# Patient Record
Sex: Male | Born: 1940 | Race: White | Hispanic: No | Marital: Married | Smoking: Former smoker
Health system: Southern US, Community
[De-identification: ages and names within clinical notes are randomized; demographics above are authoritative.]

## PROBLEM LIST (undated history)

## (undated) DIAGNOSIS — C61 Malignant neoplasm of prostate: Secondary | ICD-10-CM

## (undated) DIAGNOSIS — M353 Polymyalgia rheumatica: Secondary | ICD-10-CM

## (undated) DIAGNOSIS — I4892 Unspecified atrial flutter: Secondary | ICD-10-CM

## (undated) DIAGNOSIS — E7800 Pure hypercholesterolemia, unspecified: Secondary | ICD-10-CM

## (undated) DIAGNOSIS — I420 Dilated cardiomyopathy: Secondary | ICD-10-CM

## (undated) DIAGNOSIS — G4733 Obstructive sleep apnea (adult) (pediatric): Secondary | ICD-10-CM

## (undated) DIAGNOSIS — D693 Immune thrombocytopenic purpura: Secondary | ICD-10-CM

## (undated) HISTORY — PX: TONSILLECTOMY: SUR1361

## (undated) HISTORY — DX: Obstructive sleep apnea (adult) (pediatric): G47.33

## (undated) HISTORY — DX: Dilated cardiomyopathy: I42.0

---

## 2014-12-18 DIAGNOSIS — C61 Malignant neoplasm of prostate: Secondary | ICD-10-CM | POA: Insufficient documentation

## 2014-12-22 HISTORY — PX: PROSTATECTOMY: SHX69

## 2015-04-30 ENCOUNTER — Inpatient Hospital Stay (HOSPITAL_COMMUNITY)
Admission: EM | Admit: 2015-04-30 | Discharge: 2015-05-05 | DRG: 274 | Disposition: A | Discharge status: Home / Self Care | Payer: PRIVATE HEALTH INSURANCE | Attending: Internal Medicine | Admitting: Internal Medicine

## 2015-04-30 ENCOUNTER — Encounter (HOSPITAL_COMMUNITY): Payer: Self-pay | Admitting: Family Medicine

## 2015-04-30 ENCOUNTER — Emergency Department (HOSPITAL_COMMUNITY): Payer: PRIVATE HEALTH INSURANCE

## 2015-04-30 DIAGNOSIS — T380X5A Adverse effect of glucocorticoids and synthetic analogues, initial encounter: Secondary | ICD-10-CM | POA: Diagnosis not present

## 2015-04-30 DIAGNOSIS — Z8546 Personal history of malignant neoplasm of prostate: Secondary | ICD-10-CM

## 2015-04-30 DIAGNOSIS — Z7982 Long term (current) use of aspirin: Secondary | ICD-10-CM

## 2015-04-30 DIAGNOSIS — R739 Hyperglycemia, unspecified: Secondary | ICD-10-CM | POA: Diagnosis not present

## 2015-04-30 DIAGNOSIS — Z7952 Long term (current) use of systemic steroids: Secondary | ICD-10-CM

## 2015-04-30 DIAGNOSIS — I428 Other cardiomyopathies: Secondary | ICD-10-CM | POA: Diagnosis present

## 2015-04-30 DIAGNOSIS — I4891 Unspecified atrial fibrillation: Secondary | ICD-10-CM | POA: Diagnosis present

## 2015-04-30 DIAGNOSIS — I4892 Unspecified atrial flutter: Secondary | ICD-10-CM | POA: Diagnosis not present

## 2015-04-30 DIAGNOSIS — Z87891 Personal history of nicotine dependence: Secondary | ICD-10-CM

## 2015-04-30 DIAGNOSIS — I483 Typical atrial flutter: Secondary | ICD-10-CM | POA: Diagnosis present

## 2015-04-30 DIAGNOSIS — M353 Polymyalgia rheumatica: Secondary | ICD-10-CM | POA: Diagnosis present

## 2015-04-30 DIAGNOSIS — R000 Tachycardia, unspecified: Secondary | ICD-10-CM | POA: Diagnosis present

## 2015-04-30 DIAGNOSIS — Z79899 Other long term (current) drug therapy: Secondary | ICD-10-CM | POA: Diagnosis not present

## 2015-04-30 DIAGNOSIS — E785 Hyperlipidemia, unspecified: Secondary | ICD-10-CM | POA: Diagnosis present

## 2015-04-30 DIAGNOSIS — I5021 Acute systolic (congestive) heart failure: Secondary | ICD-10-CM | POA: Insufficient documentation

## 2015-04-30 DIAGNOSIS — E7800 Pure hypercholesterolemia, unspecified: Secondary | ICD-10-CM | POA: Diagnosis present

## 2015-04-30 HISTORY — DX: Unspecified atrial flutter: I48.92

## 2015-04-30 HISTORY — DX: Polymyalgia rheumatica: M35.3

## 2015-04-30 HISTORY — DX: Malignant neoplasm of prostate: C61

## 2015-04-30 HISTORY — DX: Pure hypercholesterolemia, unspecified: E78.00

## 2015-04-30 LAB — BASIC METABOLIC PANEL
Anion gap: 9 (ref 5–15)
BUN: 12 mg/dL (ref 6–20)
CHLORIDE: 106 mmol/L (ref 101–111)
CO2: 23 mmol/L (ref 22–32)
CREATININE: 0.99 mg/dL (ref 0.61–1.24)
Calcium: 8.9 mg/dL (ref 8.9–10.3)
Glucose, Bld: 93 mg/dL (ref 65–99)
Potassium: 4 mmol/L (ref 3.5–5.1)
SODIUM: 138 mmol/L (ref 135–145)

## 2015-04-30 LAB — CBC
HCT: 45.1 % (ref 39.0–52.0)
Hemoglobin: 15 g/dL (ref 13.0–17.0)
MCH: 29.1 pg (ref 26.0–34.0)
MCHC: 33.3 g/dL (ref 30.0–36.0)
MCV: 87.6 fL (ref 78.0–100.0)
PLATELETS: 244 10*3/uL (ref 150–400)
RBC: 5.15 MIL/uL (ref 4.22–5.81)
RDW: 14.8 % (ref 11.5–15.5)
WBC: 10.9 10*3/uL — AB (ref 4.0–10.5)

## 2015-04-30 LAB — I-STAT TROPONIN, ED: TROPONIN I, POC: 0.01 ng/mL (ref 0.00–0.08)

## 2015-04-30 LAB — TSH: TSH: 2.553 u[IU]/mL (ref 0.350–4.500)

## 2015-04-30 MED ORDER — DILTIAZEM LOAD VIA INFUSION
10.0000 mg | Freq: Once | INTRAVENOUS | Status: AC
  Administered 2015-04-30: 10 mg via INTRAVENOUS
  Filled 2015-04-30: qty 10

## 2015-04-30 MED ORDER — DILTIAZEM HCL 100 MG IV SOLR
5.0000 mg/h | INTRAVENOUS | Status: DC
  Administered 2015-04-30: 5 mg/h via INTRAVENOUS
  Administered 2015-05-01: 12.5 mg/h via INTRAVENOUS
  Administered 2015-05-01: 5 mg/h via INTRAVENOUS
  Filled 2015-04-30 (×3): qty 100

## 2015-04-30 MED ORDER — DILTIAZEM HCL 25 MG/5ML IV SOLN
15.0000 mg | Freq: Once | INTRAVENOUS | Status: AC
  Administered 2015-04-30: 15 mg via INTRAVENOUS

## 2015-04-30 MED ORDER — HEPARIN BOLUS VIA INFUSION
3500.0000 [IU] | Freq: Once | INTRAVENOUS | Status: AC
  Administered 2015-04-30: 3500 [IU] via INTRAVENOUS
  Filled 2015-04-30: qty 3500

## 2015-04-30 MED ORDER — ADENOSINE 6 MG/2ML IV SOLN
6.0000 mg | Freq: Once | INTRAVENOUS | Status: AC
  Administered 2015-04-30: 6 mg via INTRAVENOUS
  Filled 2015-04-30: qty 2

## 2015-04-30 MED ORDER — HEPARIN (PORCINE) IN NACL 100-0.45 UNIT/ML-% IJ SOLN
1000.0000 [IU]/h | INTRAMUSCULAR | Status: DC
  Administered 2015-04-30: 1000 [IU]/h via INTRAVENOUS
  Filled 2015-04-30: qty 250

## 2015-04-30 NOTE — ED Notes (Signed)
Pt here for tachycardia. Sts he went for a checkup at his doctor and that's when they found it. Denies any symptoms.

## 2015-04-30 NOTE — ED Notes (Signed)
Pt receiving heparin bolus; 4-5 second pause noted to cardiac monitor, rate in 40-50s; pt c/o dizziness. Dr.Rees at bedside; cardizem stopped per EDP. A-flutter noted with a rate 60-70s.

## 2015-04-30 NOTE — ED Provider Notes (Signed)
CSN: [id number]     Arrival date & time 04/30/15  1751 History   First MD Initiated Contact with Patient 04/30/15 1809     Chief Complaint  Patient presents with  . Tachycardia    The history is provided by the patient and the spouse. No language interpreter was used.   [name] presents for evaluation of tachycardia. He was referred to the emergency department by his primary care provider for asymptomatic tachycardia. He saw his primary care provider for routine physical was noted to have a rapid heart rate at that time and sent to the emergency department. He denies any chest pain, shortness of breath, abdominal pain, leg swelling or pain, fevers. He is diagnosed prostate cancer and underwent prostatectomy back in May and has done well postoperatively. He was also diagnosed with polymyalgia rheumatica and started on steroids 2 months ago and takes 10 mg of prednisone daily. He's been exercising this week without difficulty. He recently returned from a trip to [place] 2 weeks ago.  Past Medical History  Diagnosis Date  . Cancer   . High cholesterol    Past Surgical History  Procedure Laterality Date  . Prostatectomy     History reviewed. No pertinent family history. Social History  Substance Use Topics  . Smoking status: Never Smoker   . Smokeless tobacco: None  . Alcohol Use: Yes    Review of Systems  All other systems reviewed and are negative.     Allergies  Review of patient's allergies indicates no known allergies.  Home Medications   Prior to Admission medications   Not on File   BP 121/91 mmHg  Pulse 155  Temp(Src) 97.7 F (36.5 C) (Oral)  Resp 16  SpO2 97% Physical Exam  Constitutional: He is oriented to person, place, and time. He appears well-developed and well-nourished.  HENT:  Head: Normocephalic and atraumatic.  Cardiovascular: Regular rhythm.   No murmur heard. Tachycardic  Pulmonary/Chest: Effort normal and breath sounds normal. No  respiratory distress.  Abdominal: Soft. There is no tenderness. There is no rebound and no guarding.  Musculoskeletal: He exhibits no edema or tenderness.  Neurological: He is alert and oriented to person, place, and time.  Skin: Skin is warm and dry.  Psychiatric: He has a normal mood and affect. His behavior is normal.  Nursing note and vitals reviewed.   ED Course  Procedures (including critical care time) CRITICAL CARE Performed by: Quintella Reichert   Total critical care time: 30 mintues  Critical care time was exclusive of separately billable procedures and treating other patients.  Critical care was necessary to treat or prevent imminent or life-threatening deterioration.  Critical care was time spent personally by me on the following activities: development of treatment plan with patient and/or surrogate as well as nursing, discussions with consultants, evaluation of patient's response to treatment, examination of patient, obtaining history from patient or surrogate, ordering and performing treatments and interventions, ordering and review of laboratory studies, ordering and review of radiographic studies, pulse oximetry and re-evaluation of patient's condition.  Labs Review Labs Reviewed  CBC - Abnormal; Notable for the following:    WBC 10.9 (*)    All other components within normal limits  MRSA PCR SCREENING  BASIC METABOLIC PANEL  TSH  CBC  HEPARIN LEVEL (UNFRACTIONATED)  TROPONIN I  TROPONIN I  TROPONIN I  T4, FREE  T3, FREE  COMPREHENSIVE METABOLIC PANEL  CBC WITH DIFFERENTIAL/PLATELET  Juan Shaffer, ED    Imaging Review  Dg Chest Portable 1 View  04/30/2015   CLINICAL DATA:  Tachycardia  EXAM: PORTABLE CHEST - 1 VIEW  COMPARISON:  None.  FINDINGS: The heart size and mediastinal contours are within normal limits. Both lungs are clear. The visualized skeletal structures are unremarkable. Pacing pads overlie the chest.  IMPRESSION: No active disease.    Electronically Signed   By: Conchita Paris M.D.   On: 04/30/2015 20:02   I have personally reviewed and evaluated these images and lab results as part of my medical decision-making.   EKG Interpretation   Date/Time:  Wednesday April 30 2015 17:57:22 EDT Ventricular Rate:  153 PR Interval:    QRS Duration: 120 QT Interval:  250 QTC Calculation: 399 R Axis:   -9 Text Interpretation:  Atrial flutter Right bundle branch block Abnormal  ECG Confirmed by Juan Shaffer [phone]) on 04/30/2015 8:51:23 PM      MDM   Final diagnoses:  Atrial flutter, unspecified   Patient here for evaluation of a symptomatic tachycardia from his primary care provider's office. Initial EKG with tachycardia, thought to be a flutter given regularity. Patient given a card 6 mg IV push 1 with underlying rhythm of atrial flutter identified. He was then started on Cardizem for rate control. He was initially given a 10 mg bolus and started on a drip of 5 mg an hour. He did not have improvement in his rate at that time. The drip was increased to 10 mg an hour he was given an additional bolus of 15 mg once with an improvement in his rate to around 100. He did transiently become bradycardic to the 50s with a brief period of sinus activity and the Cardizem drip was discontinued. Discussed with Cardiologist, Dr. Claiborne Billings,  who will see the patient in consult. Discussed with the hospitalist service regarding admission for further management. Patient was started on heparin drip for possible TEE with cardioversion tomorrow.  Quintella Reichert, MD 05/01/15 [phone]

## 2015-04-30 NOTE — ED Notes (Signed)
Spoke with pharmacy unable to obtain medication out of pyxis. They will verify medication and send to POD E via tube station.

## 2015-04-30 NOTE — Consult Note (Signed)
PHARMACY CONSULT NOTE  Pharmacy Consult :  Heparin Indication : Atrial Flutter   Allergies: No Known Allergies  Heparin Dosing weight : 70.3 kg  Vital Signs: BP 114/77 mmHg  Pulse 75  Temp(Src) 97.7 F (36.5 C) (Oral)  Resp 16  Ht 5\' 9"  (1.753 m)  Wt 155 lb (70.308 kg)  BMI 22.88 kg/m2  SpO2 96%  Active Problems: Active Problems:   Atrial flutter   Labs:  Recent Labs  04/30/15 1909  HGB 15.0  HCT 45.1  PLT 244  CREATININE 0.99   No results found for: INR, PROTIME Estimated Creatinine Clearance: 64.1 mL/min (by C-G formula based on Cr of 0.99).  Medical / Surgical History: Past Medical History  Diagnosis Date  . Cancer   . High cholesterol    Past Surgical History  Procedure Laterality Date  . Prostatectomy      MEDICATION: Medication PTA: pending Scheduled:  Scheduled:  . heparin  3,500 Units Intravenous Once   Infusion[s]: Infusions:  . diltiazem (CARDIZEM) infusion 12.5 mg/hr (04/30/15 2038)  . heparin  1000 units/hr   Antibiotic[s]: Anti-infectives    None      ASSESSMENT:  74 y.o. male admitted for evaluation of tachycardia noted on routine physical exam.  Patient noted to have Atrial Flutter.  Heparin per Pharmacy Consult to be started as VTE prophylaxis   GOAL:  Heparin Level  0.3 - 0.7 units/ml   PLAN: 1. Heparin bolus 3500 units IV now, then begin Heparin infusion at 1000 units/hr.  The next Heparin Level will be drawn with the AM labs.    2. Daily Heparin level, CBC while on Heparin.  Monitor for bleeding complications. Follow Platelet counts.  Marthenia Rolling,  Pharm.D   04/30/2015,  9:35 PM

## 2015-04-30 NOTE — Consult Note (Signed)
Reason for Consult: new onset atrial flutter Primary Cardiologist: new Referring Physician: Dr. Johny Shaffer is an 74 y.o. male.  HPI: Juan Shaffer is a 74 yo man with PMH of prostate cancer s/p prostatectomy, dyslipidemia and recent diagnosis of polymyalgia rheumatica on steroids who presented to the ER after being seen by his PCP for physical with findings of tachycardia. He was having a routine physical when tachycardia noted on exam. He is asymptomatic and has not noticed limitations in his exercise ability or any chest pain or dyspnea recently. He had his prostatectomy in May and has done well. He also was diagnosed with polymyalgia rheumatica approximately 2 months ago and treated with 10 mg prednisone daily. He remains active with frequent hiking and swimming and he continues to exercise. He remains asymptomatic. We spoke at length with him, his wife and daughter about atrial fibrillation and atrial flutter and the risk of stroke with the associated conditions.   He recently travelled to [place] returned ~ 2 weeks ago.    He received diltiazem gtt and bolus in the ER and heart-rate has improved. He was also started on heparin gtt for anticogulation.     Past Medical History  Diagnosis Date  . Cancer   . High cholesterol     Past Surgical History  Procedure Laterality Date  . Prostatectomy      History reviewed. No pertinent family history. No known family history of atrial flutter or fibrillation  Social History:  reports that he has never smoked. He does not have any smokeless tobacco history on file. He reports that he drinks alcohol. He reports that he does not use illicit drugs.  Allergies: No Known Allergies  Medications:  I have reviewed the patient's current medications. Prior to Admission:  (Not in a hospital admission) Scheduled: . heparin  3,500 Units Intravenous Once    Results for orders placed or performed during the hospital encounter of  04/30/15 (from the past 48 hour(s))  I-Stat Troponin, ED (not at [facility])     Status: None   Collection Time: 04/30/15  6:59 PM  Result Value Ref Range   Troponin i, poc 0.01 0.00 - 0.08 ng/mL   Comment 3            Comment: Due to the release kinetics of cTnI, a negative result within the first hours of the onset of symptoms does not rule out myocardial infarction with certainty. If myocardial infarction is still suspected, repeat the test at appropriate intervals.   Basic metabolic panel     Status: None   Collection Time: 04/30/15  7:09 PM  Result Value Ref Range   Sodium 138 135 - 145 mmol/L   Potassium 4.0 3.5 - 5.1 mmol/L   Chloride 106 101 - 111 mmol/L   CO2 23 22 - 32 mmol/L   Glucose, Bld 93 65 - 99 mg/dL   BUN 12 6 - 20 mg/dL   Creatinine, Ser 0.99 0.61 - 1.24 mg/dL   Calcium 8.9 8.9 - 10.3 mg/dL   GFR calc non Af Amer >60 >60 mL/min   GFR calc Af Amer >60 >60 mL/min    Comment: (NOTE) The eGFR has been calculated using the CKD EPI equation. This calculation has not been validated in all clinical situations. eGFR's persistently <60 mL/min signify possible Chronic Kidney Disease.    Anion gap 9 5 - 15  CBC     Status: Abnormal   Collection Time: 04/30/15  7:09 PM  Result Value Ref Range   WBC 10.9 (H) 4.0 - 10.5 K/uL   RBC 5.15 4.22 - 5.81 MIL/uL   Hemoglobin 15.0 13.0 - 17.0 g/dL   HCT 45.1 39.0 - 52.0 %   MCV 87.6 78.0 - 100.0 fL   MCH 29.1 26.0 - 34.0 pg   MCHC 33.3 30.0 - 36.0 g/dL   RDW 14.8 11.5 - 15.5 %   Platelets 244 150 - 400 K/uL  TSH     Status: None   Collection Time: 04/30/15  7:09 PM  Result Value Ref Range   TSH 2.553 0.350 - 4.500 uIU/mL    Dg Chest Portable 1 View  04/30/2015   CLINICAL DATA:  Tachycardia  EXAM: PORTABLE CHEST - 1 VIEW  COMPARISON:  None.  FINDINGS: The heart size and mediastinal contours are within normal limits. Both lungs are clear. The visualized skeletal structures are unremarkable. Pacing pads overlie the chest.   IMPRESSION: No active disease.   Electronically Signed   By: Conchita Paris M.D.   On: 04/30/2015 20:02    Review of Systems  Constitutional: Negative for fever, chills and weight loss.  HENT: Negative for ear discharge, ear pain and tinnitus.   Eyes: Negative for double vision, photophobia and pain.  Respiratory: Negative for cough, hemoptysis and sputum production.   Gastrointestinal: Negative for nausea, vomiting and abdominal pain.  Genitourinary: Negative for dysuria, urgency and frequency.  Musculoskeletal: Negative for myalgias, back pain and neck pain.  Skin: Negative for rash.  Neurological: Negative for dizziness, tingling, tremors and headaches.  Endo/Heme/Allergies: Negative for polydipsia. Does not bruise/bleed easily.  Psychiatric/Behavioral: Negative for depression, suicidal ideas and substance abuse.   Blood pressure 114/77, pulse 75, temperature 97.7 F (36.5 C), temperature source Oral, resp. rate 16, height 5' 9" (1.753 m), weight 70.308 kg (155 lb), SpO2 96 %. Physical Exam  Nursing note and vitals reviewed. Constitutional: He is oriented to person, place, and time. He appears well-developed and well-nourished. No distress.  HENT:  Head: Normocephalic and atraumatic.  Nose: Nose normal.  Mouth/Throat: Oropharynx is clear and moist. No oropharyngeal exudate.  Eyes: Conjunctivae and EOM are normal. Pupils are equal, round, and reactive to light. No scleral icterus.  Neck: Normal range of motion. Neck supple. No JVD present. No tracheal deviation present.  Cardiovascular: Normal heart sounds and intact distal pulses.  Exam reveals no gallop.   No murmur heard. Occasionally irregular, largely regular rate/rhythm with flutter waves on monitor  Respiratory: Effort normal and breath sounds normal. No respiratory distress. He has no wheezes. He has no rales.  GI: Soft. Bowel sounds are normal. He exhibits no distension. There is no tenderness. There is no rebound.    Musculoskeletal: Normal range of motion. He exhibits no edema or tenderness.  Neurological: He is alert and oriented to person, place, and time. No cranial nerve deficit.  Skin: Skin is warm and dry. No rash noted. He is not diaphoretic. No erythema.  Psychiatric: He has a normal mood and affect. His behavior is normal. Thought content normal.   Labs reviewed; Cr 1, K 4, TSH 2.55, EKG reviewed; atrial flutter ~ 153 HR, RBBB  Assessment/Plan: Mr. Lowdermilk is a 74 yo man with PMH of prostate cancer s/p prostatectomy, dyslipidemia and recent diagnosis of polymyalgia rheumatica on steroids who presented to the ER after being seen by his PCP for physical with findings of tachycardia. He has atrial flutter. Triggers and etiology for atrial flutter CTI typical flutter, post-surgical left-sided  flutter, triggers of infection, heart failure, thyroid disease, ischemia, medications/drugs/toxins among many other etiologies. He is being rate controlled with diltiazem now and anticoagulated with heparin. Will plan for TEE-DCCV in AM and have EP see him for possible ablation to prevent recurrence.  1. Atrial flutter with RVR: anticoagulation with heparin gtt, rate control with diltiazem. Likely will pursue TEE-DCCV in AM.  - Keep NPO after MN. Evaluate for triggers - TSH, BNP, echocardiogram, trend cardiac biomarkers - discuss with EP  2. Dyslipidemia: continue asa and atorvastatin 3. PMR: appreciate Hospitalist team 4. S/p Prostatectomy: appreciate hospitalist team  Jules Husbands 04/30/2015, 9:34 PM

## 2015-04-30 NOTE — ED Notes (Addendum)
Pt c/o tachycardia after being sent by PCP. Pt reports having a wellness visit schedule today, staff noticed pt's heart rate to be in the 150s and referred pt to ED. Pt reports having a prostatectomy in May and has been taking a "low dose of cialis;" denies any new medications

## 2015-04-30 NOTE — ED Notes (Signed)
Pt stood to use urinal; no change in heart rate; denies dizziness, chest pain, or shortness of breath

## 2015-04-30 NOTE — ED Notes (Signed)
Attempted report 

## 2015-04-30 NOTE — ED Notes (Signed)
Patient placed on zoll  

## 2015-04-30 NOTE — ED Notes (Signed)
Cardiologist at bedside.  

## 2015-04-30 NOTE — ED Notes (Signed)
15 mg bolus given of cardizem; heart rate noted to be irregular with a rate of 100-110

## 2015-05-01 ENCOUNTER — Encounter (HOSPITAL_COMMUNITY): Payer: Self-pay | Admitting: General Practice

## 2015-05-01 ENCOUNTER — Inpatient Hospital Stay (HOSPITAL_COMMUNITY): Payer: PRIVATE HEALTH INSURANCE

## 2015-05-01 ENCOUNTER — Other Ambulatory Visit (HOSPITAL_COMMUNITY): Payer: PRIVATE HEALTH INSURANCE

## 2015-05-01 DIAGNOSIS — M353 Polymyalgia rheumatica: Secondary | ICD-10-CM | POA: Diagnosis present

## 2015-05-01 DIAGNOSIS — Z8546 Personal history of malignant neoplasm of prostate: Secondary | ICD-10-CM

## 2015-05-01 DIAGNOSIS — I4892 Unspecified atrial flutter: Secondary | ICD-10-CM

## 2015-05-01 DIAGNOSIS — E7800 Pure hypercholesterolemia, unspecified: Secondary | ICD-10-CM | POA: Diagnosis present

## 2015-05-01 DIAGNOSIS — E785 Hyperlipidemia, unspecified: Secondary | ICD-10-CM

## 2015-05-01 LAB — CBC WITH DIFFERENTIAL/PLATELET
BASOS PCT: 0 % (ref 0–1)
Basophils Absolute: 0 10*3/uL (ref 0.0–0.1)
EOS ABS: 0.1 10*3/uL (ref 0.0–0.7)
EOS PCT: 1 % (ref 0–5)
HCT: 44.7 % (ref 39.0–52.0)
Hemoglobin: 14.8 g/dL (ref 13.0–17.0)
LYMPHS ABS: 1 10*3/uL (ref 0.7–4.0)
Lymphocytes Relative: 9 % — ABNORMAL LOW (ref 12–46)
MCH: 29.2 pg (ref 26.0–34.0)
MCHC: 33.1 g/dL (ref 30.0–36.0)
MCV: 88.2 fL (ref 78.0–100.0)
MONO ABS: 0.7 10*3/uL (ref 0.1–1.0)
MONOS PCT: 7 % (ref 3–12)
NEUTROS PCT: 83 % — AB (ref 43–77)
Neutro Abs: 9 10*3/uL — ABNORMAL HIGH (ref 1.7–7.7)
PLATELETS: 241 10*3/uL (ref 150–400)
RBC: 5.07 MIL/uL (ref 4.22–5.81)
RDW: 15 % (ref 11.5–15.5)
WBC: 10.9 10*3/uL — ABNORMAL HIGH (ref 4.0–10.5)

## 2015-05-01 LAB — HEPARIN LEVEL (UNFRACTIONATED): Heparin Unfractionated: 0.57 IU/mL (ref 0.30–0.70)

## 2015-05-01 LAB — TROPONIN I
TROPONIN I: 0.03 ng/mL (ref <0.031)
Troponin I: <0.03 ng/mL (ref <0.031)
Troponin I: <0.03 ng/mL (ref <0.031)

## 2015-05-01 LAB — COMPREHENSIVE METABOLIC PANEL
ALK PHOS: 45 U/L (ref 38–126)
ALT: 31 U/L (ref 17–63)
AST: 19 U/L (ref 15–41)
Albumin: 3.1 g/dL — ABNORMAL LOW (ref 3.5–5.0)
Anion gap: 9 (ref 5–15)
BUN: 9 mg/dL (ref 6–20)
CALCIUM: 8.2 mg/dL — AB (ref 8.9–10.3)
CHLORIDE: 106 mmol/L (ref 101–111)
CO2: 24 mmol/L (ref 22–32)
CREATININE: 0.95 mg/dL (ref 0.61–1.24)
GFR calc non Af Amer: >60 mL/min (ref >60)
GLUCOSE: 109 mg/dL — AB (ref 65–99)
Potassium: 3.7 mmol/L (ref 3.5–5.1)
SODIUM: 139 mmol/L (ref 135–145)
Total Bilirubin: 1 mg/dL (ref 0.3–1.2)
Total Protein: 5.6 g/dL — ABNORMAL LOW (ref 6.5–8.1)

## 2015-05-01 LAB — GLUCOSE, CAPILLARY
GLUCOSE-CAPILLARY: 110 mg/dL — AB (ref 65–99)
Glucose-Capillary: 187 mg/dL — ABNORMAL HIGH (ref 65–99)
Glucose-Capillary: 167 mg/dL — ABNORMAL HIGH (ref 65–99)

## 2015-05-01 LAB — MRSA PCR SCREENING: MRSA BY PCR: NEGATIVE

## 2015-05-01 LAB — T4, FREE: Free T4: 1.05 ng/dL (ref 0.61–1.12)

## 2015-05-01 MED ORDER — THIAMINE HCL 100 MG/ML IJ SOLN: 100.0000 mg | Freq: Every day | INTRAMUSCULAR | Status: DC

## 2015-05-01 MED ORDER — LORAZEPAM 2 MG/ML IJ SOLN: 1.0000 mg | Freq: Four times a day (QID) | INTRAMUSCULAR | Status: DC | PRN

## 2015-05-01 MED ORDER — SODIUM CHLORIDE 0.9 % IJ SOLN: 3.0000 mL | Freq: Two times a day (BID) | INTRAMUSCULAR | Status: DC

## 2015-05-01 MED ORDER — LORAZEPAM 1 MG PO TABS: 1.0000 mg | ORAL_TABLET | Freq: Four times a day (QID) | ORAL | Status: DC | PRN

## 2015-05-01 MED ORDER — VITAMIN B-1 100 MG PO TABS
100.0000 mg | ORAL_TABLET | Freq: Every day | ORAL | Status: DC
  Administered 2015-05-01: 100 mg via ORAL
  Filled 2015-05-01: qty 1

## 2015-05-01 MED ORDER — RIVAROXABAN 20 MG PO TABS
20.0000 mg | ORAL_TABLET | ORAL | Status: AC
  Administered 2015-05-01: 20 mg via ORAL
  Filled 2015-05-01: qty 1

## 2015-05-01 MED ORDER — DILTIAZEM HCL 60 MG PO TABS
60.0000 mg | ORAL_TABLET | Freq: Three times a day (TID) | ORAL | Status: DC
  Administered 2015-05-01 – 2015-05-02 (×2): 60 mg via ORAL
  Filled 2015-05-01 (×2): qty 1

## 2015-05-01 MED ORDER — SODIUM CHLORIDE 0.9 % IV SOLN
INTRAVENOUS | Status: DC
Start: 2015-05-01 — End: 2015-05-05

## 2015-05-01 MED ORDER — ONDANSETRON HCL 4 MG PO TABS: 4.0000 mg | ORAL_TABLET | Freq: Four times a day (QID) | ORAL | Status: DC | PRN

## 2015-05-01 MED ORDER — ADULT MULTIVITAMIN W/MINERALS CH
1.0000 | ORAL_TABLET | Freq: Every day | ORAL | Status: DC
  Administered 2015-05-01 – 2015-05-04 (×4): 1 via ORAL
  Filled 2015-05-01 (×4): qty 1

## 2015-05-01 MED ORDER — ACETAMINOPHEN 650 MG RE SUPP: 650.0000 mg | Freq: Four times a day (QID) | RECTAL | Status: DC | PRN

## 2015-05-01 MED ORDER — SODIUM CHLORIDE 0.9 % IJ SOLN
3.0000 mL | Freq: Two times a day (BID) | INTRAMUSCULAR | Status: DC
  Administered 2015-05-01 – 2015-05-04 (×7): 3 mL via INTRAVENOUS

## 2015-05-01 MED ORDER — MORPHINE SULFATE (PF) 2 MG/ML IV SOLN: 1.0000 mg | INTRAVENOUS | Status: DC | PRN

## 2015-05-01 MED ORDER — HYDROCORTISONE NA SUCCINATE PF 100 MG IJ SOLR
50.0000 mg | Freq: Three times a day (TID) | INTRAMUSCULAR | Status: DC
  Administered 2015-05-01 – 2015-05-02 (×4): 50 mg via INTRAVENOUS
  Filled 2015-05-01 (×4): qty 2

## 2015-05-01 MED ORDER — SODIUM CHLORIDE 0.9 % IV SOLN
INTRAVENOUS | Status: DC
  Administered 2015-05-01: 1000 mL via INTRAVENOUS

## 2015-05-01 MED ORDER — ONDANSETRON HCL 4 MG/2ML IJ SOLN: 4.0000 mg | Freq: Four times a day (QID) | INTRAMUSCULAR | Status: DC | PRN

## 2015-05-01 MED ORDER — SODIUM CHLORIDE 0.9 % IJ SOLN: 3.0000 mL | INTRAMUSCULAR | Status: DC | PRN

## 2015-05-01 MED ORDER — FOLIC ACID 1 MG PO TABS
1.0000 mg | ORAL_TABLET | Freq: Every day | ORAL | Status: DC
  Administered 2015-05-01: 1 mg via ORAL
  Filled 2015-05-01: qty 1

## 2015-05-01 MED ORDER — SODIUM CHLORIDE 0.9 % IV SOLN: 250.0000 mL | INTRAVENOUS | Status: DC

## 2015-05-01 MED ORDER — RIVAROXABAN 20 MG PO TABS
20.0000 mg | ORAL_TABLET | Freq: Every day | ORAL | Status: DC
  Administered 2015-05-02 – 2015-05-03 (×2): 20 mg via ORAL
  Filled 2015-05-01 (×2): qty 1

## 2015-05-01 MED ORDER — ACETAMINOPHEN 325 MG PO TABS
650.0000 mg | ORAL_TABLET | Freq: Four times a day (QID) | ORAL | Status: DC | PRN
Start: 2015-05-01 — End: 2015-05-06

## 2015-05-01 MED ORDER — RIVAROXABAN 20 MG PO TABS: 20.0000 mg | ORAL_TABLET | Freq: Every day | ORAL | Status: DC

## 2015-05-01 NOTE — H&P (Signed)
[facility] History and Physical  Juan Shaffer OFB:[id number] DOB: 1940/10/19 DOA: 04/30/2015  Referring physician: Dr. Ralene Bathe. PCP: Velna Ochs, MD  Specialists: Dr. Charlestine Night. Rheumatologist.  Chief Complaint: Referred to the ER for tachycardia.  HPI: Juan Shaffer is a 74 y.o. male with history of polymyalgia rheumatica recently diagnosed and is being placed on prednisone, prostate cancer status post surgery in May 2016, hyperlipidemia was referred to the ER patient had followed up with his PCP for routine checkup. At the clinic patient was found to be tachycardic with rate around 150 bpm. Patient was found to be in atrial flutter with RVR. Patient was started on Cardizem infusion for which patient's heart rate improved. On-call cardiologist Dr. Christa See was consulted. Patient was started on heparin infusion and admitted for further management. Patient denies any palpitations chest pain shortness of breath. Patient states 2 weeks ago when he was traveling to [place] he felt like his heart was racing. Denies any nausea vomiting abdominal pain diarrhea fever chills productive cough.   Review of Systems: As presented in the history of presenting illness, rest negative.  Past Medical History  Diagnosis Date  . High cholesterol   . Polymyalgia rheumatica   . Prostate cancer    Past Surgical History  Procedure Laterality Date  . Prostatectomy  12/2014  . Tonsillectomy  ~ 1946   Social History:  reports that he quit smoking about 47 years ago. His smoking use included Cigarettes. He has a 8 pack-year smoking history. He has never used smokeless tobacco. He reports that he drinks about 3.6 oz of alcohol per week. He reports that he does not use illicit drugs. Where does patient live at home. Can patient participate in ADLs? Yes.  No Known Allergies  Family History: History reviewed. No pertinent family history. history of hypertension.   Prior to Admission medications    Medication Sig Start Date End Date Taking? Authorizing Provider  alendronate (FOSAMAX) 70 MG tablet Take 70 mg by mouth once a week. Take with a full glass of water on an empty stomach. Sundays (Days vary per patient). He did states he taken it yesterday (on a Tuesday) per patient   Yes Historical Provider, MD  aspirin 81 MG tablet Take 81 mg by mouth daily.   Yes Historical Provider, MD  atorvastatin (LIPITOR) 20 MG tablet Take 20 mg by mouth every evening.    Yes Historical Provider, MD  calcium carbonate (TUMS - DOSED IN MG ELEMENTAL CALCIUM) 500 MG chewable tablet Chew 1 tablet by mouth 2 (two) times daily.   Yes Historical Provider, MD  Cholecalciferol (VITAMIN D-3 PO) Take 1 tablet by mouth daily.   Yes Historical Provider, MD  clobetasol cream (TEMOVATE) 7.82 % Apply 1 application topically 2 (two) times daily.   Yes Historical Provider, MD  glucosamine-chondroitin 500-400 MG tablet Take 1 tablet by mouth daily.   Yes Historical Provider, MD  ibuprofen (ADVIL,MOTRIN) 600 MG tablet Take 600 mg by mouth every 6 (six) hours as needed for moderate pain.   Yes Historical Provider, MD  omega-3 acid ethyl esters (LOVAZA) 1 G capsule Take 1 g by mouth daily.   Yes Historical Provider, MD  oxyCODONE-acetaminophen (PERCOCET/ROXICET) 5-325 MG per tablet Take 1 tablet by mouth every 4 (four) hours as needed for moderate pain or severe pain.   Yes Historical Provider, MD  predniSONE (DELTASONE) 5 MG tablet Take 5 mg by mouth daily with breakfast.   Yes Historical Provider, MD  tadalafil (CIALIS)  5 MG tablet Take 5 mg by mouth daily. Take every day per patient   Yes Historical Provider, MD    Physical Exam: Filed Vitals:   04/30/15 2242 04/30/15 2300 04/30/15 2320 04/30/15 2346  BP: 112/66 112/67 132/76 123/68  Pulse: 67 75 75 57  Temp:    97.6 F (36.4 C)  TempSrc:    Oral  Resp: 16 12 14 16   Height:    5\' 9"  (1.753 m)  Weight:    71.215 kg (157 lb)  SpO2: 98% 98% 97% 98%     General:   Moderately built and nourished.  Eyes: Anicteric no pallor.  ENT: No discharge from the ears eyes nose and mouth.  Neck: No mass felt. No JVD appreciated.  Cardiovascular: S1 and S2 heard.  Respiratory: No rhonchi or crepitations.  Abdomen: Soft nontender bowel sounds present.  Skin: No rash.  Musculoskeletal: No edema.  Psychiatric: Appears normal.  Neurologic: Alert awake oriented to time place and person. Moves all extremities.  Labs on Admission:  Basic Metabolic Panel:  Recent Labs Lab 04/30/15 1909  NA 138  K 4.0  CL 106  CO2 23  GLUCOSE 93  BUN 12  CREATININE 0.99  CALCIUM 8.9   Liver Function Tests: No results for input(s): AST, ALT, ALKPHOS, BILITOT, PROT, ALBUMIN in the last 168 hours. No results for input(s): LIPASE, AMYLASE in the last 168 hours. No results for input(s): AMMONIA in the last 168 hours. CBC:  Recent Labs Lab 04/30/15 1909  WBC 10.9*  HGB 15.0  HCT 45.1  MCV 87.6  PLT 244   Cardiac Enzymes: No results for input(s): CKTOTAL, CKMB, CKMBINDEX, TROPONINI in the last 168 hours.  BNP (last 3 results) No results for input(s): BNP in the last 8760 hours.  ProBNP (last 3 results) No results for input(s): PROBNP in the last 8760 hours.  CBG: No results for input(s): GLUCAP in the last 168 hours.  Radiological Exams on Admission: Dg Chest Portable 1 View  04/30/2015   CLINICAL DATA:  Tachycardia  EXAM: PORTABLE CHEST - 1 VIEW  COMPARISON:  None.  FINDINGS: The heart size and mediastinal contours are within normal limits. Both lungs are clear. The visualized skeletal structures are unremarkable. Pacing pads overlie the chest.  IMPRESSION: No active disease.   Electronically Signed   By: Conchita Paris M.D.   On: 04/30/2015 20:02    EKG: Independently reviewed. Atrial flutter with RVR.  Assessment/Plan Principal Problem:   Atrial flutter with rapid ventricular response Active Problems:   Atrial flutter   Polymyalgia rheumatica    History of prostate cancer   Hyperlipidemia   1. Atrial flutter with RVR - appreciate cardiology consult. Patient started at this time is being controlled on Cardizem infusion which will be continued. As per cardiology recommendation patient has been kept nothing by mouth past midnight for possible cardiac procedure. Continue heparin infusion. Cycle cardiac markers and check thyroid function tests. Check 2-D echo. Chads 2 vasc score is 2. 2. Polymyalgia rheumatica - since patient will be nothing by mouth I have placed patient on IV hydrocortisone for now. Change back to prednisone once patient can take orally. 3. History of hyperlipidemia on statins. 4. History of prostate cancer status post surgery.  Patient states he drinks wine and liquor alternate days. Will keep on when necessary Ativan.  I have personally reviewed patient's chest x-ray and EKG. I have reviewed cardiology consult and their recommendations.   DVT Prophylaxis heparin infusion.  Code  Status: Full code.  Family Communication: Discussed with patient.  Disposition Plan: Admit to inpatient.    KAKRAKANDY,ARSHAD N. [facility] Pager [phone].  If 7PM-7AM, please contact night-coverage [email or web address] Password [email or web address] 05/01/2015, 1:03 AM

## 2015-05-01 NOTE — Progress Notes (Signed)
Patient admitted after midnight, please see H&P.  Cards consult for new a fib/flutter  Eulogio Bear DO

## 2015-05-01 NOTE — Progress Notes (Signed)
  Echocardiogram 2D Echocardiogram has been performed.  Juan Shaffer 05/01/2015, 9:56 AM

## 2015-05-01 NOTE — Care Management Note (Addendum)
Case Management Note  Patient Details  Name: Juan Shaffer MRN: [id number] Date of Birth: [birth year]  Subjective/Objective: Pt admitted for A Fib RVR. Initiated on IV Cardizem gtt.                   Action/Plan: CM will continue to monitor for medication assistance.    Expected Discharge Date:                  Expected Discharge Plan:  Home/Self Care  In-House Referral:     Discharge planning Services  CM Consult, Medication Assistance  Post Acute Care Choice:    Choice offered to:     DME Arranged:    DME Agency:     HH Arranged:    HH Agency:     Status of Service:  In process, will continue to follow  Medicare Important Message Given:    Date Medicare IM Given:    Medicare IM give by:    Date Additional Medicare IM Given:    Additional Medicare Important Message give by:     If discussed at [place] of Stay Meetings, dates discussed:    Additional Comments: [place] 05-02-15  Jacqlyn Krauss, RN,BSN [phone] Prior authorization completed. CM did provide co pay to pt. He has $100.00 deductible and co pay will range from zero to $45.00. Pt is aware. No further needs from CM at this time.   1116 05-02-15 Jacqlyn Krauss, [place] [phone] Xarelto:Xarelto: Needs Prior Approval - MD please [id number] option 2 then option 1Price can then be quoted to MD. CM did provide pt with 30 day free card for Xarelto. Pt will need Rx for 30 day free no refills. CM will call [facility] on [address] to make sure medication is available-medication is available. No further needs from CM at this time.

## 2015-05-01 NOTE — Progress Notes (Signed)
ANTICOAGULATION CONSULT NOTE - Follow Up Consult  Pharmacy Consult for Heparin Indication: atrial flutter  No Known Allergies  Patient Measurements: Height: 5\' 9"  (175.3 cm) Weight: 157 lb (71.215 kg) IBW/kg (Calculated) : 70.7  Vital Signs: Temp: 97.5 F (36.4 C) (09/08 0345) Temp Source: Oral (09/08 0345) BP: 121/83 mmHg (09/08 0645) Pulse Rate: 152 (09/08 0645)  Labs:  Recent Labs  04/30/15 1909 05/01/15 0310 05/01/15 0516  HGB 15.0  --  14.8  HCT 45.1  --  44.7  PLT 244  --  241  HEPARINUNFRC  --   --  0.57  CREATININE 0.99  --   --   TROPONINI  --  <0.03  --     Estimated Creatinine Clearance: 64.5 mL/min (by C-G formula based on Cr of 0.99).   Assessment: 74 y.o. male on heparin for atrial flutter. Heparin level therapeutic (0.57) on 1000 units/hr. No bleeding noted.  Goal of Therapy:  Heparin level 0.3-0.7 units/ml Monitor platelets by anticoagulation protocol: Yes   Plan:  Continue heparin at 1000 units/hr Will f/u 6 hr level to confirm therapeutic  Sherlon Handing, PharmD, BCPS Clinical pharmacist, pager [phone] 05/01/2015,7:00 AM

## 2015-05-01 NOTE — Progress Notes (Signed)
Pt educated and has refused to have bed alarm placed on overnight. Will continue to monitor closely. 

## 2015-05-01 NOTE — Progress Notes (Signed)
Utilization review completed. Krissy Orebaugh, RN, BSN. 

## 2015-05-01 NOTE — Progress Notes (Signed)
Patient Name: Juan Shaffer Date of Encounter: 05/01/2015  Principal Problem:   Atrial flutter with rapid ventricular response Active Problems:   Atrial flutter   Polymyalgia rheumatica   History of prostate cancer   Hyperlipidemia     Primary Cardiologist: New Patient Profile: 74yo male w/ PMH of prostate cancer (s/p prostatectomy), HLD, and PMR (on daily steroids) admitted on 04/30/2015 for Atrial Flutter w/ RVR.  SUBJECTIVE: Feeling well. Denies any chest pain, palpitations, or shortness of breath.  OBJECTIVE Filed Vitals:   05/01/15 0345 05/01/15 0445 05/01/15 0545 05/01/15 0645  BP: 115/58 110/70 127/76 121/83  Pulse: 76 38 76 152  Temp: 97.5 F (36.4 C)     TempSrc: Oral     Resp: 12 18 17 14   Height:      Weight: 157 lb (71.215 kg)     SpO2: 98% 96% 96% 94%    Intake/Output Summary (Last 24 hours) at 05/01/15 0858 Last data filed at 05/01/15 0403  Gross per 24 hour  Intake    170 ml  Output   1350 ml  Net  -1180 ml   Filed Weights   04/30/15 2035 04/30/15 2346 05/01/15 0345  Weight: 155 lb (70.308 kg) 157 lb (71.215 kg) 157 lb (71.215 kg)    PHYSICAL EXAM General: Well developed, well nourished, pleasant male in no acute distress. Head: Normocephalic, atraumatic.  Neck: Supple without bruits, JVD not elevated. Lungs:  Resp regular and unlabored, CTA without wheezing or rales. Heart: Irregularly irregular, no S3, S4, or murmur; no rub. Abdomen: Soft, non-tender, non-distended with normoactive bowel sounds. No hepatomegaly. No rebound/guarding. No obvious abdominal masses. Extremities: No clubbing, cyanosis, or edema. Distal pedal pulses are 2+ bilaterally. Neuro: Alert and oriented X 3. Moves all extremities spontaneously. Psych: Normal affect.   LABS: CBC: Recent Labs  04/30/15 1909 05/01/15 0516  WBC 10.9* 10.9*  NEUTROABS  --  9.0*  HGB 15.0 14.8  HCT 45.1 44.7  MCV 87.6 88.2  PLT 244 875   Basic Metabolic Panel: Recent Labs  04/30/15 1909 05/01/15 0516  NA 138 139  K 4.0 3.7  CL 106 106  CO2 23 24  GLUCOSE 93 109*  BUN 12 9  CREATININE 0.99 0.95  CALCIUM 8.9 8.2*   Liver Function Tests: Recent Labs  05/01/15 0516  AST 19  ALT 31  ALKPHOS 45  BILITOT 1.0  PROT 5.6*  ALBUMIN 3.1*   Cardiac Enzymes: Recent Labs  05/01/15 0310 05/01/15 0516  TROPONINI <0.03 0.03    Recent Labs  04/30/15 1859  TROPIPOC 0.01   Thyroid Function Tests: Recent Labs  04/30/15 1909  TSH 2.553    TELE:  Atrial Flutter with rates between 80 - 150bpm.      ECG: Atrial flutter with rate in 110's.  ECHO: Pending  Radiology/Studies: Dg Chest Portable 1 View: 04/30/2015   CLINICAL DATA:  Tachycardia  EXAM: PORTABLE CHEST - 1 VIEW  COMPARISON:  None.  FINDINGS: The heart size and mediastinal contours are within normal limits. Both lungs are clear. The visualized skeletal structures are unremarkable. Pacing pads overlie the chest.  IMPRESSION: No active disease.   Electronically Signed   By: Conchita Paris M.D.   On: 04/30/2015 20:02     Current Medications:  . folic acid  1 mg Oral Daily  . hydrocortisone sod succinate (SOLU-CORTEF) inj  50 mg Intravenous Q8H  . multivitamin with minerals  1 tablet Oral Daily  . thiamine  100 mg Oral Daily   Or  . thiamine  100 mg Intravenous Daily   . sodium chloride 10 mL/hr at 05/01/15 0102  . diltiazem (CARDIZEM) infusion 10 mg/hr (05/01/15 0810)  . heparin 1,000 Units/hr (04/30/15 2230)    ASSESSMENT AND PLAN:  1. Atrial flutter with rapid ventricular response - history of tachycardia diagnosed by PCP but has remained asymptomatic. TSH and Troponin values have been normal. - echocardiogram is pending. - currently on Diltiazem for rate control and Heparin drip for anticoagulation. - Scheduled for TEE/DCCV with Dr. Harrington Challenger on 05/02/2015 at 8:00AM. The risks and benefits of transesophageal echocardiogram have been explained including risks of esophageal damage,  perforation (1:10,000 risk), bleeding, pharyngeal hematoma as well as other potential complications associated with conscious sedation including aspiration, arrhythmia, respiratory failure and death. Alternatives to treatment were discussed, questions were answered. Patient is willing to proceed.   2. Polymyalgia rheumatica - per IM    3. History of prostate cancer - s/p prostatectomy 12/2014.  4. Hyperlipidemia - continue statin  Franchot Heidelberg , PA-C 8:58 AM 05/01/2015 Pager: [phone]

## 2015-05-01 NOTE — Progress Notes (Signed)
Pt cardizem restarted at 5mg /hr upon arrival to floor. Pt in A.flutter with rates in 70's while lying in bed. Heartrate rises to 150s a.flutter when pt stands at bedside to use urinal. Pt does not feel difference in heartrates. Will continue to monitor closely.

## 2015-05-02 ENCOUNTER — Encounter (HOSPITAL_COMMUNITY): Payer: Self-pay | Admitting: Student

## 2015-05-02 ENCOUNTER — Encounter (HOSPITAL_COMMUNITY)
Admission: EM | Disposition: A | Discharge status: Home / Self Care | Payer: Self-pay | Source: Home / Self Care | Attending: Internal Medicine

## 2015-05-02 DIAGNOSIS — M353 Polymyalgia rheumatica: Secondary | ICD-10-CM

## 2015-05-02 LAB — T3, FREE: T3, Free: 3.3 pg/mL (ref 2.0–4.4)

## 2015-05-02 LAB — CBC
HEMATOCRIT: 41.2 % (ref 39.0–52.0)
HEMOGLOBIN: 13.7 g/dL (ref 13.0–17.0)
MCH: 29.3 pg (ref 26.0–34.0)
MCHC: 33.3 g/dL (ref 30.0–36.0)
MCV: 88 fL (ref 78.0–100.0)
PLATELETS: 207 10*3/uL (ref 150–400)
RBC: 4.68 MIL/uL (ref 4.22–5.81)
RDW: 15.2 % (ref 11.5–15.5)
WBC: 13.8 10*3/uL — AB (ref 4.0–10.5)

## 2015-05-02 LAB — GLUCOSE, CAPILLARY
GLUCOSE-CAPILLARY: 114 mg/dL — AB (ref 65–99)
GLUCOSE-CAPILLARY: 162 mg/dL — AB (ref 65–99)
Glucose-Capillary: 136 mg/dL — ABNORMAL HIGH (ref 65–99)
Glucose-Capillary: 160 mg/dL — ABNORMAL HIGH (ref 65–99)

## 2015-05-02 SURGERY — ECHOCARDIOGRAM, TRANSESOPHAGEAL
Anesthesia: Monitor Anesthesia Care

## 2015-05-02 MED ORDER — METOPROLOL TARTRATE 12.5 MG HALF TABLET: 12.5000 mg | ORAL_TABLET | Freq: Two times a day (BID) | ORAL | Status: DC

## 2015-05-02 MED ORDER — HYDROCORTISONE NA SUCCINATE PF 100 MG IJ SOLR: 50.0000 mg | Freq: Two times a day (BID) | INTRAMUSCULAR | Status: DC

## 2015-05-02 MED ORDER — METOPROLOL TARTRATE 1 MG/ML IV SOLN
5.0000 mg | Freq: Once | INTRAVENOUS | Status: AC
  Administered 2015-05-02: 5 mg via INTRAVENOUS
  Filled 2015-05-02: qty 5

## 2015-05-02 MED ORDER — PREDNISONE 20 MG PO TABS
40.0000 mg | ORAL_TABLET | Freq: Every day | ORAL | Status: DC
  Administered 2015-05-02 – 2015-05-05 (×4): 40 mg via ORAL
  Filled 2015-05-02 (×4): qty 2

## 2015-05-02 MED ORDER — METOPROLOL TARTRATE 12.5 MG HALF TABLET
12.5000 mg | ORAL_TABLET | Freq: Two times a day (BID) | ORAL | Status: DC
  Administered 2015-05-02 – 2015-05-03 (×3): 12.5 mg via ORAL
  Filled 2015-05-02 (×3): qty 1

## 2015-05-02 MED ORDER — ATORVASTATIN CALCIUM 20 MG PO TABS
20.0000 mg | ORAL_TABLET | Freq: Every day | ORAL | Status: DC
  Administered 2015-05-03 – 2015-05-05 (×4): 20 mg via ORAL
  Filled 2015-05-02 (×4): qty 1

## 2015-05-02 NOTE — Progress Notes (Signed)
Notified Dr. Eliseo Squires pt back in [place] 130'-140's. Await cardiology input. Carroll Kinds RN

## 2015-05-02 NOTE — Progress Notes (Signed)
Patient Name: Juan Shaffer Date of Encounter: 05/02/2015  Principal Problem:   Atrial flutter with rapid ventricular response Active Problems:   Atrial flutter   Polymyalgia rheumatica   History of prostate cancer   Hyperlipidemia     Primary Cardiologist: New - Dr. Debara Pickett Patient Profile: 74yo male w/ PMH of prostate cancer (s/p prostatectomy), HLD, and PMR (on daily steroids) admitted on 04/30/2015 for Atrial Flutter w/ RVR. Converted back to sinus rhythm around 1600 on 05/01/2015.  SUBJECTIVE: Feeling well. Denies any chest pain, shortness of breath, or palpitations.  OBJECTIVE Filed Vitals:   05/02/15 0310 05/02/15 0505 05/02/15 0605 05/02/15 0607  BP:  110/50    Pulse: 71 60    Temp:   97.3 F (36.3 C)   TempSrc:   Oral   Resp:  15    Height:      Weight:    156 lb 11.2 oz (71.079 kg)  SpO2:  95%      Intake/Output Summary (Last 24 hours) at 05/02/15 0822 Last data filed at 05/02/15 7510  Gross per 24 hour  Intake   1080 ml  Output   1575 ml  Net   -495 ml   Filed Weights   04/30/15 2346 05/01/15 0345 05/02/15 0607  Weight: 157 lb (71.215 kg) 157 lb (71.215 kg) 156 lb 11.2 oz (71.079 kg)    PHYSICAL EXAM General: Well developed, well nourished, male in no acute distress. Head: Normocephalic, atraumatic.  Neck: Supple without bruits, JVD not elevated. Lungs:  Resp regular and unlabored, CTA without wheezing or rales. Heart: RRR, S1, S2, no S3, S4, or murmur; no rub. Abdomen: Soft, non-tender, non-distended with normoactive bowel sounds. No hepatomegaly. No rebound/guarding. No obvious abdominal masses. Extremities: No clubbing, cyanosis, or edema. Distal pedal pulses are 2+ bilaterally. Neuro: Alert and oriented X 3. Moves all extremities spontaneously. Psych: Normal affect.  LABS: CBC:  Recent Labs  05/01/15 0516 05/02/15 0526  WBC 10.9* 13.8*  NEUTROABS 9.0*  --   HGB 14.8 13.7  HCT 44.7 41.2  MCV 88.2 88.0  PLT 241 258   Basic Metabolic  Panel:  Recent Labs  04/30/15 1909 05/01/15 0516  NA 138 139  K 4.0 3.7  CL 106 106  CO2 23 24  GLUCOSE 93 109*  BUN 12 9  CREATININE 0.99 0.95  CALCIUM 8.9 8.2*   Liver Function Tests:  Recent Labs  05/01/15 0516  AST 19  ALT 31  ALKPHOS 45  BILITOT 1.0  PROT 5.6*  ALBUMIN 3.1*   Cardiac Enzymes:  Recent Labs  05/01/15 0310 05/01/15 0516 05/01/15 1240  TROPONINI <0.03 0.03 <0.03    Recent Labs  04/30/15 1859  TROPIPOC 0.01   Thyroid Function Tests:  Recent Labs  04/30/15 1909 05/01/15 0310  TSH 2.553  --   T3FREE  --  3.3    TELE: Converted from Atrial Flutter to NSR around 1600 on 05/01/2015. Has been in NSR since with rates now in 60's -70's.      ECG: NSR with rate in 60's.   ECHO: Study Conclusions - Left ventricle: The cavity size was normal. Wall thickness was increased in a pattern of moderate LVH. Systolic function was mildly reduced. The estimated ejection fraction was in the range of 45% to 50%. Wall motion was normal; there were no regional wall motion abnormalities. - Pericardium, extracardiac: A trivial pericardial effusion was identified.   Radiology/Studies: Dg Chest Portable 1 View: 04/30/2015  CLINICAL DATA:  Tachycardia  EXAM: PORTABLE CHEST - 1 VIEW  COMPARISON:  None.  FINDINGS: The heart size and mediastinal contours are within normal limits. Both lungs are clear. The visualized skeletal structures are unremarkable. Pacing pads overlie the chest.  IMPRESSION: No active disease.   Electronically Signed   By: Conchita Paris M.D.   On: 04/30/2015 20:02     Current Medications:  . diltiazem  60 mg Oral 3 times per day  . folic acid  1 mg Oral Daily  . hydrocortisone sod succinate (SOLU-CORTEF) inj  50 mg Intravenous Q8H  . multivitamin with minerals  1 tablet Oral Daily  . rivaroxaban  20 mg Oral Daily  . sodium chloride  3 mL Intravenous Q12H  . thiamine  100 mg Oral Daily   Or  . thiamine  100 mg Intravenous  Daily   . sodium chloride 1,000 mL (05/01/15 1412)  . sodium chloride    . sodium chloride    . diltiazem (CARDIZEM) infusion Stopped (05/01/15 2145)    ASSESSMENT AND PLAN: 1. Atrial flutter with rapid ventricular response - history of tachycardia diagnosed by PCP but has remained asymptomatic. TSH and Troponin values have been normal. - Echo showed EF of 45-50% with no wall motion abnormalities. - Switched from IV Cardizem to oral Cardizem 60mg  TID during the night of 05/01/2015. Consider switching Cardizem to oral BB due to low EF on recent Echocardiogram. - Consider outpatient ischemic evaluation to rule out underlying causes of Atrial Flutter and low EF. - This patients CHA2DS2-VASc Score and unadjusted Ischemic Stroke Rate (% per year) is equal to 3.2 % stroke rate/year from a score of 3 (CHF with reduced EF of 45-50%, Age > 75). Unsure of DM (Elevated glucose while in hospital, no A1c on file). Was switched to Xarelto yesterday and Case Management consult has been placed.  2. Polymyalgia rheumatica - per IM - has been on Prednisone 10mg  daily as an outpatient. Could account for elevated glucose noted during this admission.   3. History of prostate cancer - s/p prostatectomy 12/2014.  4. Hyperlipidemia - continue statin    Franchot Heidelberg , PA-C 8:22 AM 05/02/2015 Pager: [phone]

## 2015-05-02 NOTE — Progress Notes (Signed)
PROGRESS NOTE  Juan Shaffer JEH:[id number] DOB: [birth year] DOA: 04/30/2015 PCP: Velna Ochs, MD  Assessment/Plan:  Atrial flutter with rapid ventricular response- converted to sinus on 9/8 TSH and Troponin values have been normal. - Echo showed EF of 45-50% with no wall motion abnormalities. - metoprolol 12,5 mG BID- first dose this evening - xarelto has been prior authed for 1 year by me  Polymyalgia rheumatica - change from stress dose to PO prednisone and taper back to 10 mg -  on Prednisone 10mg  daily as an outpatient.   Hyperglycemia -due to steroids   History of prostate cancer - s/p prostatectomy 12/2014.  Hyperlipidemia - continue statin  Code Status: full Family Communication: patient Disposition Plan:    Consultants:  cards  Procedures:     HPI/Subjective: Converted on own back to sinus last PM  Objective: Filed Vitals:   05/02/15 0605  BP:   Pulse:   Temp: 97.3 F (36.3 C)  Resp:     Intake/Output Summary (Last 24 hours) at 05/02/15 1307 Last data filed at 05/02/15 1009  Gross per 24 hour  Intake    723 ml  Output   1075 ml  Net   -352 ml   Filed Weights   04/30/15 2346 05/01/15 0345 05/02/15 0607  Weight: 71.215 kg (157 lb) 71.215 kg (157 lb) 71.079 kg (156 lb 11.2 oz)    Exam:   General:  Awake, NAD  Cardiovascular: rrr  Respiratory: clear  Abdomen: +BS, soft  Musculoskeletal: no edema   Data Reviewed: Basic Metabolic Panel:  Recent Labs Lab 04/30/15 1909 05/01/15 0516  NA 138 139  K 4.0 3.7  CL 106 106  CO2 23 24  GLUCOSE 93 109*  BUN 12 9  CREATININE 0.99 0.95  CALCIUM 8.9 8.2*   Liver Function Tests:  Recent Labs Lab 05/01/15 0516  AST 19  ALT 31  ALKPHOS 45  BILITOT 1.0  PROT 5.6*  ALBUMIN 3.1*   No results for input(s): LIPASE, AMYLASE in the last 168 hours. No results for input(s): AMMONIA in the last 168 hours. CBC:  Recent Labs Lab 04/30/15 1909 05/01/15 0516 05/02/15 0526    WBC 10.9* 10.9* 13.8*  NEUTROABS  --  9.0*  --   HGB 15.0 14.8 13.7  HCT 45.1 44.7 41.2  MCV 87.6 88.2 88.0  PLT 244 241 207   Cardiac Enzymes:  Recent Labs Lab 05/01/15 0310 05/01/15 0516 05/01/15 1240  TROPONINI <0.03 0.03 <0.03   BNP (last 3 results) No results for input(s): BNP in the last 8760 hours.  ProBNP (last 3 results) No results for input(s): PROBNP in the last 8760 hours.  CBG:  Recent Labs Lab 05/01/15 1118 05/01/15 1623 05/02/15 0038 05/02/15 0623 05/02/15 1156  GLUCAP 187* 167* 160* 136* 114*    Recent Results (from the past 240 hour(s))  MRSA PCR Screening     Status: None   Collection Time: 05/01/15 12:09 AM  Result Value Ref Range Status   MRSA by PCR NEGATIVE NEGATIVE Final    Comment:        The GeneXpert MRSA Assay (FDA approved for NASAL specimens only), is one component of a comprehensive MRSA colonization surveillance program. It is not intended to diagnose MRSA infection nor to guide or monitor treatment for MRSA infections.      Studies: Dg Chest Portable 1 View  04/30/2015   CLINICAL DATA:  Tachycardia  EXAM: PORTABLE CHEST - 1 VIEW  COMPARISON:  None.  FINDINGS:  The heart size and mediastinal contours are within normal limits. Both lungs are clear. The visualized skeletal structures are unremarkable. Pacing pads overlie the chest.  IMPRESSION: No active disease.   Electronically Signed   By: Conchita Paris M.D.   On: 04/30/2015 20:02    Scheduled Meds: . metoprolol tartrate  12.5 mg Oral BID  . multivitamin with minerals  1 tablet Oral Daily  . predniSONE  40 mg Oral Q breakfast  . rivaroxaban  20 mg Oral Daily  . sodium chloride  3 mL Intravenous Q12H   Continuous Infusions: . sodium chloride 1,000 mL (05/01/15 1412)  . sodium chloride    . sodium chloride     Antibiotics Given (last 72 hours)    None      Principal Problem:   Atrial flutter with rapid ventricular response Active Problems:   Atrial flutter    Polymyalgia rheumatica   History of prostate cancer   Hyperlipidemia    Time spent: 25 min    VANN, JESSICA  [facility] Pager [phone]. If 7PM-7AM, please contact night-coverage at [email or web address], password [facility] 05/02/2015, 1:07 PM  LOS: 2 days

## 2015-05-02 NOTE — Progress Notes (Signed)
Patient continues to be in atrial flutter with heart rate primarily ranging from 120-140's.  Lopressor 5mg  given IV push with little effect at about 1953.  Text paged cardiology with all information in this note.

## 2015-05-02 NOTE — Progress Notes (Signed)
Paged by nurse that HR still 130s-140s. Asymptomatic. Got metoprolol 12.5mg  about 5pm. He occasionally comes down to 3:1 conduction once in a while but HR mostly 2:1 conduction. BP now 120s/70s. Will try IV metoprolol. This may be difficult to control until he gets back in NSR. There is nursing report that he went too slow with IV cardizem previously so will take cautious approach since he is asymptomatic. He was transitioned to BB today because of LV dysfunction. Jasen Hartstein PA-C

## 2015-05-02 NOTE — Progress Notes (Signed)
RN noted patient in normal sinus rhythm.  Cardiology paged, Wandra Mannan returned page and was updated.  New orders received.

## 2015-05-02 NOTE — Progress Notes (Signed)
Notified patient is back in atrial flutter.  Advised to give scheduled b-blocker dose now. Will ask EP to consult - he may be a good flutter ablation candidate.  Hold on discharge today.  Pixie Casino, MD, [facility] Attending Cardiologist [place]

## 2015-05-02 NOTE — Progress Notes (Signed)
RN spoke with Rosaria Ferries, on call for cardiology this morning pertaining to patient current rhythm Normal Sinus.  EKG completed last night to confirm rhythm.  Suanne Marker Barrett stated ok to cancel patient's TEE with cardioversion today and associated orders.

## 2015-05-02 NOTE — Progress Notes (Signed)
When RN spoke with Juan Shaffer earlier on the phone he stated to stop Cardizem infusion one hour after giving first oral dose of Cardizem.

## 2015-05-02 NOTE — Progress Notes (Signed)
Patient at 1635, flipped into flutter on the monitor, EKG done, and in chart, Called and spoke to [place], [place] with cardiology, ordered 12.5 mg lopressor now and pput in for a EP consult in the Am. Dr. Eliseo Squires was also notified and updated. Will continue to monitor patient. Current VS, BP 162/117, HR 129.Patient asymptamatic.

## 2015-05-02 NOTE — Discharge Instructions (Addendum)
Information on my medicine - Pradaxa (dabigatran)  This medication education was reviewed with me or my healthcare representative as part of my discharge preparation.  The pharmacist that spoke with me during my hospital stay was:  Linda Hedges, [facility]  Why was Pradaxa prescribed for you? Pradaxa was prescribed for you to reduce the risk of forming blood clots that cause a stroke if you have a medical condition called atrial fibrillation (a type of irregular heartbeat).    What do you Need to know about PradAXa? Take your Pradaxa TWICE DAILY - one capsule in the morning and one tablet in the evening with or without food.  It would be best to take the doses about the same time each day.  The capsules should not be broken, chewed or opened - they must be swallowed whole.  Do not store Pradaxa in other medication containers - once the bottle is opened the Pradaxa should be used within FOUR months; throw away any capsules that havent been by that time.  Take Pradaxa exactly as prescribed by your doctor.  DO NOT stop taking Pradaxa without talking to the doctor who prescribed the medication.  Stopping without other stroke prevention medication to take the place of Pradaxa may increase your risk of developing a clot that causes a stroke.  Refill your prescription before you run out.  After discharge, you should have regular check-up appointments with your healthcare provider that is prescribing your Pradaxa.  In the future your dose may need to be changed if your kidney function or weight changes by a significant amount.  What do you do if you miss a dose? If you miss a dose, take it as soon as you remember on the same day.  If your next dose is less than 6 hours away, skip the missed dose.  Do not take two doses of PRADAXA at the same time.  Important Safety Information A possible side effect of Pradaxa is bleeding. You should call your healthcare provider right away if you experience any  of the following: ? Bleeding from an injury or your nose that does not stop. ? Unusual colored urine (red or dark brown) or unusual colored stools (red or black). ? Unusual bruising for unknown reasons. ? A serious fall or if you hit your head (even if there is no bleeding).  Some medicines may interact with Pradaxa and might increase your risk of bleeding or clotting while on Pradaxa. To help avoid this, consult your healthcare provider or pharmacist prior to using any new prescription or non-prescription medications, including herbals, vitamins, non-steroidal anti-inflammatory drugs (NSAIDs) and supplements.  This website has more information on Pradaxa (dabigatran): [email or web address]://www.pradaxa.com

## 2015-05-02 NOTE — Progress Notes (Signed)
Ambrosy on call with cardiology returned page and was updated pertaining to patient's course of stay thus far this admission.  No new orders received at this time.

## 2015-05-03 ENCOUNTER — Encounter (HOSPITAL_COMMUNITY): Payer: Self-pay | Admitting: Internal Medicine

## 2015-05-03 DIAGNOSIS — I5021 Acute systolic (congestive) heart failure: Secondary | ICD-10-CM | POA: Insufficient documentation

## 2015-05-03 LAB — GLUCOSE, CAPILLARY
Glucose-Capillary: 105 mg/dL — ABNORMAL HIGH (ref 65–99)
Glucose-Capillary: 119 mg/dL — ABNORMAL HIGH (ref 65–99)
Glucose-Capillary: 100 mg/dL — ABNORMAL HIGH (ref 65–99)

## 2015-05-03 LAB — CBC
HCT: 43.3 % (ref 39.0–52.0)
Hemoglobin: 14.4 g/dL (ref 13.0–17.0)
MCH: 29.6 pg (ref 26.0–34.0)
MCHC: 33.3 g/dL (ref 30.0–36.0)
MCV: 89.1 fL (ref 78.0–100.0)
PLATELETS: 225 10*3/uL (ref 150–400)
RBC: 4.86 MIL/uL (ref 4.22–5.81)
RDW: 15.1 % (ref 11.5–15.5)
WBC: 15.9 10*3/uL — ABNORMAL HIGH (ref 4.0–10.5)

## 2015-05-03 LAB — HEMOGLOBIN A1C
HEMOGLOBIN A1C: 6.2 % — AB (ref 4.8–5.6)
MEAN PLASMA GLUCOSE: 131 mg/dL

## 2015-05-03 MED ORDER — FLECAINIDE ACETATE 100 MG PO TABS
200.0000 mg | ORAL_TABLET | Freq: Once | ORAL | Status: AC
  Administered 2015-05-03: 200 mg via ORAL
  Filled 2015-05-03: qty 2

## 2015-05-03 MED ORDER — DABIGATRAN ETEXILATE MESYLATE 150 MG PO CAPS
150.0000 mg | ORAL_CAPSULE | Freq: Two times a day (BID) | ORAL | Status: DC
  Administered 2015-05-03 – 2015-05-05 (×5): 150 mg via ORAL
  Filled 2015-05-03 (×6): qty 1

## 2015-05-03 NOTE — Consult Note (Addendum)
ELECTROPHYSIOLOGY CONSULT NOTE    Primary Care Physician: Velna Ochs, MD Referring Physician:  Dr Debara Pickett  Admit Date: 04/30/2015  Reason for consultation:  Atrial flutter  Juan Shaffer is a 74 y.o. male with a h/o recently diagnosed atrial flutter.  He reports going to Dr Kristine Royal office for routine physical and being found to have atrial flutter with RVR.  He is very active and has been without symptoms.  The etiology of his arrhythmia is unknown.  He was referred to [facility] and initiated on diltiazem IV.  He converted to sinus rhythm.  He was placed on xarelto with plans for discharge but has since had recurrence of his atrial flutter with RVR.  He has a mildly depressed EF 45%, likely tachycardia mediated.    Today, he denies symptoms of palpitations, chest pain, shortness of breath, orthopnea, PND, lower extremity edema, dizziness, presyncope, syncope, or neurologic sequela. The patient is tolerating medications without difficulties and is otherwise without complaint today.   Past Medical History  Diagnosis Date  . High cholesterol   . Polymyalgia rheumatica     followed by Dr Charlestine Night  . Prostate cancer   . Atrial flutter with rapid ventricular response 04/30/2015    chads2vasc score is 2  . 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 Prostatectomy  12/2014  . Tonsillectomy  ~ 1946    . atorvastatin  20 mg Oral q1800  . dabigatran  150 mg Oral Q12H  . flecainide  200 mg Oral Once  . metoprolol tartrate  12.5 mg Oral BID  . multivitamin with minerals  1 tablet Oral Daily  . predniSONE  40 mg Oral Q breakfast  . sodium chloride  3 mL Intravenous Q12H   . sodium chloride 1,000 mL (05/01/15 1412)  . sodium chloride    . sodium chloride      No Known Allergies  Social History   Social History  . Marital Status: Married    Spouse Name: N/A  . Number of Children: N/A  . Years of Education:  N/A   Occupational History  . Not on file.   Social History Main Topics  . Smoking status: Former Smoker -- 1.00 packs/day for 8 years    Types: Cigarettes    Quit date: 10/22/1967  . Smokeless tobacco: Never Used  . Alcohol Use: 3.6 oz/week    3 Glasses of wine, 3 Shots of liquor per week  . Drug Use: No  . Sexual Activity: Not Currently   Other Topics Concern  . Not on file   Social History Narrative   Lives in [place]   Owns a photographer business    Family History  Problem Relation Age of Onset  . Stroke      ROS- All systems are reviewed and negative except as per the HPI above  Physical Exam: Telemetry: Filed Vitals:   05/02/15 1659 05/02/15 1905 05/03/15 0435 05/03/15 0500  BP: 162/117 127/90 97/65   Pulse: 129 143 148   Temp:  97.6 F (36.4 C) 98 F (36.7 C)   TempSrc:  Oral Axillary   Resp:  15 13   Height:      Weight:    70.444 kg (155 lb 4.8 oz)  SpO2:  95% 95%     GEN- The patient is well appearing, alert and oriented x 3 today.   Head- normocephalic, atraumatic Eyes-  Sclera  clear, conjunctiva pink Ears- hearing intact Oropharynx- clear Neck- supple, no JVP Lymph- no cervical lymphadenopathy Lungs- Clear to ausculation bilaterally, normal work of breathing Heart- tachycardic irregular rhythm, no murmurs, rubs or gallops, PMI not laterally displaced GI- soft, NT, ND, + BS Extremities- no clubbing, cyanosis, or edema MS- no significant deformity or atrophy Skin- no rash or lesion Psych- euthymic mood, full affect Neuro- strength and sensation are intact  EKG reveals typical appearing atrial flutter with 2:1 AV conduction  Labs:   Lab Results  Component Value Date   WBC 15.9* 05/03/2015   HGB 14.4 05/03/2015   HCT 43.3 05/03/2015   MCV 89.1 05/03/2015   PLT 225 05/03/2015    Recent Labs Lab 05/01/15 0516  NA 139  K 3.7  CL 106  CO2 24  BUN 9  CREATININE 0.95  CALCIUM 8.2*  PROT 5.6*  BILITOT 1.0  ALKPHOS 45  ALT 31    AST 19  GLUCOSE 109*    Echo:  Reviewed,  EF 45%  ASSESSMENT AND PLAN:   1. Typical atrial flutter The patient has recurrent atrial flutter with difficult to control ventricular rates.  His chads2vasc score is 2.  Therapeutic strategies for atrial flutter including medicine and ablation were discussed in detail with the patient today. Risk, benefits, and alternatives to EP study and radiofrequency ablation were also discussed in detail today. These risks include but are not limited to stroke, bleeding, vascular damage, tamponade, perforation, damage to the heart and other structures, AV block requiring pacemaker, worsening renal function, and death. The patient understands these risk and wishes to proceed.  We will therefore proceed with catheter ablation at the next available time.  I have tentatively placed on the schedule for Monday with Dr Lovena Le.    This patients CHA2DS2-VASc Score and unadjusted Ischemic Stroke Rate (% per year) is equal to 2.2 % stroke rate/year from a score of 2  Today, I discussed novel anticoagulants as indicated for risk reduction in stroke and systemic emboli with nonvalvular atrial fibrillation.  Risks, benefits, and alternatives to each of these drugs were discussed at length today.  The patients brother is a rep for [employer].  He is clear that he would like to be on pradaxa.  Given CrCl >30, will stop xarelto and start pradaxa 150mg  BID this evening when next dose of xarelto is due.  Will also give flecainide 200mg  po x 1 now for cardioversion.  Could consider restarting diltiazem gtt if he remains in atrial flutter after this.  2. Reduced EF EF by echo is 45%.  This is likely tachycardia mediated.  If his EF does not recover with sinus rhythm, elective outpatient ischemic evaluation should be considered.  I do not see that he has very many noncardiac issues which are active.  I would be happy to take him onto my service if primary team prefers.  I  will follow over the weekend regardless. Please call with questions  Thompson Grayer, MD 05/03/2015  8:27 AM

## 2015-05-03 NOTE — Care Management Note (Signed)
Case Management Note  Patient Details  Name: Juan Shaffer MRN: [id number] Date of Birth: 1940-09-12  Subjective/Objective:                  Tachycardia  Action/Plan:   Expected Discharge Date:                  Expected Discharge Plan:  Home/Self Care  In-House Referral:     Discharge planning Services  CM Consult, Medication Assistance  Post Acute Care Choice:    Choice offered to:  NA  DME Arranged:  N/A DME Agency:     HH Arranged:  NA HH Agency:  NA  Status of Service:  Completed, signed off  Medicare Important Message Given:    Date Medicare IM Given:    Medicare IM give by:    Date Additional Medicare IM Given:    Additional Medicare Important Message give by:     If discussed at [place] of Stay Meetings, dates discussed:    Additional Comments: CM spoke with patient at the bedside. Patient given a Pradaxa $5 co-pay/30-free trial card.  Apolonio Schneiders, RN 05/03/2015, 10:25 AM

## 2015-05-04 LAB — BASIC METABOLIC PANEL
ANION GAP: 6 (ref 5–15)
BUN: 19 mg/dL (ref 6–20)
CHLORIDE: 105 mmol/L (ref 101–111)
CO2: 27 mmol/L (ref 22–32)
CREATININE: 0.98 mg/dL (ref 0.61–1.24)
Calcium: 8.8 mg/dL — ABNORMAL LOW (ref 8.9–10.3)
GFR calc non Af Amer: >60 mL/min (ref >60)
Glucose, Bld: 101 mg/dL — ABNORMAL HIGH (ref 65–99)
POTASSIUM: 4 mmol/L (ref 3.5–5.1)
Sodium: 138 mmol/L (ref 135–145)

## 2015-05-04 LAB — GLUCOSE, CAPILLARY
GLUCOSE-CAPILLARY: 87 mg/dL (ref 65–99)
GLUCOSE-CAPILLARY: 97 mg/dL (ref 65–99)
GLUCOSE-CAPILLARY: 129 mg/dL — AB (ref 65–99)
GLUCOSE-CAPILLARY: 163 mg/dL — AB (ref 65–99)

## 2015-05-04 LAB — CBC
HEMATOCRIT: 46.4 % (ref 39.0–52.0)
HEMOGLOBIN: 15.4 g/dL (ref 13.0–17.0)
MCH: 29.5 pg (ref 26.0–34.0)
MCHC: 33.2 g/dL (ref 30.0–36.0)
MCV: 88.9 fL (ref 78.0–100.0)
Platelets: 238 10*3/uL (ref 150–400)
RBC: 5.22 MIL/uL (ref 4.22–5.81)
RDW: 15 % (ref 11.5–15.5)
WBC: 15.8 10*3/uL — ABNORMAL HIGH (ref 4.0–10.5)

## 2015-05-04 MED ORDER — METOPROLOL TARTRATE 25 MG PO TABS
25.0000 mg | ORAL_TABLET | Freq: Two times a day (BID) | ORAL | Status: DC
  Administered 2015-05-04 – 2015-05-05 (×3): 25 mg via ORAL
  Filled 2015-05-04 (×3): qty 1

## 2015-05-04 MED ORDER — SODIUM CHLORIDE 0.9 % IV SOLN
INTRAVENOUS | Status: DC
  Administered 2015-05-04: 21:00:00 via INTRAVENOUS

## 2015-05-04 NOTE — Progress Notes (Signed)
SUBJECTIVE: The patient is doing well today.  At this time, he denies chest pain, shortness of breath, or any new concerns.  DId not convert with flecainide yesterday.  Tolerating pradaxa without difficulty.  Marland Kitchen atorvastatin  20 mg Oral q1800  . dabigatran  150 mg Oral Q12H  . metoprolol tartrate  25 mg Oral BID  . multivitamin with minerals  1 tablet Oral Daily  . predniSONE  40 mg Oral Q breakfast  . sodium chloride  3 mL Intravenous Q12H   . sodium chloride 1,000 mL (05/01/15 1412)  . sodium chloride    . sodium chloride      OBJECTIVE: Physical Exam: Filed Vitals:   05/03/15 0950 05/03/15 1156 05/03/15 2100 05/04/15 0500  BP: 117/75 115/80 131/97 122/84  Pulse: 163 126 128 136  Temp:  98.3 F (36.8 C) 97.6 F (36.4 C) 97.5 F (36.4 C)  TempSrc:  Oral    Resp:  16 18 22   Height:      Weight:    69.763 kg (153 lb 12.8 oz)  SpO2:  97% 96% 96%    Intake/Output Summary (Last 24 hours) at 05/04/15 0753 Last data filed at 05/04/15 0500  Gross per 24 hour  Intake    963 ml  Output   1400 ml  Net   -437 ml    Telemetry reveals atrial flutter with 2:1 conduction  GEN- The patient is well appearing, alert and oriented x 3 today.   Head- normocephalic, atraumatic Eyes-  Sclera clear, conjunctiva pink Ears- hearing intact Oropharynx- clear Neck- supple,  Lungs- Clear to ausculation bilaterally, normal work of breathing Heart-tachycardic irregular rhythm, no murmurs, rubs or gallops, PMI not laterally displaced GI- soft, NT, ND, + BS Extremities- no clubbing, cyanosis, or edema Skin- no rash or lesion Psych- euthymic mood, full affect Neuro- strength and sensation are intact  LABS: Basic Metabolic Panel:  Recent Labs  05/04/15 0314  NA 138  K 4.0  CL 105  CO2 27  GLUCOSE 101*  BUN 19  CREATININE 0.98  CALCIUM 8.8*   Liver Function Tests: No results for input(s): AST, ALT, ALKPHOS, BILITOT, PROT, ALBUMIN in the last 72 hours. No results for input(s):  LIPASE, AMYLASE in the last 72 hours. CBC:  Recent Labs  05/03/15 0349 05/04/15 0314  WBC 15.9* 15.8*  HGB 14.4 15.4  HCT 43.3 46.4  MCV 89.1 88.9  PLT 225 238   Cardiac Enzymes:  Recent Labs  05/01/15 1240  TROPONINI <0.03   BNP: Invalid input(s): POCBNP D-Dimer: No results for input(s): DDIMER in the last 72 hours. Hemoglobin A1C:  Recent Labs  05/02/15 0526  HGBA1C 6.2*    ASSESSMENT AND PLAN:  Principal Problem:   Atrial flutter with rapid ventricular response Active Problems:   Atrial flutter   Polymyalgia rheumatica   History of prostate cancer   Hyperlipidemia   Acute systolic congestive heart failure   1. Typical atrial flutter The patient has recurrent atrial flutter with difficult to control ventricular rates. His chads2vasc score is 2.  He presented with atrial flutter but converted to sinus with cardizem.  He was placed on anticoagulation prior to this.  He has returned to atrial flutter but has been fully anticoagulated since its onset.  I therefore do not feel that he would require TEE prior to ablation.  Therapeutic strategies for atrial flutter including medicine and ablation were again discussed in detail with the patient today. Risk, benefits, and alternatives to EP study  and radiofrequency ablation were also discussed in detail today. These risks include but are not limited to stroke, bleeding, vascular damage, tamponade, perforation, damage to the heart and other structures, AV block requiring pacemaker, worsening renal function, and death. The patient understands these risk and wishes to proceed. We will therefore proceed with catheter ablation for Monday with Dr Lovena Le.  Procedure orders have been placed  This patients CHA2DS2-VASc Score and unadjusted Ischemic Stroke Rate (% per year) is equal to 2.2 % stroke rate/year from a score of 2 He is on full dose pradaxa (his brother is a rep for [employer] and he prefers this  anticoagulant.)  Increase metoprolol to 25mg  BId for better rate control  2. Reduced EF EF by echo is 45%. This is likely tachycardia mediated. If his EF does not recover with sinus rhythm, elective outpatient ischemic evaluation should be considered.  3. PMR Continue on prednisone  Thompson Grayer, MD 05/04/2015 7:53 AM

## 2015-05-05 ENCOUNTER — Encounter (HOSPITAL_COMMUNITY)
Admission: EM | Disposition: A | Discharge status: Home / Self Care | Payer: Medicare Other | Source: Home / Self Care | Attending: Internal Medicine

## 2015-05-05 ENCOUNTER — Encounter (HOSPITAL_COMMUNITY): Payer: Self-pay | Admitting: Internal Medicine

## 2015-05-05 DIAGNOSIS — I483 Typical atrial flutter: Principal | ICD-10-CM

## 2015-05-05 HISTORY — PX: ELECTROPHYSIOLOGIC STUDY: SHX172A

## 2015-05-05 LAB — GLUCOSE, CAPILLARY
Glucose-Capillary: 107 mg/dL — ABNORMAL HIGH (ref 65–99)
Glucose-Capillary: 188 mg/dL — ABNORMAL HIGH (ref 65–99)

## 2015-05-05 LAB — CBC
HCT: 46.2 % (ref 39.0–52.0)
Hemoglobin: 15.3 g/dL (ref 13.0–17.0)
MCH: 29.4 pg (ref 26.0–34.0)
MCHC: 33.1 g/dL (ref 30.0–36.0)
MCV: 88.7 fL (ref 78.0–100.0)
PLATELETS: 233 10*3/uL (ref 150–400)
RBC: 5.21 MIL/uL (ref 4.22–5.81)
RDW: 15 % (ref 11.5–15.5)
WBC: 12.3 10*3/uL — ABNORMAL HIGH (ref 4.0–10.5)

## 2015-05-05 SURGERY — A-FLUTTER/A-TACH/SVT ABLATION
Anesthesia: LOCAL

## 2015-05-05 MED ORDER — FENTANYL CITRATE (PF) 100 MCG/2ML IJ SOLN
INTRAMUSCULAR | Status: AC
  Filled 2015-05-05: qty 4

## 2015-05-05 MED ORDER — SODIUM CHLORIDE 0.9 % IJ SOLN: 3.0000 mL | INTRAMUSCULAR | Status: DC | PRN

## 2015-05-05 MED ORDER — ACETAMINOPHEN 325 MG PO TABS: 650.0000 mg | ORAL_TABLET | ORAL | Status: DC | PRN

## 2015-05-05 MED ORDER — HEPARIN (PORCINE) IN NACL 2-0.9 UNIT/ML-% IJ SOLN
INTRAMUSCULAR | Status: AC
  Filled 2015-05-05: qty 500

## 2015-05-05 MED ORDER — DABIGATRAN ETEXILATE MESYLATE 150 MG PO CAPS: 150.0000 mg | ORAL_CAPSULE | Freq: Two times a day (BID) | ORAL | Status: DC

## 2015-05-05 MED ORDER — MIDAZOLAM HCL 5 MG/5ML IJ SOLN
INTRAMUSCULAR | Status: DC | PRN
  Administered 2015-05-05 (×8): 1 mg via INTRAVENOUS

## 2015-05-05 MED ORDER — FENTANYL CITRATE (PF) 100 MCG/2ML IJ SOLN
INTRAMUSCULAR | Status: DC | PRN
  Administered 2015-05-05 (×6): 12.5 ug via INTRAVENOUS
  Administered 2015-05-05: 25 ug via INTRAVENOUS

## 2015-05-05 MED ORDER — BUPIVACAINE HCL (PF) 0.25 % IJ SOLN
INTRAMUSCULAR | Status: AC
  Filled 2015-05-05: qty 60

## 2015-05-05 MED ORDER — ONDANSETRON HCL 4 MG/2ML IJ SOLN: 4.0000 mg | Freq: Four times a day (QID) | INTRAMUSCULAR | Status: DC | PRN

## 2015-05-05 MED ORDER — PREDNISONE 10 MG PO TABS: 30.0000 mg | ORAL_TABLET | Freq: Every day | ORAL | Status: DC

## 2015-05-05 MED ORDER — MIDAZOLAM HCL 5 MG/5ML IJ SOLN
INTRAMUSCULAR | Status: AC
  Filled 2015-05-05: qty 25

## 2015-05-05 MED ORDER — SODIUM CHLORIDE 0.9 % IJ SOLN
3.0000 mL | Freq: Two times a day (BID) | INTRAMUSCULAR | Status: DC
  Administered 2015-05-05: 3 mL via INTRAVENOUS

## 2015-05-05 MED ORDER — BUPIVACAINE HCL (PF) 0.25 % IJ SOLN
INTRAMUSCULAR | Status: DC | PRN
  Administered 2015-05-05: 20 mL

## 2015-05-05 MED ORDER — METOPROLOL TARTRATE 25 MG PO TABS: 25.0000 mg | ORAL_TABLET | Freq: Two times a day (BID) | ORAL | Status: DC

## 2015-05-05 MED ORDER — SODIUM CHLORIDE 0.9 % IV SOLN: 250.0000 mL | INTRAVENOUS | Status: DC | PRN

## 2015-05-05 SURGICAL SUPPLY — 12 items
BAG SNAP BAND KOVER 36X36 (MISCELLANEOUS) ×1 IMPLANT
CATH BLAZERPRIME XP (ABLATOR) ×1 IMPLANT
CATH DUODECA/ISMUS 7FR REPROC (CATHETERS) ×1 IMPLANT
CATH HEX JOS 2-5-2 65CM 6F REP (CATHETERS) ×1 IMPLANT
CATH QUAD JOSEPHSON 5FR REPROC (CATHETERS) ×1 IMPLANT
PACK EP LATEX FREE (CUSTOM PROCEDURE TRAY) ×2
PACK EP LF (CUSTOM PROCEDURE TRAY) IMPLANT
PAD DEFIB LIFELINK (PAD) ×1 IMPLANT
SHEATH PINNACLE 6F 10CM (SHEATH) ×2 IMPLANT
SHEATH PINNACLE 7F 10CM (SHEATH) ×1 IMPLANT
SHEATH PINNACLE 8F 10CM (SHEATH) ×2 IMPLANT
SHIELD RADPAD SCOOP 12X17 (MISCELLANEOUS) ×1 IMPLANT

## 2015-05-05 NOTE — Progress Notes (Signed)
Patient ID: PRATEEK KNIPPLE, male   DOB: [birth year], 74 y.o.   MRN: [id number] Reason for consultation: Atrial flutter  KEATON BEICHNER is a 74 y.o. male with a h/o recently diagnosed atrial flutter. He reports going to Dr Kristine Royal office for routine physical and being found to have atrial flutter with RVR. He is very active and has been without symptoms. The etiology of his arrhythmia is unknown. He was referred to [facility] and initiated on diltiazem IV. He converted to sinus rhythm. He was placed on xarelto with plans for discharge but has since had recurrence of his atrial flutter with RVR. He has a mildly depressed EF 45%, likely tachycardia mediated.   Today, he denies symptoms of palpitations, chest pain, shortness of breath, orthopnea, PND, lower extremity edema, dizziness, presyncope, syncope, or neurologic sequela. The patient is tolerating medications without difficulties and is otherwise without complaint today.   Past Medical History  Diagnosis Date  . High cholesterol   . Polymyalgia rheumatica     followed by Dr Charlestine Night  . Prostate cancer   . Atrial flutter with rapid ventricular response 04/30/2015    chads2vasc score is 2  . 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 Prostatectomy  12/2014  . Tonsillectomy  ~ 1946    . atorvastatin 20 mg Oral q1800  . dabigatran 150 mg Oral Q12H  . flecainide 200 mg Oral Once  . metoprolol tartrate 12.5 mg Oral BID  . multivitamin with minerals 1 tablet Oral Daily  . predniSONE 40 mg Oral Q breakfast  . sodium chloride 3 mL Intravenous Q12H   . sodium chloride 1,000 mL (05/01/15 1412)  . sodium chloride   . sodium chloride     No Known Allergies  Social History   Social History  . Marital Status: Married    Spouse Name: N/A  . Number  of Children: N/A  . Years of Education: N/A   Occupational History  . Not on file.   Social History Main Topics  . Smoking status: Former Smoker -- 1.00 packs/day for 8 years    Types: Cigarettes    Quit date: 10/22/1967  . Smokeless tobacco: Never Used  . Alcohol Use: 3.6 oz/week    3 Glasses of wine, 3 Shots of liquor per week  . Drug Use: No  . Sexual Activity: Not Currently   Other Topics Concern  . Not on file   Social History Narrative   Lives in [place]   Owns a photographer business    Family History  Problem Relation Age of Onset  . Stroke      ROS- All systems are reviewed and negative except as per the HPI above  Physical Exam: Telemetry: Filed Vitals:   05/02/15 1659 05/02/15 1905 05/03/15 0435 05/03/15 0500  BP: 162/117 127/90 97/65   Pulse: 129 143 148   Temp:  97.6 F (36.4 C) 98 F (36.7 C)   TempSrc:  Oral Axillary   Resp:  15 13   Height:      Weight:    70.444 kg (155 lb 4.8 oz)  SpO2:  95% 95%     GEN- The patient is well appearing, alert and oriented x 3 today.  Head- normocephalic, atraumatic Eyes- Sclera clear, conjunctiva pink Ears- hearing intact Oropharynx- clear Neck- supple, no JVP Lymph- no cervical lymphadenopathy Lungs- Clear to ausculation bilaterally, normal work of breathing Heart-  tachycardic irregular rhythm, no murmurs, rubs or gallops, PMI not laterally displaced GI- soft, NT, ND, + BS Extremities- no clubbing, cyanosis, or edema MS- no significant deformity or atrophy Skin- no rash or lesion Psych- euthymic mood, full affect Neuro- strength and sensation are intact  EKG reveals typical appearing atrial flutter with 2:1 AV conduction  Labs:  Lab Results  Component Value Date   WBC 15.9* 05/03/2015   HGB 14.4 05/03/2015   HCT 43.3 05/03/2015   MCV 89.1 05/03/2015   PLT  225 05/03/2015    Recent Labs Lab 05/01/15 0516  NA 139  K 3.7  CL 106  CO2 24  BUN 9  CREATININE 0.95  CALCIUM 8.2*  PROT 5.6*  BILITOT 1.0  ALKPHOS 45  ALT 31  AST 19  GLUCOSE 109*    Echo: Reviewed, EF 45%  ASSESSMENT AND PLAN:   1. Typical atrial flutter The patient has recurrent atrial flutter with difficult to control ventricular rates. His chads2vasc score is 2.  Therapeutic strategies for atrial flutter including medicine and ablation were discussed in detail with the patient today. Risk, benefits, and alternatives to EP study and radiofrequency ablation were also discussed in detail today. These risks include but are not limited to stroke, bleeding, vascular damage, tamponade, perforation, damage to the heart and other structures, AV block requiring pacemaker, worsening renal function, and death. The patient understands these risk and wishes to proceed. We will therefore proceed with catheter ablation at the next available time. I have tentatively placed on the schedule for Monday with Dr Lovena Le.   This patients CHA2DS2-VASc Score and unadjusted Ischemic Stroke Rate (% per year) is equal to 2.2 % stroke rate/year from a score of 2  Today, I discussed novel anticoagulants as indicated for risk reduction in stroke and systemic emboli with nonvalvular atrial fibrillation. Risks, benefits, and alternatives to each of these drugs were discussed at length today. The patients brother is a rep for [employer]. He is clear that he would like to be on pradaxa. Given CrCl >30, will stop xarelto and start pradaxa 150mg  BID this evening when next dose of xarelto is due.  Will also give flecainide 200mg  po x 1 now for cardioversion. Could consider restarting diltiazem gtt if he remains in atrial flutter after this.  2. Reduced EF EF by echo is 45%. This is likely tachycardia mediated. If his EF does not recover with sinus rhythm,  elective outpatient ischemic evaluation should be considered.  I do not see that he has very many noncardiac issues which are active. I would be happy to take him onto my service if primary team prefers.  I will follow over the weekend regardless     EP Attending  Patient seen and examined. Persistent atrial flutter with an RVR continues. I have discussed the indications, risks/benefits/goals/expectations of catheter ablation of atrial flutter and he wishes to proceed.   Mikle Bosworth.D.

## 2015-05-05 NOTE — Interval H&P Note (Signed)
History and Physical Interval Note:  05/05/2015 10:12 AM  Juan Shaffer  has presented today for surgery, with the diagnosis of a flutter  The various methods of treatment have been discussed with the patient and family. After consideration of risks, benefits and other options for treatment, the patient has consented to  Procedure(s): A-Flutter Ablation (N/A) as a surgical intervention .  The patient's history has been reviewed, patient examined, no change in status, stable for surgery.  I have reviewed the patient's chart and labs.  Questions were answered to the patient's satisfaction.     Cristopher Peru

## 2015-05-05 NOTE — Progress Notes (Signed)
Interval coverage note:  Per hand off instruction, patient seen at [place], at which time he denies any CP or SOB.   Telemetry shows NSR with HR 80s since ablation without recurrent aflutter. Cath site in R groin and R neck stable without active bleeding. SBP stable 110-120s. He is stable for discharge.  Advised no strenuous activity for 1 week. Keep cath site clean and dry.  Hilbert Corrigan PA Pager: [phone]

## 2015-05-05 NOTE — Progress Notes (Signed)
Site area: rt groin 3 fv sheaths Site Prior to Removal:  Level 1 hematoma above sheaths Pressure Applied For:  20 minutes Manual:   yes Patient Status During Pull:  stable Post Pull Site:  Level  0, hematoma resolved Post Pull Instructions Given:  yes Post Pull Pulses Present: yes Dressing Applied:  Small tegaderm Bedrest begins @  1200 Comments:  IV saline locked

## 2015-05-05 NOTE — H&P (View-Only) (Signed)
Patient ID: Juan Shaffer, male   DOB: 1941/02/13, 74 y.o.   MRN: [id number] Reason for consultation: Atrial flutter  Juan Shaffer is a 74 y.o. male with a h/o recently diagnosed atrial flutter. He reports going to Dr Kristine Royal office for routine physical and being found to have atrial flutter with RVR. He is very active and has been without symptoms. The etiology of his arrhythmia is unknown. He was referred to [facility] and initiated on diltiazem IV. He converted to sinus rhythm. He was placed on xarelto with plans for discharge but has since had recurrence of his atrial flutter with RVR. He has a mildly depressed EF 45%, likely tachycardia mediated.   Today, he denies symptoms of palpitations, chest pain, shortness of breath, orthopnea, PND, lower extremity edema, dizziness, presyncope, syncope, or neurologic sequela. The patient is tolerating medications without difficulties and is otherwise without complaint today.   Past Medical History  Diagnosis Date  . High cholesterol   . Polymyalgia rheumatica     followed by Dr Charlestine Night  . Prostate cancer   . Atrial flutter with rapid ventricular response 04/30/2015    chads2vasc score is 2  . 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 Prostatectomy  12/2014  . Tonsillectomy  ~ 1946    . atorvastatin 20 mg Oral q1800  . dabigatran 150 mg Oral Q12H  . flecainide 200 mg Oral Once  . metoprolol tartrate 12.5 mg Oral BID  . multivitamin with minerals 1 tablet Oral Daily  . predniSONE 40 mg Oral Q breakfast  . sodium chloride 3 mL Intravenous Q12H   . sodium chloride 1,000 mL (05/01/15 1412)  . sodium chloride   . sodium chloride     No Known Allergies  Social History   Social History  . Marital Status: Married    Spouse Name: N/A  . Number  of Children: N/A  . Years of Education: N/A   Occupational History  . Not on file.   Social History Main Topics  . Smoking status: Former Smoker -- 1.00 packs/day for 8 years    Types: Cigarettes    Quit date: 10/22/1967  . Smokeless tobacco: Never Used  . Alcohol Use: 3.6 oz/week    3 Glasses of wine, 3 Shots of liquor per week  . Drug Use: No  . Sexual Activity: Not Currently   Other Topics Concern  . Not on file   Social History Narrative   Lives in [place]   Owns a photographer business    Family History  Problem Relation Age of Onset  . Stroke      ROS- All systems are reviewed and negative except as per the HPI above  Physical Exam: Telemetry: Filed Vitals:   05/02/15 1659 05/02/15 1905 05/03/15 0435 05/03/15 0500  BP: 162/117 127/90 97/65   Pulse: 129 143 148   Temp:  97.6 F (36.4 C) 98 F (36.7 C)   TempSrc:  Oral Axillary   Resp:  15 13   Height:      Weight:    70.444 kg (155 lb 4.8 oz)  SpO2:  95% 95%     GEN- The patient is well appearing, alert and oriented x 3 today.  Head- normocephalic, atraumatic Eyes- Sclera clear, conjunctiva pink Ears- hearing intact Oropharynx- clear Neck- supple, no JVP Lymph- no cervical lymphadenopathy Lungs- Clear to ausculation bilaterally, normal work of breathing Heart-  tachycardic irregular rhythm, no murmurs, rubs or gallops, PMI not laterally displaced GI- soft, NT, ND, + BS Extremities- no clubbing, cyanosis, or edema MS- no significant deformity or atrophy Skin- no rash or lesion Psych- euthymic mood, full affect Neuro- strength and sensation are intact  EKG reveals typical appearing atrial flutter with 2:1 AV conduction  Labs:  Lab Results  Component Value Date   WBC 15.9* 05/03/2015   HGB 14.4 05/03/2015   HCT 43.3 05/03/2015   MCV 89.1 05/03/2015   PLT  225 05/03/2015    Recent Labs Lab 05/01/15 0516  NA 139  K 3.7  CL 106  CO2 24  BUN 9  CREATININE 0.95  CALCIUM 8.2*  PROT 5.6*  BILITOT 1.0  ALKPHOS 45  ALT 31  AST 19  GLUCOSE 109*    Echo: Reviewed, EF 45%  ASSESSMENT AND PLAN:   1. Typical atrial flutter The patient has recurrent atrial flutter with difficult to control ventricular rates. His chads2vasc score is 2.  Therapeutic strategies for atrial flutter including medicine and ablation were discussed in detail with the patient today. Risk, benefits, and alternatives to EP study and radiofrequency ablation were also discussed in detail today. These risks include but are not limited to stroke, bleeding, vascular damage, tamponade, perforation, damage to the heart and other structures, AV block requiring pacemaker, worsening renal function, and death. The patient understands these risk and wishes to proceed. We will therefore proceed with catheter ablation at the next available time. I have tentatively placed on the schedule for Monday with Dr Lovena Le.   This patients CHA2DS2-VASc Score and unadjusted Ischemic Stroke Rate (% per year) is equal to 2.2 % stroke rate/year from a score of 2  Today, I discussed novel anticoagulants as indicated for risk reduction in stroke and systemic emboli with nonvalvular atrial fibrillation. Risks, benefits, and alternatives to each of these drugs were discussed at length today. The patients brother is a rep for [employer]. He is clear that he would like to be on pradaxa. Given CrCl >30, will stop xarelto and start pradaxa 150mg  BID this evening when next dose of xarelto is due.  Will also give flecainide 200mg  po x 1 now for cardioversion. Could consider restarting diltiazem gtt if he remains in atrial flutter after this.  2. Reduced EF EF by echo is 45%. This is likely tachycardia mediated. If his EF does not recover with sinus rhythm,  elective outpatient ischemic evaluation should be considered.  I do not see that he has very many noncardiac issues which are active. I would be happy to take him onto my service if primary team prefers.  I will follow over the weekend regardless     EP Attending  Patient seen and examined. Persistent atrial flutter with an RVR continues. I have discussed the indications, risks/benefits/goals/expectations of catheter ablation of atrial flutter and he wishes to proceed.   Mikle Bosworth.D.

## 2015-05-05 NOTE — Discharge Summary (Signed)
CARDIOLOGY DISCHARGE SUMMARY   Patient ID: Juan Shaffer MRN: [id number] DOB/AGE: August 23, 1941 74 y.o.  Admit date: 04/30/2015 Discharge date: 05/06/2015  PCP: Velna Ochs, MD Primary Cardiologist: Dr Rayann Heman  Primary Discharge Diagnosis:    Atrial flutter with rapid ventricular response Secondary Discharge Diagnosis:    Atrial flutter   Polymyalgia rheumatica   History of prostate cancer   Hyperlipidemia   Acute systolic congestive heart failure   Chronic anticoagulation-Pradaxa  Consults: EP  Procedures: Successful radiofrequency ablation of atrial flutter along the cavotricuspid isthmus with complete bidirectional isthmus block achieved, 2-D echocardiogram   Hospital Course: Juan Shaffer is a 74 y.o. male with a history of hyperlipidemia, prostate cancer and polymyalgia rheumatica on chronic sterile therapy. He went to Dr. Harlene Salts for a routine physical and was found to be in atrial flutter with rapid ventricular response. He was sent to [facility] and admitted for further evaluation and treatment.  He was initially admitted by internal medicine, but was seen by cardiology and by electrophysiology consult. As he had no issues other than the atrial flutter, he was transferred to the cardiology service.  He was put on IV Cardizem for rate control. He spontaneously converted to sinus rhythm overnight. He was bradycardic on the Cardizem once he was back in sinus rhythm, so the IV Cardizem was discontinued. He had been placed on heparin but was considered a good candidate for NOAC. He was started on Pradaxa. A case management consult was called to help determine the cost of the drug to him. He will have a $5 co-pay and was also given a card for a 30-days free trial.  An echocardiogram was ordered and showed a slightly decreased EF at 45-50 percent. There were no wall motion abnormalities. 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 If his EF does  not recover once he is maintaining sinus rhythm, outpatient ischemic evaluation will be considered.  He went back into atrial flutter with rapid ventricular response and continued to go in and out during his hospital stay. Flecainide challenge was tried with 200 mg but it was unsuccessful. He was started on low dose of metoprolol, and this was increased as his blood pressure and heart rate would allow.  He was seen by Dr. Rayann Heman with Electrophysiology. Because he had been fully anticoagulated since admission and had no problems when he was in sinus rhythm, Dr. Rayann Heman did not feel a TEE was needed prior to ablation.  On 05/05/2015, [name] did an EP study with ablation that was successful, returning the patient to sinus rhythm.  Post--procedure, he is ambulating without chest pain or shortness of breath. He is having no problems with the ablation sites. No further inpatient workup is indicated and he is considered stable for discharge, to follow up as an outpatient.  Labs:   Lab Results  Component Value Date   WBC 12.3* 05/05/2015   HGB 15.3 05/05/2015   HCT 46.2 05/05/2015   MCV 88.7 05/05/2015   PLT 233 05/05/2015     Recent Labs Lab 05/01/15 0516 05/04/15 0314  NA 139 138  K 3.7 4.0  CL 106 105  CO2 24 27  BUN 9 19  CREATININE 0.95 0.98  CALCIUM 8.2* 8.8*  PROT 5.6*  --   BILITOT 1.0  --   ALKPHOS 45  --   ALT 31  --   AST 19  --   GLUCOSE 109* 101*     Radiology:  Dg Chest Portable 1 View 04/30/2015   CLINICAL DATA:  Tachycardia  EXAM: PORTABLE CHEST - 1 VIEW  COMPARISON:  None.  FINDINGS: The heart size and mediastinal contours are within normal limits. Both lungs are clear. The visualized skeletal structures are unremarkable. Pacing pads overlie the chest.  IMPRESSION: No active disease.   Electronically Signed   By: Conchita Paris M.D.   On: 04/30/2015 20:02   EP study and RFCA: 05/05/2015 Measurements Post ablation: Following ablation, the stimulus to earliest  atrial activation recorded across the isthmus line measured 150 milliseconds. The AH interval measured 101 milliseconds with an HV interval of 39 milliseconds. Atrial pacing was performed, which revealed decremental AV conduction with no evidence of PR greater than RR. The AV Wenckebach cycle length was 320 milliseconds. Atrial pacing was continued down to a cycle length of 200 milliseconds with no arrhythmias induced. Ventricular pacing was performed, which revealed midline decremental VA conduction with a retrograde VA block of 600 milliseconds. No arrhythmias were induced. The procedure was therefore considered completed. All catheters were removed and the sheaths were aspirated and flushed. The sheaths were removed and hemostasis was assured. There were no early apparent complications.  CONCLUSIONS:  1. Isthmus-dependent right atrial flutter upon presentation.  2. Successful radiofrequency ablation of atrial flutter along the cavotricuspid isthmus with complete bidirectional isthmus block achieved.  3. No inducible arrhythmias following ablation.  4. No early apparent complications.   EKG: 05/02/2015 Atrial flutter with 21 conduction Vent. rate 142 BPM PR interval * ms QRS duration 76 ms QT/QTc 262/403 ms P-R-T axes 242 -18 81  Echo: 92,016 Conclusions - Left ventricle: The cavity size was normal. Wall thickness was increased in a pattern of moderate LVH. Systolic function was mildly reduced. The estimated ejection fraction was in the range of 45% to 50%. Wall motion was normal; there were no regional wall motion abnormalities. - Pericardium, extracardiac: A trivial pericardial effusion was identified.  FOLLOW UP PLANS AND APPOINTMENTS No Known Allergies   Medication List    STOP taking these medications        ibuprofen 600 MG tablet  Commonly known as:  ADVIL,MOTRIN      TAKE these medications        alendronate 70 MG tablet  Commonly known as:   FOSAMAX  Take 70 mg by mouth once a week. Take with a full glass of water on an empty stomach. Sundays (Days vary per patient). He did states he taken it yesterday (on a Tuesday) per patient     aspirin 81 MG tablet  Take 81 mg by mouth daily.     atorvastatin 20 MG tablet  Commonly known as:  LIPITOR  Take 20 mg by mouth every evening.     calcium carbonate 500 MG chewable tablet  Commonly known as:  TUMS - dosed in mg elemental calcium  Chew 1 tablet by mouth 2 (two) times daily.     clobetasol cream 0.05 %  Commonly known as:  TEMOVATE  Apply 1 application topically 2 (two) times daily.     dabigatran 150 MG Caps capsule  Commonly known as:  PRADAXA  Take 1 capsule (150 mg total) by mouth every 12 (twelve) hours.     glucosamine-chondroitin 500-400 MG tablet  Take 1 tablet by mouth daily.     metoprolol tartrate 25 MG tablet  Commonly known as:  LOPRESSOR  Take 1 tablet (25 mg total) by mouth 2 (two) times daily.  omega-3 acid ethyl esters 1 G capsule  Commonly known as:  LOVAZA  Take 1 g by mouth daily.     oxyCODONE-acetaminophen 5-325 MG per tablet  Commonly known as:  PERCOCET/ROXICET  Take 1 tablet by mouth every 4 (four) hours as needed for moderate pain or severe pain.     predniSONE 5 MG tablet  Commonly known as:  DELTASONE  Take 5 mg by mouth daily with breakfast.     predniSONE 10 MG tablet  Commonly known as:  DELTASONE  Take 3 tablets (30 mg total) by mouth daily with breakfast. 3 tabs (30 mg) 09/13, taper by 1 tab (10 mg) daily. Then resume 5 mg daily.     tadalafil 5 MG tablet  Commonly known as:  CIALIS  Take 5 mg by mouth daily. Take every day per patient     VITAMIN D-3 PO  Take 1 tablet by mouth daily.         Follow-up Information    Follow up with Erlene Quan, PA-C On 05/13/2015.   Specialties:  Cardiology, Radiology   Why:  [facility] Follow-Up Appointment on 05/13/2015 at 8:00AM.   Contact information:   [address] [place] [address] [phone]       Follow up with Patsey Berthold, NP On 06/16/2015.   Specialty:  Nurse Practitioner   Why:  @8 :Judeen Hammans information:   [place] [address] [phone]       BRING ALL MEDICATIONS WITH YOU TO FOLLOW UP APPOINTMENTS  Time spent with patient to include physician time: 34 min Signed: Rosaria Ferries, PA-C 05/06/2015, 4:37 PM Co-Sign MD  EP Attending  Patient seen and examined. Agree with above assessment and plan. He is stable for discharge.  Mikle Bosworth.D.

## 2015-05-05 NOTE — Progress Notes (Signed)
Site area: right Internal Jugular a 7 french venous sheath was removed.  Site Prior to Removal:  Level 0  Pressure Applied For 10 MINUTES    Minutes Beginning at 1135am  Manual:   Yes.    Patient Status During Pull:  stable  Post Pull Groin Site:  Level 0  Post Pull Instructions Given:  Yes.    Post Pull Pulses Present:  Yes.    Dressing Applied:  Yes.    Comments:  VS remain stable during sheath pull.

## 2015-05-06 ENCOUNTER — Telehealth: Payer: Self-pay | Admitting: *Deleted

## 2015-05-06 ENCOUNTER — Telehealth: Payer: Self-pay

## 2015-05-06 MED FILL — Heparin Sodium (Porcine) 2 Unit/ML in Sodium Chloride 0.9%: INTRAMUSCULAR | Qty: 500 | Status: AC

## 2015-05-06 NOTE — Telephone Encounter (Signed)
Prior auth for Pradaxa 150mg  sent to [facility] via [place] [facility]

## 2015-05-06 NOTE — Telephone Encounter (Signed)
-----   Message from Patsey Berthold, NP sent at 05/05/2015  7:55 PM EDT ----- He will be a transition of care visit with Lurena Joiner (appt already scheduled).  Please do TOC phone call. Anticipate DC 9/13  Thank you!

## 2015-05-07 NOTE — Telephone Encounter (Signed)
TCM - Attempted to contact patient regarding discharge from hospital on 05/06/2015.  LMTCB.

## 2015-05-09 ENCOUNTER — Telehealth: Payer: Self-pay

## 2015-05-09 NOTE — Telephone Encounter (Signed)
Pradaxa 150mg  approved for one year. Review # [id number]. Pharmacy notified.

## 2015-05-09 NOTE — Telephone Encounter (Signed)
Patient contacted regarding discharge from [facility] on 05/09/2015.  Patient understands to follow up with provider Rosaria Ferries PA on 05/20/2015 at 3:00 pm at [facility]. Patient understands discharge instructions? Yes Patient understands medications and regiment? Yes Patient understands to bring all medications to this visit? Yes

## 2015-05-13 ENCOUNTER — Ambulatory Visit: Payer: Medicare Other | Admitting: Cardiology

## 2015-05-20 ENCOUNTER — Ambulatory Visit (INDEPENDENT_AMBULATORY_CARE_PROVIDER_SITE_OTHER): Payer: PRIVATE HEALTH INSURANCE | Admitting: Physician Assistant

## 2015-05-20 ENCOUNTER — Encounter: Payer: Self-pay | Admitting: Physician Assistant

## 2015-05-20 VITALS — BP 124/78 | HR 63 | Ht 69.0 in | Wt 155.7 lb

## 2015-05-20 DIAGNOSIS — Z9889 Other specified postprocedural states: Secondary | ICD-10-CM

## 2015-05-20 DIAGNOSIS — Z8679 Personal history of other diseases of the circulatory system: Secondary | ICD-10-CM

## 2015-05-20 NOTE — Patient Instructions (Signed)
Your physician wants you to follow-up in: 3 Month with Dr Rayann Heman. You will receive a reminder letter in the mail two months in advance. If you don't receive a letter, please call our office to schedule the follow-up appointment.

## 2015-05-20 NOTE — Progress Notes (Signed)
Cardiology Office Note   Date:  05/20/2015   ID:  Juan Shaffer, DOB Apr 04, 1941, MRN [id number]  PCP:  Velna Ochs, MD  Cardiologist:  Dr Madolyn Frieze, PA-C   Chief Complaint  Patient presents with  . Hospitalization Follow-up    follow-up ablation. patient reports no complaints    History of Present Illness: Juan Shaffer is a 74 y.o. male with a history of prostate CA, PMR, HL, atrial flutter, RVR on Eliquis.  Juan Shaffer presents for follow-up after his atrial flutter ablation.  Juan Shaffer did not have palpitations when he had the atrial flutter so he does not actually feel any different. His catheter sites have healed in well. He is compliant with his medications and has not missed any doses of the Eliquis. He generally feels well.  He was to know when he can start exercising again. He wants to know if he will get this again. He did have an episode for a few minutes where his arms felt tired but did not check his heart or blood pressure and did not have any other symptoms. This episode resolved without intervention and only lasted a few minutes.   Past Medical History  Diagnosis Date  . High cholesterol   . Polymyalgia rheumatica     followed by Dr Charlestine Night  . Prostate cancer   . Atrial flutter with rapid ventricular response 04/30/2015    chads2vasc score is 2  . 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 Prostatectomy  12/2014  . Tonsillectomy  ~ 1946  . Electrophysiologic study N/A 05/05/2015    Procedure: A-Flutter Ablation;  Surgeon: Evans Lance, MD;  Location: [facility];  Service: Cardiovascular;  Laterality: N/A;    Current Outpatient Prescriptions  Medication Sig Dispense Refill  . alendronate (FOSAMAX) 70 MG tablet Take 70 mg by mouth once a week. Take with a full glass of water on an empty stomach. Sundays (Days vary per patient). He did states  he taken it yesterday (on a Tuesday) per patient    . atorvastatin (LIPITOR) 20 MG tablet Take 20 mg by mouth every evening.     . calcium carbonate (TUMS - DOSED IN MG ELEMENTAL CALCIUM) 500 MG chewable tablet Chew 1 tablet by mouth 2 (two) times daily.    . Cholecalciferol (VITAMIN D-3 PO) Take 1 tablet by mouth daily.    . dabigatran (PRADAXA) 150 MG CAPS capsule Take 1 capsule (150 mg total) by mouth every 12 (twelve) hours. 60 capsule 11  . glucosamine-chondroitin 500-400 MG tablet Take 1 tablet by mouth daily.    . metoprolol tartrate (LOPRESSOR) 25 MG tablet Take 1 tablet (25 mg total) by mouth 2 (two) times daily. 60 tablet 11  . omega-3 acid ethyl esters (LOVAZA) 1 G capsule Take 1 g by mouth daily.    . predniSONE (DELTASONE) 5 MG tablet Take 5 mg by mouth daily with breakfast.    . tadalafil (CIALIS) 5 MG tablet Take 5 mg by mouth daily. Take every day per patient     No current facility-administered medications for this visit.    Allergies:   Review of patient's allergies indicates no known allergies.    Social History:  The patient  reports that he quit smoking about 47 years ago. His smoking use included Cigarettes. He has a 8 pack-year smoking history. He has  never used smokeless tobacco. He reports that he drinks about 3.6 oz of alcohol per week. He reports that he does not use illicit drugs.   Family History:  The patient's family history includes Stroke in an other family member.    ROS:  Please see the history of present illness. All other systems are reviewed and negative.    PHYSICAL EXAM: VS:  BP 124/78 mmHg  Pulse 63  Ht 5\' 9"  (1.753 m)  Wt 155 lb 11.2 oz (70.625 kg)  BMI 22.98 kg/m2 , BMI Body mass index is 22.98 kg/(m^2). GEN: Well nourished, well developed, male in no acute distress HEENT: normal for age Neck: no JVD, carotid bruits, or masses Cardiac: RRR; no murmurs, rubs, or gallops,no edema  Respiratory:  clear to auscultation bilaterally, normal work  of breathing GI: soft, nontender, nondistended, + BS MS: no deformity or atrophy; distal pulses 2+ in all 4 extremities Skin: warm and dry, no rash Neuro:  Strength and sensation are intact Psych: euthymic mood, full affect   EKG:  EKG is ordered today. The ekg ordered today demonstrates sinus rhythm, no acute ischemic changes   Recent Labs: 04/30/2015: TSH 2.553 05/01/2015: ALT 31 05/04/2015: BUN 19; Creatinine, Ser 0.98; Potassium 4.0; Sodium 138 05/05/2015: Hemoglobin 15.3; Platelets 233    Lipid Panel No results found for: CHOL, TRIG, HDL, CHOLHDL, VLDL, LDLCALC, LDLDIRECT   Wt Readings from Last 3 Encounters:  05/20/15 155 lb 11.2 oz (70.625 kg)  05/05/15 152 lb 8 oz (69.174 kg)     Other studies Reviewed: Additional studies/ records that were reviewed today include: Hospital records, previous ECGs and office notes.  ASSESSMENT AND PLAN:  1.  Atrial flutter, status post recent ablation: He is asymptomatic. He has a blood pressure cuff at home and he is encouraged to check his heart rate and blood pressure occasionally and any time he has symptoms. If his heart rate is elevated or if he has any presyncope or any other unusual symptoms, he is to make sure he gets an ECG.    Current medicines are reviewed at length with the patient today.  The patient does not have concerns regarding medicines.  The following changes have been made:  no change  Labs/ tests ordered today include:   ECG   Disposition:   FU with Dr. Rayann Heman in 3 months   Signed, Lenoard Aden  05/20/2015 6:10 PM    [facility] [place], [place], [place]  [address] Phone: [phone]; Fax: [phone]

## 2015-06-16 ENCOUNTER — Encounter: Payer: Medicare Other | Admitting: Nurse Practitioner

## 2015-06-24 ENCOUNTER — Encounter: Payer: Medicare Other | Admitting: Physician Assistant

## 2015-06-24 NOTE — Progress Notes (Signed)
Cardiology Office Note Date:  06/24/2015  Patient ID:  Juan Shaffer, Juan Shaffer Feb 13, 1941, MRN [id number] PCP:  Velna Ochs, MD  Cardiologist:    Chief Complaint:   History of Present Illness: Juan Shaffer is a 74 y.o. 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 High cholesterol   . Polymyalgia rheumatica     followed by Dr Charlestine Night  . Prostate cancer   . Atrial flutter with rapid ventricular response 04/30/2015    chads2vasc score is 2  . 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 Prostatectomy  12/2014  . Tonsillectomy  ~ 1946  . Electrophysiologic study N/A 05/05/2015    Procedure: A-Flutter Ablation;  Surgeon: Evans Lance, MD;  Location: [facility];  Service: Cardiovascular;  Laterality: N/A;    Current Outpatient Prescriptions  Medication Sig Dispense Refill  . alendronate (FOSAMAX) 70 MG tablet Take 70 mg by mouth once a week. Take with a full glass of water on an empty stomach. Sundays (Days vary per patient). He did states he taken it yesterday (on a Tuesday) per patient    . atorvastatin (LIPITOR) 20 MG tablet Take 20 mg by mouth every evening.     . calcium carbonate (TUMS - DOSED IN MG ELEMENTAL CALCIUM) 500 MG chewable tablet Chew 1 tablet by mouth 2 (two) times daily.    . Cholecalciferol (VITAMIN D-3 PO) Take 1 tablet by mouth daily.    . dabigatran (PRADAXA) 150 MG CAPS capsule Take 1 capsule (150 mg total) by mouth every 12 (twelve) hours. 60 capsule 11  . glucosamine-chondroitin 500-400 MG tablet Take 1 tablet by mouth daily.    . metoprolol tartrate (LOPRESSOR) 25 MG tablet Take 1 tablet (25 mg total) by mouth 2 (two) times daily. 60 tablet 11  . omega-3 acid ethyl esters (LOVAZA) 1 G capsule Take 1 g by mouth daily.    . predniSONE (DELTASONE) 5 MG tablet Take 5 mg by mouth daily with  breakfast.    . tadalafil (CIALIS) 5 MG tablet Take 5 mg by mouth daily. Take every day per patient     No current facility-administered medications for this visit.    Allergies:   Review of patient's allergies indicates no known allergies.   Social History:  The patient  reports that he quit smoking about 47 years ago. His smoking use included Cigarettes. He has a 8 pack-year smoking history. He has never used smokeless tobacco. He reports that he drinks about 3.6 oz of alcohol per week. He reports that he does not use illicit drugs.   Family History:  The patient's family history includes Stroke in an other family member. ROS:  Please see the history of present illness. Otherwise, review of systems is positive for    All other systems are reviewed and otherwise negative.   PHYSICAL EXAM:  VS:  There were no vitals taken for this visit. BMI: There is no weight on file to calculate BMI. Well nourished, well developed, in no acute distress HEENT: normocephalic, atraumatic Neck: no JVD, carotid bruits or masses Cardiac:  normal S1, S2; RRR; no significant murmurs, no rubs, or gallops Lungs:  clear to auscultation bilaterally, no wheezing, rhonchi or rales Abd: soft, nontender,  + BS MS: no deformity or atrophy Ext: no edema Skin: warm and dry,  no rash Neuro:  No gross deficits appreciated Psych: euthymic mood, full affect  EP sites right groin, right IJ without complication   EKG:  Done today shows   9/8/216 Echocardiogram Study Conclusions - Left ventricle: The cavity size was normal. Wall thickness was increased in a pattern of moderate LVH. Systolic function was mildly reduced. The estimated ejection fraction was in the range of 45% to 50%. Wall motion was normal; there were no regional wall motion abnormalities. - Pericardium, extracardiac: A trivial pericardial effusion was identified.  05/05/15 EPS, ablation of typical atrial flutter CONCLUSIONS:  1.  Isthmus-dependent right atrial flutter upon presentation.  2. Successful radiofrequency ablation of atrial flutter along the cavotricuspid isthmus with complete bidirectional isthmus block achieved.  3. No inducible arrhythmias following ablation.  4. No early apparent complications.  Recent Labs: 04/30/2015: TSH 2.553 05/01/2015: ALT 31 05/04/2015: BUN 19; Creatinine, Ser 0.98; Potassium 4.0; Sodium 138 05/05/2015: Hemoglobin 15.3; Platelets 233   Wt Readings from Last 3 Encounters:  05/20/15 155 lb 11.2 oz (70.625 kg)  05/05/15 152 lb 8 oz (69.174 kg)     Other studies reviewed: Additional studies/records reviewed today include: summarized above  ASSESSMENT AND PLAN:    Disposition: F/u with   Current medicines are reviewed at length with the patient today.  The patient did not have any concerns regarding medicines.  Haywood Lasso, PA-C 06/24/2015 12:08 PM     [place] [address] [phone] (office)  [phone] (fax)    This encounter was created in error - please disregard.

## 2015-07-07 NOTE — Progress Notes (Addendum)
Cardiology Office Note Date: 07/08/15 Patient ID: Juan Shaffer, Juan Shaffer 08-23-1941, MRN [id number] PCP: Velna Ochs, MD Cardiologist: (new) Dr. Debara Pickett Electrophysiologist: [name]  Chief Complaint:  Planned post ablation follow up  History of Present Illness: Juan Shaffer is a 74 y.o. male with history of AFlutter, s/p ablation 05/05/15, hyperlipidemia, as well as prostate cancer and PMR.   He feels very well, denies any kind of CP, palpitations or SOB, no dizziness, near syncope or syncope.      Past Medical History  Diagnosis Date  . High cholesterol   . Polymyalgia rheumatica     followed by Dr Charlestine Night  . Prostate cancer   . Atrial flutter with rapid ventricular response 04/30/2015    chads2vasc score is 2  . 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 Prostatectomy  12/2014  . Tonsillectomy  ~ 1946  . Electrophysiologic study N/A 05/05/2015    Procedure: A-Flutter Ablation; Surgeon: Evans Lance, MD; Location: [facility]; Service: Cardiovascular; Laterality: N/A;    Current Outpatient Prescriptions  Medication Sig Dispense Refill  . alendronate (FOSAMAX) 70 MG tablet Take 70 mg by mouth once a week. Take with a full glass of water on an empty stomach. Sundays (Days vary per patient). He did states he taken it yesterday (on a Tuesday) per patient    . atorvastatin (LIPITOR) 20 MG tablet Take 20 mg by mouth every evening.     . calcium carbonate (TUMS - DOSED IN MG ELEMENTAL CALCIUM) 500 MG chewable tablet Chew 1 tablet by mouth 2 (two) times daily.    . Cholecalciferol (VITAMIN D-3 PO) Take 1 tablet by mouth daily.    . dabigatran (PRADAXA) 150 MG CAPS capsule Take 1 capsule (150 mg total) by mouth every 12 (twelve) hours. 60 capsule 11  . glucosamine-chondroitin 500-400 MG tablet Take 1 tablet by mouth daily.    . metoprolol tartrate  (LOPRESSOR) 25 MG tablet Take 1 tablet (25 mg total) by mouth 2 (two) times daily. 60 tablet 11  . omega-3 acid ethyl esters (LOVAZA) 1 G capsule Take 1 g by mouth daily.    . predniSONE (DELTASONE) 5 MG tablet Take 5 mg by mouth daily with breakfast.    . tadalafil (CIALIS) 5 MG tablet Take 5 mg by mouth daily. Take every day per patient         Allergies: Review of patient's allergies indicates no known allergies.   Social History: The patient  reports that he quit smoking about 47 years ago. His smoking use included Cigarettes. He has a 8 pack-year smoking history. He has never used smokeless tobacco. He reports that he drinks about 3.6 oz of alcohol per week. He reports that he does not use illicit drugs.   Family History: The patient's family history includes Stroke in an other family member. ROS: Please see the history of present illness. Otherwise, review of systems is positive for  All other systems are reviewed and otherwise negative.   PHYSICAL EXAM:  128/70,  60bpm, 163lbs. Well nourished, well developed, in no acute distress  HEENT: normocephalic, atraumatic  Neck: no JVD, carotid bruits or masses Cardiac: normal S1, S2; RRR; no significant murmurs, no rubs, or gallops Lungs: clear to auscultation bilaterally, no wheezing, rhonchi or rales  Abd: soft, nontender, + BS MS: no deformity or atrophy Ext: no edema  Skin: warm  and dry, no rash Neuro: No gross deficits appreciated Psych: euthymic mood, full affect   EKG: Done today shows SR 60bpm  9/8/216 Echocardiogram Study Conclusions - Left ventricle: The cavity size was normal. Wall thickness was increased in a pattern of moderate LVH. Systolic function was mildly reduced. The estimated ejection fraction was in the range of 45% to 50%. Wall motion was normal; there were no regional wall motion abnormalities. - Pericardium, extracardiac: A trivial pericardial effusion  was identified.  05/05/15 EPS, ablation of typical atrial flutter CONCLUSIONS:  1. Isthmus-dependent right atrial flutter upon presentation.  2. Successful radiofrequency ablation of atrial flutter along the cavotricuspid isthmus with complete bidirectional isthmus block achieved.  3. No inducible arrhythmias following ablation.  4. No early apparent complications.  Recent Labs: 04/30/2015: TSH 2.553 05/01/2015: ALT 31 05/04/2015: BUN 19; Creatinine, Ser 0.98; Potassium 4.0; Sodium 138 05/05/2015: Hemoglobin 15.3; Platelets 233   Wt Readings from Last 3 Encounters:  05/20/15 155 lb 11.2 oz (70.625 kg)  05/05/15 152 lb 8 oz (69.174 kg)     Other studies reviewed: Additional studies/records reviewed today include hospital records and prior office visit: summarized above  ASSESSMENT AND PLAN: 1. Typical  A flutter s/p ablation 05/05/15      The patient feels well, no palpitation, though was not particularly symptomatic with his Aflutter      We will stop his Pradaxa .  He does not carry HTN by history, appears started on the Lopressor at the time of the Aflutter hospitalization.  He will take the lopressor daily for 3 days, then QOD for 2 doses then stop it.       He is asked to monitor his HR and BP daily, and notify us of BP >140/90 or elevated HR as well.  ADDENDUM:  Re-discussed patient with [name], given his EF, will continue the lopressor until his f/u echo is completed and seen in follow up at his next visit.  The patient was called and notified of this.  2. Hyperlipidemia     He follows this with his PMD  3. He had systolic dysfunction felt likely tachycardia mediated by records, we will plan to get an echo doppler prior to his f/u next month.     He appears euvolemic here today   Disposition: F/u with [name] 62month  Current medicines are reviewed at length with the patient today. The patient did not have any concerns regarding medicines.  Signed, [name],  PA-C  07/08/15  [place] [address] [phone] (office)  [phone] (fax)

## 2015-07-08 ENCOUNTER — Encounter: Payer: Self-pay | Admitting: Physician Assistant

## 2015-07-08 ENCOUNTER — Ambulatory Visit (INDEPENDENT_AMBULATORY_CARE_PROVIDER_SITE_OTHER): Payer: PRIVATE HEALTH INSURANCE | Admitting: Physician Assistant

## 2015-07-08 ENCOUNTER — Telehealth: Payer: Self-pay | Admitting: Physician Assistant

## 2015-07-08 VITALS — BP 128/70 | HR 60 | Ht 69.0 in | Wt 163.0 lb

## 2015-07-08 DIAGNOSIS — I519 Heart disease, unspecified: Secondary | ICD-10-CM | POA: Diagnosis not present

## 2015-07-08 DIAGNOSIS — I4892 Unspecified atrial flutter: Secondary | ICD-10-CM

## 2015-07-08 DIAGNOSIS — I429 Cardiomyopathy, unspecified: Secondary | ICD-10-CM | POA: Diagnosis not present

## 2015-07-08 DIAGNOSIS — E785 Hyperlipidemia, unspecified: Secondary | ICD-10-CM | POA: Diagnosis not present

## 2015-07-08 NOTE — Addendum Note (Signed)
Addended by: Claude Manges on: 07/08/2015 09:09 AM   Modules accepted: Orders

## 2015-07-08 NOTE — Patient Instructions (Addendum)
Medication Instructions:   Your physician recommends that you continue on your current medications as directed. Please refer to the Current Medication list given to you today.     If you need a refill on your cardiac medications before your next appointment, please call your pharmacy.  Labwork:  NONE ORDER TODAY    Testing/Procedures:  NONE ORDER TODAY    Follow-Up:  WITH DR Lovena Le IN ONE MONTH  AS SCHEDULED   Any Other Special Instructions Will Be Listed Below (If Applicable).

## 2015-07-11 NOTE — Telephone Encounter (Signed)
error 

## 2015-07-15 ENCOUNTER — Ambulatory Visit (HOSPITAL_COMMUNITY): Payer: PRIVATE HEALTH INSURANCE | Attending: Internal Medicine

## 2015-07-15 ENCOUNTER — Other Ambulatory Visit: Payer: Self-pay

## 2015-07-15 DIAGNOSIS — I519 Heart disease, unspecified: Secondary | ICD-10-CM

## 2015-07-15 DIAGNOSIS — I517 Cardiomegaly: Secondary | ICD-10-CM | POA: Insufficient documentation

## 2015-07-15 DIAGNOSIS — I429 Cardiomyopathy, unspecified: Secondary | ICD-10-CM | POA: Insufficient documentation

## 2015-07-15 DIAGNOSIS — I358 Other nonrheumatic aortic valve disorders: Secondary | ICD-10-CM | POA: Insufficient documentation

## 2015-07-15 DIAGNOSIS — I340 Nonrheumatic mitral (valve) insufficiency: Secondary | ICD-10-CM | POA: Diagnosis not present

## 2015-07-15 DIAGNOSIS — E785 Hyperlipidemia, unspecified: Secondary | ICD-10-CM | POA: Insufficient documentation

## 2015-07-15 DIAGNOSIS — I351 Nonrheumatic aortic (valve) insufficiency: Secondary | ICD-10-CM | POA: Diagnosis not present

## 2015-07-16 ENCOUNTER — Telehealth: Payer: Self-pay | Admitting: *Deleted

## 2015-08-08 ENCOUNTER — Ambulatory Visit (INDEPENDENT_AMBULATORY_CARE_PROVIDER_SITE_OTHER): Payer: PRIVATE HEALTH INSURANCE | Admitting: Internal Medicine

## 2015-08-08 ENCOUNTER — Encounter: Payer: Self-pay | Admitting: Internal Medicine

## 2015-08-08 VITALS — BP 120/82 | HR 59 | Ht 69.0 in | Wt 169.6 lb

## 2015-08-08 DIAGNOSIS — G4730 Sleep apnea, unspecified: Secondary | ICD-10-CM

## 2015-08-08 DIAGNOSIS — I5021 Acute systolic (congestive) heart failure: Secondary | ICD-10-CM | POA: Diagnosis not present

## 2015-08-08 DIAGNOSIS — R0681 Apnea, not elsewhere classified: Secondary | ICD-10-CM

## 2015-08-08 DIAGNOSIS — I4892 Unspecified atrial flutter: Secondary | ICD-10-CM | POA: Diagnosis not present

## 2015-08-08 NOTE — Progress Notes (Signed)
HPI Mr. Conboy returns today for followup. He is a pleasant 74 yo man with atrial flutter, who underwent catheter ablation several months ago. He had mild LV dysfunction and since his ablation and return to NSR, he feels well. His repeat echo demonstrated normalization of his LV function. He c/o snoring and his wife stated that he stops breathing when he sleeps at night. No other complaints.  No Known Allergies   Current Outpatient Prescriptions  Medication Sig Dispense Refill  . alendronate (FOSAMAX) 70 MG tablet Take 70 mg by mouth once a week. Take with a full glass of water on an empty stomach. Sundays (Days vary per patient). He did states he taken it yesterday (on a Tuesday) per patient    . atorvastatin (LIPITOR) 20 MG tablet Take 20 mg by mouth every evening.     . calcium carbonate (TUMS - DOSED IN MG ELEMENTAL CALCIUM) 500 MG chewable tablet Chew 1 tablet by mouth 2 (two) times daily.    . Cholecalciferol (VITAMIN D-3 PO) Take 1 tablet by mouth daily.    . dabigatran (PRADAXA) 150 MG CAPS capsule Take 1 capsule (150 mg total) by mouth every 12 (twelve) hours. 60 capsule 11  . glucosamine-chondroitin 500-400 MG tablet Take 1 tablet by mouth daily.    . metoprolol tartrate (LOPRESSOR) 25 MG tablet Take 25 mg by mouth daily.    Marland Kitchen omega-3 acid ethyl esters (LOVAZA) 1 G capsule Take 1 g by mouth daily.    . predniSONE (DELTASONE) 5 MG tablet Take 5 mg by mouth daily with breakfast.     No current facility-administered medications for this visit.     Past Medical History  Diagnosis Date  . High cholesterol   . Polymyalgia rheumatica (HCC)     followed by Dr Charlestine Night  . Prostate cancer ([facility])   . Atrial flutter with rapid ventricular response ([facility]) 04/30/2015    chads2vasc score is 2  . 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   Past Surgical History  Procedure Laterality Date   . Prostatectomy  12/2014  . Tonsillectomy  ~ 1946  . Electrophysiologic study N/A 05/05/2015    Procedure: A-Flutter Ablation;  Surgeon: Evans Lance, MD;  Location: [facility];  Service: Cardiovascular;  Laterality: N/A;     Family History  Problem Relation Age of Onset  . Stroke       Social History   Social History  . Marital Status: Married    Spouse Name: N/A  . Number of Children: N/A  . Years of Education: N/A   Occupational History  . Not on file.   Social History Main Topics  . Smoking status: Former Smoker -- 1.00 packs/day for 8 years    Types: Cigarettes    Quit date: 10/22/1967  . Smokeless tobacco: Never Used  . Alcohol Use: 3.6 oz/week    3 Glasses of wine, 3 Shots of liquor per week  . Drug Use: No  . Sexual Activity: Not Currently   Other Topics Concern  . Not on file   Social History Narrative   Lives in [place]   Owns a photographer business     BP 120/82 mmHg  Pulse 59  Ht 5\' 9"  (1.753 m)  Wt 169 lb 9.6 oz (76.93 kg)  BMI 25.03 kg/m2  Physical Exam:  Well  appearing 74 yo man, NAD HEENT: Unremarkable Neck:  6 cm JVD, no thyromegally Lymphatics:  No adenopathy Back:  No CVA tenderness Lungs:  Clear with no wheezes HEART:  Regular rate rhythm, no murmurs, no rubs, no clicks Abd:  soft, positive bowel sounds, no organomegally, no rebound, no guarding Ext:  2 plus pulses, no edema, no cyanosis, no clubbing Skin:  No rashes no nodules Neuro:  CN II through XII intact, motor grossly intact  EKG - NSR with LAE  DEVICE  Normal device function.  See PaceArt for details.   Assess/Plan:

## 2015-08-08 NOTE — Assessment & Plan Note (Signed)
He is maintaining NSR. He will continue his current meds. He has stopped anticoagulation and will stop his beta blocker.

## 2015-08-08 NOTE — Assessment & Plan Note (Signed)
This has resolved and repeat echo demonstrated normalization of his LV function. We will stop metoprolol.

## 2015-08-08 NOTE — Patient Instructions (Signed)
Medication Instructions:  Your physician recommends that you continue on your current medications as directed. Please refer to the Current Medication list given to you today.     Labwork: None ordered   Testing/Procedures:   Your physician has recommended that you have a sleep study. This test records several body functions during sleep, including: brain activity, eye movement, oxygen and carbon dioxide blood levels, heart rate and rhythm, breathing rate and rhythm, the flow of air through your mouth and nose, snoring, body muscle movements, and chest and belly movement.   Follow-Up: Your physician recommends that you schedule a follow-up appointment as needed  If sleep study abnormal will need to be referred to Dr Radford Pax   Any Other Special Instructions Will Be Listed Below (If Applicable).     If you need a refill on your cardiac medications before your next appointment, please call your pharmacy.

## 2015-08-08 NOTE — Assessment & Plan Note (Signed)
His symptoms are consistent with this diagnosis but he has not undergone testing. Will arrange a sleep study.

## 2015-08-21 ENCOUNTER — Ambulatory Visit: Payer: Medicare Other | Admitting: Internal Medicine

## 2015-08-27 NOTE — Telephone Encounter (Signed)
SPOKE TO PATIENT ABOUT RESULTS

## 2015-10-20 ENCOUNTER — Encounter (HOSPITAL_BASED_OUTPATIENT_CLINIC_OR_DEPARTMENT_OTHER): Payer: 59

## 2015-11-04 DIAGNOSIS — M2550 Pain in unspecified joint: Secondary | ICD-10-CM | POA: Insufficient documentation

## 2015-11-04 DIAGNOSIS — I471 Supraventricular tachycardia: Secondary | ICD-10-CM | POA: Insufficient documentation

## 2015-11-04 DIAGNOSIS — I4719 Other supraventricular tachycardia: Secondary | ICD-10-CM | POA: Insufficient documentation

## 2015-11-04 DIAGNOSIS — N529 Male erectile dysfunction, unspecified: Secondary | ICD-10-CM | POA: Insufficient documentation

## 2015-11-04 DIAGNOSIS — D126 Benign neoplasm of colon, unspecified: Secondary | ICD-10-CM | POA: Insufficient documentation

## 2015-11-04 DIAGNOSIS — L729 Follicular cyst of the skin and subcutaneous tissue, unspecified: Secondary | ICD-10-CM | POA: Insufficient documentation

## 2015-11-04 DIAGNOSIS — R430 Anosmia: Secondary | ICD-10-CM | POA: Insufficient documentation

## 2015-11-05 DIAGNOSIS — Z0000 Encounter for general adult medical examination without abnormal findings: Secondary | ICD-10-CM | POA: Insufficient documentation

## 2015-11-09 ENCOUNTER — Ambulatory Visit (HOSPITAL_BASED_OUTPATIENT_CLINIC_OR_DEPARTMENT_OTHER): Payer: 59 | Attending: Internal Medicine

## 2015-11-09 VITALS — Ht 69.0 in | Wt 157.0 lb

## 2015-11-09 DIAGNOSIS — G4733 Obstructive sleep apnea (adult) (pediatric): Secondary | ICD-10-CM | POA: Insufficient documentation

## 2015-11-09 DIAGNOSIS — R0681 Apnea, not elsewhere classified: Secondary | ICD-10-CM

## 2015-11-09 DIAGNOSIS — R0683 Snoring: Secondary | ICD-10-CM | POA: Insufficient documentation

## 2015-11-09 DIAGNOSIS — R5383 Other fatigue: Secondary | ICD-10-CM | POA: Insufficient documentation

## 2015-11-09 DIAGNOSIS — I483 Typical atrial flutter: Secondary | ICD-10-CM | POA: Insufficient documentation

## 2015-11-09 DIAGNOSIS — Z79899 Other long term (current) drug therapy: Secondary | ICD-10-CM | POA: Insufficient documentation

## 2015-11-09 DIAGNOSIS — Z7901 Long term (current) use of anticoagulants: Secondary | ICD-10-CM | POA: Insufficient documentation

## 2015-11-12 ENCOUNTER — Ambulatory Visit (INDEPENDENT_AMBULATORY_CARE_PROVIDER_SITE_OTHER): Payer: 59 | Admitting: Internal Medicine

## 2015-11-12 ENCOUNTER — Encounter: Payer: Self-pay | Admitting: Internal Medicine

## 2015-11-12 VITALS — BP 128/72 | HR 56 | Ht 69.0 in | Wt 164.0 lb

## 2015-11-12 DIAGNOSIS — I481 Persistent atrial fibrillation: Secondary | ICD-10-CM | POA: Diagnosis not present

## 2015-11-12 DIAGNOSIS — I4819 Other persistent atrial fibrillation: Secondary | ICD-10-CM

## 2015-11-12 NOTE — Patient Instructions (Signed)
Medication Instructions:  Your physician recommends that you continue on your current medications as directed. Please refer to the Current Medication list given to you today.   Labwork: None ordered   Testing/Procedures: None ordered   Follow-Up: Your physician recommends that you schedule a follow-up appointment in: 3 months with Dr Taylor   Any Other Special Instructions Will Be Listed Below (If Applicable).     If you need a refill on your cardiac medications before your next appointment, please call your pharmacy.   

## 2015-11-12 NOTE — Progress Notes (Signed)
HPI Juan Shaffer returns today for followup. He is a pleasant 75 yo man with atrial flutter, who underwent catheter ablation several months ago. His repeat echo demonstrated normalization of his LV function. He c/o snoring and his wife stated that he stops breathing when he sleeps at night. He was found to be in atrial fib with an RVR last week and has been placed on atenolol. He has returned to NSR. No other complaints.  No Known Allergies   Current Outpatient Prescriptions  Medication Sig Dispense Refill  . alendronate (FOSAMAX) 70 MG tablet Take 70 mg by mouth once a week. Take with a full glass of water on an empty stomach. Sundays (Days vary per patient). He did states he taken it yesterday (on a Tuesday) per patient    . atenolol (TENORMIN) 50 MG tablet Take 50 mg by mouth 2 (two) times daily.    . calcium carbonate (TUMS EX) 750 MG chewable tablet Chew 2 tablets by mouth daily.    . Cholecalciferol (VITAMIN D-3 PO) Take 1 tablet by mouth daily.    Marland Kitchen CIALIS 5 MG tablet Take 5 mg by mouth daily as needed. (ED)  2  . glucosamine-chondroitin 500-400 MG tablet Take 1 tablet by mouth daily.    Marland Kitchen omega-3 acid ethyl esters (LOVAZA) 1 G capsule Take 1 g by mouth daily.    . predniSONE (DELTASONE) 5 MG tablet Take 5 mg by mouth daily with breakfast.    . rivaroxaban (XARELTO) 20 MG TABS tablet Take 20 mg by mouth daily with supper.    . Vitamins/Minerals TABS Take 1 capsule by mouth 2 (two) times daily.     No current facility-administered medications for this visit.     Past Medical History  Diagnosis Date  . High cholesterol   . Polymyalgia rheumatica (HCC)     followed by Dr Charlestine Night  . Prostate cancer ([facility])   . Atrial flutter with rapid ventricular response ([facility]) 04/30/2015    chads2vasc score is 2  . 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   Past Surgical History  Procedure  Laterality Date  . Prostatectomy  12/2014  . Tonsillectomy  ~ 1946  . Electrophysiologic study N/A 05/05/2015    Procedure: A-Flutter Ablation;  Surgeon: Evans Lance, MD;  Location: [facility];  Service: Cardiovascular;  Laterality: N/A;     Family History  Problem Relation Age of Onset  . Stroke    . Parkinson's disease Mother   . Arthritis Father      Social History   Social History  . Marital Status: Married    Spouse Name: N/A  . Number of Children: N/A  . Years of Education: N/A   Occupational History  . Not on file.   Social History Main Topics  . Smoking status: Former Smoker -- 1.00 packs/day for 8 years    Types: Cigarettes    Quit date: 10/22/1967  . Smokeless tobacco: Never Used  . Alcohol Use: 3.6 oz/week    3 Glasses of wine, 3 Shots of liquor per week  . Drug Use: No  . Sexual Activity: Not Currently   Other Topics Concern  . Not on file   Social History Narrative   Lives in [place]   Owns a photographer business     BP 128/72 mmHg  Pulse 56  Ht 5\' 9"  (1.753 m)  Wt 164 lb (74.39 kg)  BMI 24.21 kg/m2  Physical Exam:  Well appearing 75 yo man, NAD HEENT: Unremarkable Neck:  6 cm JVD, no thyromegally Lymphatics:  No adenopathy Back:  No CVA tenderness Lungs:  Clear with no wheezes HEART:  Regular rate rhythm, no murmurs, no rubs, no clicks Abd:  soft, positive bowel sounds, no organomegally, no rebound, no guarding Ext:  2 plus pulses, no edema, no cyanosis, no clubbing Skin:  No rashes no nodules Neuro:  CN II through XII intact, motor grossly intact  EKG - reviewed from 3/15 - atrial fib with a RVR   Assess/Plan: 1. New onset atrial fib - he has reverted to NSR. I have asked the patient to continue his atenolol and anti-coagulation and to check his HR and BP. He will return in 3 months. If he has more atrial fib, will consider AA drug therapy. I have asked him to limit his ETOH consumption to one drink a day. 2. HTN - his  blood pressure is controlled. No change in meds. 3. Dyslipidemia - he will continue his current meds. Hopefully he will be able to be weaned off of prednisone 4. Atrial flutter - we discussed the differences between atrial fib and flutter. He has not had any flutter.  Mikle Bosworth.D.

## 2015-11-14 ENCOUNTER — Telehealth: Payer: Self-pay | Admitting: *Deleted

## 2015-11-14 MED ORDER — ATENOLOL 50 MG PO TABS: 50.0000 mg | ORAL_TABLET | Freq: Two times a day (BID) | ORAL | Status: DC

## 2015-11-14 MED ORDER — RIVAROXABAN 20 MG PO TABS: 20.0000 mg | ORAL_TABLET | Freq: Every day | ORAL | Status: DC

## 2015-11-14 NOTE — Telephone Encounter (Signed)
Refilled xarelto & atenolol

## 2015-11-16 ENCOUNTER — Encounter (HOSPITAL_BASED_OUTPATIENT_CLINIC_OR_DEPARTMENT_OTHER): Payer: Self-pay

## 2015-11-16 ENCOUNTER — Telehealth: Payer: Self-pay | Admitting: Cardiology

## 2015-11-16 DIAGNOSIS — G4733 Obstructive sleep apnea (adult) (pediatric): Secondary | ICD-10-CM

## 2015-11-16 NOTE — Sleep Study (Cosign Needed)
   Patient Name: Juan Shaffer, Juan Shaffer MRN: [id number] Study Date: 11/09/2015 Gender: Male D.O.B: 13-Nov-1940 Age (years): 61 Referring Provider: Fransico Him MD, ABSM Interpreting Physician: Fransico Him MD, ABSM RPSGT: Baxter Flattery  BMI: 23 Weight (lbs): 157 Height (inches): 69 Neck Size: 16.50  CLINICAL INFORMATION Sleep Study Type: NPSG Indication for sleep study: Fatigue, OSA, Snoring, Witnessed Apneas Epworth Sleepiness Score: 3  SLEEP STUDY TECHNIQUE As per the AASM Manual for the Scoring of Sleep and Associated Events v2.3 (April 2016) with a hypopnea requiring 4% desaturations. The channels recorded and monitored were frontal, central and occipital EEG, electrooculogram (EOG), submentalis EMG (chin), nasal and oral airflow, thoracic and abdominal wall motion, anterior tibialis EMG, snore microphone, electrocardiogram, and pulse oximetry.  MEDICATIONS Patient's medications include: Fosamax, Atenolol, Vit D3, Cialis, Lovaza, Prednisone, Xarelto.   Medications self-administered by patient during sleep study : No sleep medicine administered.  SLEEP ARCHITECTURE The study was initiated at 10:49:37 PM and ended at 5:15:35 AM. Sleep onset time was 25.9 minutes and the sleep efficiency was mildly reduced at 80.5%. The total sleep time was 310.5 minutes. Stage REM latency was 43.0 minutes. The patient spent 4.35% of the night in stage N1 sleep, 84.06% in stage N2 sleep, 0.00% in stage N3 and 11.59% in REM. Alpha intrusion was absent. Supine sleep was 25.27%.  RESPIRATORY PARAMETERS The overall apnea/hypopnea index (AHI) was 12.6 per hour. There were 42 total apneas, including 37 obstructive, 1 central and 4 mixed apneas. There were 23 hypopneas and 0 RERAs. The AHI during Stage REM sleep was 3.3 per hour. AHI while supine was 13.0 per hour. The mean oxygen saturation was 94.36%. The minimum SpO2 during sleep was 85.00%. Loud snoring was noted during this study.  CARDIAC  DATA The 2 lead EKG demonstrated sinus rhythm. The mean heart rate was 55.76 beats per minute. Other EKG findings include: None.  LEG MOVEMENT DATA The total PLMS were 52 with a resulting PLMS index of 10.05. Associated arousal with leg movement index was 0.4 .  IMPRESSIONS - Mild obstructive sleep apnea occurred during this study (AHI = 12.6/h). - No significant central sleep apnea occurred during this study (CAI = 0.2/h). - Mild oxygen desaturation was noted during this study (Min O2 = 85.00%). - The patient snored with Loud snoring volume. - No cardiac abnormalities were noted during this study. - Mild periodic limb movements of sleep occurred during the study. No significant associated arousals.  DIAGNOSIS - Obstructive Sleep Apnea (327.23 [G47.33 ICD-10])  RECOMMENDATIONS - In light of patient's history of cardiac arrhythmias with Paroxysmal atrial fibrillatio, recommend therapeutic CPAP titration to determine optimal pressure required to alleviate sleep disordered breathing. - Avoid alcohol, sedatives and other CNS depressants that may worsen sleep apnea and disrupt normal sleep architecture. - Sleep hygiene should be reviewed to assess factors that may improve sleep quality. - Weight management and regular exercise should be initiated or continued if appropriate.    Sueanne Margarita Diplomate, American Board of Sleep Medicine  ELECTRONICALLY SIGNED ON:  11/16/2015, 6:44 PM [place] PH: [phone]   FX: [phone] [place]

## 2015-11-16 NOTE — Telephone Encounter (Signed)
Please let patient know that they have sleep apnea and recommend CPAP titration. Please set up titration in the sleep lab. 

## 2015-11-25 NOTE — Telephone Encounter (Signed)
Patient informed of information.  Stated verbal understanding.   He is okay to proceed with Titration. Will precert with insurance and get him scheduled

## 2015-12-08 NOTE — Telephone Encounter (Signed)
Clinical notes have been prior-authorization department per their request.

## 2015-12-12 NOTE — Telephone Encounter (Signed)
Approval still pending, per [employer] 

## 2015-12-30 NOTE — Addendum Note (Signed)
Addended by: Andres Ege on: 12/30/2015 12:56 PM   Modules accepted: Orders

## 2015-12-30 NOTE — Telephone Encounter (Signed)
Auto BiPAP titration from 4-18cm H2O with mask of choice and heated humidifier

## 2015-12-30 NOTE — Telephone Encounter (Signed)
Patient is aware that lab titration was not approved.   He is okay with proceeding with Auto CPAP.   Orders are in, [facility] notified.

## 2015-12-30 NOTE — Telephone Encounter (Signed)
[facility] denied in lab titration.     Can we order an Auto CPAP with [facility] for titration?

## 2016-02-06 ENCOUNTER — Telehealth: Payer: Self-pay | Admitting: Internal Medicine

## 2016-02-06 NOTE — Telephone Encounter (Signed)
New Message    Pt call requesting to speak with RN  Pt c/o swelling: STAT is pt has developed SOB within 24 hours  1. How long have you been experiencing swelling? 2 months  2. Where is the swelling located? Feet to ankles   3.  Are you currently taking a "fluid pill"? No  4.  Are you currently SOB? No  5.  Have you traveled recently? Yeah Feb -Mar  [place] of country   Pt states he wants to know could Xarelto 20 mg, Atenolol 50 mg, or  Prednisone 5 mg could be the reason why he is still having swelling. pleae call back to discuss

## 2016-02-06 NOTE — Telephone Encounter (Signed)
Left message on machine for patient to return my call when he can to discuss on Monday

## 2016-02-09 NOTE — Telephone Encounter (Signed)
Patient called me back and his swelling has resolved.  He has a 3 month follow up scheduled for 6/22.  He will keep as scheduled

## 2016-02-12 ENCOUNTER — Encounter: Payer: Self-pay | Admitting: Internal Medicine

## 2016-02-12 ENCOUNTER — Ambulatory Visit (INDEPENDENT_AMBULATORY_CARE_PROVIDER_SITE_OTHER): Payer: 59 | Admitting: Internal Medicine

## 2016-02-12 VITALS — BP 112/68 | HR 54 | Ht 69.0 in | Wt 164.6 lb

## 2016-02-12 DIAGNOSIS — I480 Paroxysmal atrial fibrillation: Secondary | ICD-10-CM | POA: Diagnosis not present

## 2016-02-12 NOTE — Patient Instructions (Signed)
Medication Instructions:  Your physician recommends that you continue on your current medications as directed. Please refer to the Current Medication list given to you today.  Labwork: None ordered  Testing/Procedures: None ordered  Follow-Up: Your physician wants you to follow-up in: 6 months with [name]. You will receive a reminder letter in the mail two months in advance. If you don't receive a letter, please call our office to schedule the follow-up appointment.  If you need a refill on your cardiac medications before your next appointment, please call your pharmacy.  Thank you for choosing [facility]!!       c

## 2016-02-12 NOTE — Progress Notes (Signed)
HPI Mr. Delly returns today for followup. He is a pleasant 75 yo man with atrial flutter, who underwent catheter ablation in the past. His repeat echo demonstrated normalization of his LV function. He has a h/o atrial fib and has been placed on atenolol. He has not had any symptomatic atrial fib. No other complaints.  No Known Allergies   Current Outpatient Prescriptions  Medication Sig Dispense Refill  . alendronate (FOSAMAX) 70 MG tablet Take 70 mg by mouth once a week. Take with a full glass of water on an empty stomach. Sundays (Days vary per patient). He did states he taken it yesterday (on a Tuesday) per patient    . atenolol (TENORMIN) 50 MG tablet Take 1 tablet (50 mg total) by mouth 2 (two) times daily. 180 tablet 3  . calcium carbonate (TUMS EX) 750 MG chewable tablet Chew 2 tablets by mouth daily.    . Cholecalciferol (VITAMIN D-3 PO) Take 1 tablet by mouth daily.    Marland Kitchen glucosamine-chondroitin 500-400 MG tablet Take 1 tablet by mouth daily.    Marland Kitchen omega-3 acid ethyl esters (LOVAZA) 1 G capsule Take 1 g by mouth daily.    . rivaroxaban (XARELTO) 20 MG TABS tablet Take 1 tablet (20 mg total) by mouth daily with supper. 90 tablet 3  . tadalafil (CIALIS) 5 MG tablet Take 5 mg by mouth as directed.    . Vitamins/Minerals TABS Take 1 capsule by mouth 2 (two) times daily.     No current facility-administered medications for this visit.     Past Medical History  Diagnosis Date  . High cholesterol   . Polymyalgia rheumatica (HCC)     followed by Dr Charlestine Night  . Prostate cancer ([place])   . Atrial flutter with rapid ventricular response ([place]) 04/30/2015    chads2vasc score is 2  . 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 OSA (obstructive sleep apnea)     mild with AHI 12/hr  . OSA (obstructive sleep apnea)     mild with AHI 12/hr     ROS:   All systems reviewed and negative except as noted in the HPI.   Past Surgical History  Procedure Laterality  Date  . Prostatectomy  12/2014  . Tonsillectomy  ~ 1946  . Electrophysiologic study N/A 05/05/2015    Procedure: A-Flutter Ablation;  Surgeon: Evans Lance, MD;  Location: [place] [facility];  Service: Cardiovascular;  Laterality: N/A;     Family History  Problem Relation Age of Onset  . Stroke    . Parkinson's disease Mother   . Arthritis Father      Social History   Social History  . Marital Status: Married    Spouse Name: N/A  . Number of Children: N/A  . Years of Education: N/A   Occupational History  . Not on file.   Social History Main Topics  . Smoking status: Former Smoker -- 1.00 packs/day for 8 years    Types: Cigarettes    Quit date: 10/22/1967  . Smokeless tobacco: Never Used  . Alcohol Use: 3.6 oz/week    3 Glasses of wine, 3 Shots of liquor per week  . Drug Use: No  . Sexual Activity: Not Currently   Other Topics Concern  . Not on file   Social History Narrative   Lives in [place]   Owns a photographer business     BP 112/68 mmHg  Pulse 54  Ht 5\' 9"  (1.753 m)  Wt 164 lb 9.6 oz (74.662 kg)  BMI 24.30 kg/m2  SpO2 97%  Physical Exam:  Well appearing 75 yo man, NAD HEENT: Unremarkable Neck:  6 cm JVD, no thyromegally Lymphatics:  No adenopathy Back:  No CVA tenderness Lungs:  Clear with no wheezes HEART:  Regular rate rhythm, no murmurs, no rubs, no clicks Abd:  soft, positive bowel sounds, no organomegally, no rebound, no guarding Ext:  2 plus pulses, no edema, no cyanosis, no clubbing Skin:  No rashes no nodules Neuro:  CN II through XII intact, motor grossly intact  EKG - Sinus bradycardia   Assess/Plan: 1. New onset atrial fib - he has maintained NSR. I have asked the patient to continue his atenolol and anti-coagulation. If he has more atrial fib, will consider AA drug therapy. I have asked him to limit his ETOH consumption to one drink a day. 2. HTN - his blood pressure is controlled. No change in meds. 3. Dyslipidemia - he  will continue his current meds. Hopefully he will be able to be weaned off of prednisone  Mikle Bosworth.D.

## 2016-02-26 ENCOUNTER — Ambulatory Visit: Payer: 59 | Admitting: Internal Medicine

## 2016-04-28 ENCOUNTER — Encounter: Payer: Self-pay | Admitting: Cardiology

## 2016-04-29 ENCOUNTER — Encounter: Payer: Self-pay | Admitting: Cardiology

## 2016-04-29 ENCOUNTER — Ambulatory Visit (INDEPENDENT_AMBULATORY_CARE_PROVIDER_SITE_OTHER): Payer: 59 | Admitting: Cardiology

## 2016-04-29 VITALS — BP 130/70 | HR 56 | Ht 69.0 in | Wt 167.4 lb

## 2016-04-29 DIAGNOSIS — G4733 Obstructive sleep apnea (adult) (pediatric): Secondary | ICD-10-CM | POA: Diagnosis not present

## 2016-04-29 NOTE — Patient Instructions (Signed)
Medication Instructions:  Your physician recommends that you continue on your current medications as directed. Please refer to the Current Medication list given to you today.   Labwork: None  Testing/Procedures: None  Follow-Up: Your physician wants you to follow-up in: 1 year with Dr. Radford Pax. You will receive a reminder letter in the mail two months in advance. If you don't receive a letter, please call our office to schedule the follow-up appointment.   Any Other Special Instructions Will Be Listed Below (If Applicable). Your CPAP is to be set at 9cm H2O.  Romelle Starcher, CMA is our office CPAP assistant. If you have any questions or concerns CPAP related, please call her directly at [phone].   If you need a refill on your cardiac medications before your next appointment, please call your pharmacy.

## 2016-04-29 NOTE — Progress Notes (Signed)
Cardiology Office Note    Date:  04/29/2016   ID:  BROOKS CONNER, DOB 1941/06/21, MRN [id number]  PCP:  Velna Ochs, MD  Cardiologist:  Fransico Him, MD   Chief Complaint  Patient presents with  . Sleep Apnea    History of Present Illness:  Juan Shaffer is a 75 y.o. male with a history of atrial flutter, mild LV dysfunction secondary to tachycardia and OSA who presents for evaluation of OSA.  He was referred for sleep evaluation due to snoring, witnessed apnea and excessive daytime fatigue.  His PSG showed mild OSA with an AHi of 12.6/hr and oxygen desaturations as low as 85%.  He underwent CPAP titration and is currently on auto with a 95% pressure of 9.6/hr.  He is 77% compliant with using more than 4 hours nightly.  He tolerates the PAP well.  He tolerates the full face mask and feels the pressure is adequate.  He denies any dry mouth or nasal congestion.  He no longer snores.  He is not sure that he has noticed any improvement in his daytime sleepiness because he has to get up to go to the bathroom but on the nights he sleeps through the night, he does feel better when he gets up in the am.      Past Medical History:  Diagnosis Date  . Atrial flutter with rapid ventricular response ([place]) 04/30/2015   chads2vasc score is 2  . High cholesterol   . OSA (obstructive sleep apnea)    mild with AHI 12/hr  . OSA (obstructive sleep apnea)    mild with AHI 12/hr   . Polymyalgia rheumatica (HCC)    followed by Dr Charlestine Night  . Prostate cancer ([place])   . 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 ELECTROPHYSIOLOGIC STUDY N/A 05/05/2015   Procedure: A-Flutter Ablation;  Surgeon: Evans Lance, MD;  Location: [place] [facility];  Service: Cardiovascular;  Laterality: N/A;  . PROSTATECTOMY  12/2014  . TONSILLECTOMY  ~ 1946    Current Medications: Outpatient Medications Prior to Visit  Medication Sig  Dispense Refill  . alendronate (FOSAMAX) 70 MG tablet Take 70 mg by mouth once a week. Take with a full glass of water on an empty stomach. Sundays (Days vary per patient). He did states he taken it yesterday (on a Tuesday) per patient    . atenolol (TENORMIN) 50 MG tablet Take 1 tablet (50 mg total) by mouth 2 (two) times daily. 180 tablet 3  . Cholecalciferol (VITAMIN D-3 PO) Take 1 tablet by mouth daily.    Marland Kitchen glucosamine-chondroitin 500-400 MG tablet Take 1 tablet by mouth daily.    Marland Kitchen omega-3 acid ethyl esters (LOVAZA) 1 G capsule Take 1 g by mouth daily.    . rivaroxaban (XARELTO) 20 MG TABS tablet Take 1 tablet (20 mg total) by mouth daily with supper. 90 tablet 3  . Vitamins/Minerals TABS Take 1 capsule by mouth 2 (two) times daily.    . calcium carbonate (TUMS EX) 750 MG chewable tablet Chew 2 tablets by mouth daily.    . tadalafil (CIALIS) 5 MG tablet Take 5 mg by mouth as directed.     No facility-administered medications prior to visit.      Allergies:   Review of patient's allergies indicates no known allergies.   Social History   Social History  . Marital status:  Married    Spouse name: N/A  . Number of children: N/A  . Years of education: N/A   Social History Main Topics  . Smoking status: Former Smoker    Packs/day: 1.00    Years: 8.00    Types: Cigarettes    Quit date: 10/22/1967  . Smokeless tobacco: Never Used  . Alcohol use 3.6 oz/week    3 Glasses of wine, 3 Shots of liquor per week  . Drug use: No  . Sexual activity: Not Currently   Other Topics Concern  . None   Social History Narrative   Lives in [place]   Owns a Geophysicist/field seismologist business     Family History:  The patient's family history includes Arthritis in his father; Parkinson's disease in his mother.   ROS:   Please see the history of present illness.    ROS All other systems reviewed and are negative.   PHYSICAL EXAM:   VS:  BP 130/70   Pulse (!) 56   Ht 5\' 9"  (1.753 m)   Wt 167 lb 6.4  oz (75.9 kg)   SpO2 98%   BMI 24.72 kg/m    GEN: Well nourished, well developed, in no acute distress  HEENT: normal  Neck: no JVD, carotid bruits, or masses Cardiac: RRR; no murmurs, rubs, or gallops,no edema.  Intact distal pulses bilaterally.  Respiratory:  clear to auscultation bilaterally, normal work of breathing GI: soft, nontender, nondistended, + BS MS: no deformity or atrophy  Skin: warm and dry, no rash Neuro:  Alert and Oriented x 3, Strength and sensation are intact Psych: euthymic mood, full affect  Wt Readings from Last 3 Encounters:  04/29/16 167 lb 6.4 oz (75.9 kg)  02/12/16 164 lb 9.6 oz (74.7 kg)  11/12/15 164 lb (74.4 kg)      Studies/Labs Reviewed:   EKG:  EKG is not ordered today.   Recent Labs: 04/30/2015: TSH 2.553 05/01/2015: ALT 31 05/04/2015: BUN 19; Creatinine, Ser 0.98; Potassium 4.0; Sodium 138 05/05/2015: Hemoglobin 15.3; Platelets 233   Lipid Panel No results found for: CHOL, TRIG, HDL, CHOLHDL, VLDL, LDLCALC, LDLDIRECT  Additional studies/ records that were reviewed today include:  none    ASSESSMENT:    1. OSA (obstructive sleep apnea)      PLAN:  In order of problems listed above:  OSA - the patient is tolerating PAP therapy well without any problems. The PAP download was reviewed today and showed an AHI of 2.7/hr on Auto CPAP with 77%% compliance in using more than 4 hours nightly.  The patient has been using and benefiting from CPAP use and will continue to benefit from therapy. I will set his pressure at 9cm H2O and get a 2 week download.      Medication Adjustments/Labs and Tests Ordered: Current medicines are reviewed at length with the patient today.  Concerns regarding medicines are outlined above.  Medication changes, Labs and Tests ordered today are listed in the Patient Instructions below.  There are no Patient Instructions on file for this visit.   Signed, Fransico Him, MD  04/29/2016 9:27 AM    [address], [place], [place]  [id number] Phone: [phone]; Fax: [phone]

## 2016-05-10 ENCOUNTER — Encounter: Payer: Self-pay | Admitting: Cardiology

## 2016-07-09 ENCOUNTER — Telehealth: Payer: Self-pay | Admitting: Cardiology

## 2016-07-09 NOTE — Telephone Encounter (Signed)
New message  Pt wants to order online a mini-Cpap  Pt wants to know if an RX is needed to do that  Please call back and advise or email a PDF RX to his [email or web address]

## 2016-07-17 NOTE — Telephone Encounter (Signed)
She will need to check with the online place she is ordering from

## 2016-07-19 NOTE — Telephone Encounter (Signed)
Please order mini PAP with auto CPAP settings from 4 to 18cm H2O

## 2016-07-21 ENCOUNTER — Other Ambulatory Visit: Payer: Self-pay | Admitting: *Deleted

## 2016-07-21 ENCOUNTER — Ambulatory Visit: Payer: Self-pay

## 2016-07-21 ENCOUNTER — Encounter: Payer: Self-pay | Admitting: Sports Medicine

## 2016-07-21 ENCOUNTER — Ambulatory Visit (INDEPENDENT_AMBULATORY_CARE_PROVIDER_SITE_OTHER): Payer: 59 | Admitting: Sports Medicine

## 2016-07-21 VITALS — BP 125/67 | HR 53 | Ht 69.0 in | Wt 155.0 lb

## 2016-07-21 DIAGNOSIS — M25511 Pain in right shoulder: Secondary | ICD-10-CM | POA: Insufficient documentation

## 2016-07-21 DIAGNOSIS — G4733 Obstructive sleep apnea (adult) (pediatric): Secondary | ICD-10-CM

## 2016-07-21 NOTE — Assessment & Plan Note (Signed)
He has findings that are suggestive of ongoing changes within his supraspinatus. His BT findings may be residual from his history of PMR. He is strong on exam and there are no findings to suggest a tear.  - provided HEP for Rotator cuff and scapular stabilization - he is to f/u in 6 weeks. May need to consider checking his sed rate if there is minimal improvement.

## 2016-07-21 NOTE — Progress Notes (Signed)
  Juan Shaffer - 75 y.o. male MRN [id number]  Date of birth: [birth year]  SUBJECTIVE:  Including CC & ROS.   Juan Shaffer is a 75 yo M that is presenting with right shoulder pain. This pain has been present for about two years. He has pain on the lateral aspect of his right shoulder. The pain has become more constant in nature. He has pain with swimming in the recovery stroke and certain exercises that put his shoulder in abduction. He denies any inciting event. Denies any prior surgery. He has not taken any medications or tried any PT. He denies any symptoms of radiculopathy. He has a history of PMR and was on daily prednisone use from June 2016 to June 2017.    ROS: No unexpected weight loss, fever, chills, instability, numbness/tingling, redness, otherwise see HPI    HISTORY: Past Medical, Surgical, Social, and Family History Reviewed & Updated per EMR.   Pertinent Historical Findings include: PMSHx -  Prostate cancer, PMR, prostate removal, tonsillectomy, AFib PSHx -  Occasional cigar use, occasional alcohol use,  FHx -  RA Medications - xarelto, atenolol   DATA REVIEWED: None to review   PHYSICAL EXAM:  VS: BP:125/67  HR:(!) 53bpm  TEMP: ( )  RESP:   HT:5\' 9"  (175.3 cm)   WT:155 lb (70.3 kg)  BMI:22.9 PHYSICAL EXAM: Gen: NAD, alert, cooperative with exam, well-appearing HEENT: clear conjunctiva, EOMI CV:  no edema, capillary refill brisk,  Resp: non-labored, normal speech Skin: no rashes, normal turgor  Neuro: no gross deficits.  Psych:  alert and oriented Right shoulder:  No TTP of the BT or AC joint  Some TTP of the lateral shoulder over the supraspinatus Normal flexion and abduction actively  Normal strength to resistance with IR and ER  Normal external rotation  Normal Empty Can test  Some pain with Hawkin's test  Normal grip strength  Neurovascularly intact   Limited US: Right shoulder: There is a target sign when viewing the BT in short axis. There is  hypoechoic fluid encircling the BT when viewed in long axis but the tendon itself is intact. Subscapularis has changes that would suggest tendinopathy but no tears. There is no impingement when testing dynamically. The supraspinatus appears to have calcific changes within in the tendon but no tearing. There is no impingement when testing dynamically. The infraspinatus and TM were viewed to be normal. There is no impingement of the posterior glenoid. The [facility] joint has some degenerative changes.   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   ASSESSMENT & PLAN:   Acute pain of right shoulder He has findings that are suggestive of ongoing changes within his supraspinatus. His BT findings may be residual from his history of PMR. He is strong on exam and there are no findings to suggest a tear.  - provided HEP for Rotator cuff and scapular stabilization - he is to f/u in 6 weeks. May need to consider checking his sed rate if there is minimal improvement.

## 2016-08-03 ENCOUNTER — Telehealth: Payer: Self-pay | Admitting: *Deleted

## 2016-08-03 NOTE — Telephone Encounter (Signed)
Patient called to inquire about his mini cpap machine order that was faxed on 07/21/2016. He never received the order so I sent the order in the mail today at his request

## 2016-08-11 ENCOUNTER — Telehealth: Payer: Self-pay | Admitting: *Deleted

## 2016-08-11 NOTE — Telephone Encounter (Signed)
Followed up with the patient to make sure he got his mini cpap order in the mail and he said he did receive his order

## 2016-08-11 NOTE — Telephone Encounter (Signed)
-----   Message from [place] sent at 07/26/2016 10:28 AM EST ----- Regarding: RE: CPAP Got it! Thank you! ----- Message ----- From: Freada Bergeron, CMA Sent: 07/21/2016   3:18 PM To: Melissa Stenson Subject: RE: CPAP                                       Order in epic.  Thanks, Gae Bon ----- Message ----- From: Darlina Guys Sent: 07/21/2016  12:20 PM To: Freada Bergeron, CMA Subject: RE: CPAP                                       I don't see the order for this under "other orders."  Should I be looking somewhere else? ----- Message ----- From: Freada Bergeron, CMA Sent: 07/21/2016   9:51 AM To: Melissa Stenson Subject: CPAP                                           Please order mini PAP with auto CPAP settings from 4 to 18cm H2O

## 2016-08-25 ENCOUNTER — Ambulatory Visit (INDEPENDENT_AMBULATORY_CARE_PROVIDER_SITE_OTHER): Payer: 59 | Admitting: Podiatry

## 2016-08-25 ENCOUNTER — Encounter: Payer: Self-pay | Admitting: Podiatry

## 2016-08-25 VITALS — BP 107/78 | HR 109

## 2016-08-25 DIAGNOSIS — L851 Acquired keratosis [keratoderma] palmaris et plantaris: Secondary | ICD-10-CM

## 2016-08-25 DIAGNOSIS — L84 Corns and callosities: Secondary | ICD-10-CM | POA: Diagnosis not present

## 2016-08-25 DIAGNOSIS — M79671 Pain in right foot: Secondary | ICD-10-CM

## 2016-08-25 DIAGNOSIS — M79672 Pain in left foot: Secondary | ICD-10-CM

## 2016-08-26 ENCOUNTER — Telehealth: Payer: Self-pay | Admitting: Internal Medicine

## 2016-08-26 NOTE — Telephone Encounter (Signed)
New Message  Pt c/o medication issue:  1. Name of Medication: rivaroxaban (Xarelto) 20 mg tablet once daily  2. How are you currently taking this medication (dosage and times per day)? See above  3. Are you having a reaction (difficulty breathing--STAT)? N/A  4. What is your medication issue? Pt voiced he has some concerns regarding this medication with his heart rate being lower than normal and with his hands and feet staying cold.  Please f/u with pt

## 2016-08-26 NOTE — Telephone Encounter (Signed)
Called, spoke with pt. Pt stated his hands and feet have been cold since late November/early December. Pt thinks symptoms could possibly be r/t to taking Xarelto and Atenolol. Pt Denies CP or SOB. Pt stated he does not take his BP or HR daily. Pt states BP usually around 127/70 and Hr 54-56. Advised pt to obtain vital every day and keep a log. Will forward to [name] to advise.

## 2016-08-27 NOTE — Telephone Encounter (Signed)
I only follow him for OSA

## 2016-08-31 NOTE — Telephone Encounter (Signed)
May try switching to Eliquis 5 mg bid. GT

## 2016-09-01 ENCOUNTER — Ambulatory Visit (INDEPENDENT_AMBULATORY_CARE_PROVIDER_SITE_OTHER): Payer: 59 | Admitting: Sports Medicine

## 2016-09-01 ENCOUNTER — Encounter: Payer: Self-pay | Admitting: Sports Medicine

## 2016-09-01 DIAGNOSIS — M25511 Pain in right shoulder: Secondary | ICD-10-CM

## 2016-09-01 MED ORDER — APIXABAN 5 MG PO TABS: 5.0000 mg | ORAL_TABLET | Freq: Two times a day (BID) | ORAL | 2 refills | Status: DC

## 2016-09-01 MED ORDER — NITROGLYCERIN 0.2 MG/HR TD PT24: MEDICATED_PATCH | TRANSDERMAL | 1 refills | Status: DC

## 2016-09-01 NOTE — Assessment & Plan Note (Signed)
He has had some improvement of his pain. His function and overall strength are good. - Initiate nitroglycerin protocol. - He will continue his home exercise program. - He'll follow-up in 6-8 weeks to monitor for improvement.

## 2016-09-01 NOTE — Telephone Encounter (Signed)
Called, spoke with pt. Informed [name] stated pt may try switching to Eliquis 5 mg twice daily. Informed pt I have 2 weeks of samples for pt and discount card for $10 co-pay with insurance. Pt informed he will stop by Friday to pick up. Pt's last dose of Xarelto on Friday (09/03/16). 1st dose of Eliquis will be Saturday morning (09/04/16). Informed I will be on vacation, but I will leave the samples for him. Informed pt is past due for 6 month f/u ov. Pt declined to make appt on the phone with me. Informed pt to make appt at check out when he arrives to pick up samples. Pt verbalized understanding and agreed with plan.

## 2016-09-01 NOTE — Progress Notes (Signed)
  Juan Shaffer - 76 y.o. male MRN [id number]  Date of birth: 1940-10-25  SUBJECTIVE:  Including CC & ROS.   Juan Shaffer is a 76 year old male that is following up for his right shoulder pain. He was found upon ultrasound that he had some supraspinatus changes last time. He reports that there is some improvement of his pain. He reports being 20-30% improved. He has not been swimming since last time he was seen. He has some pain when he puts his arm in abduction. He is currently not taking any medications. He has not tried any formal physical therapy. He has done the exercises intermittently. He would like to retry to stick to a more rigid home exercise program before having to try anything else.   ROS: No unexpected weight loss, fever, chills, swelling, instability,  numbness/tingling, redness, otherwise see HPI    HISTORY: Past Medical, Surgical, Social, and Family History Reviewed & Updated per EMR.   Pertinent Historical Findings include: PMSHx - Prostate cancer, PMR, prostate removal, tonsillectomy, AFib  Medications -Xarelto  DATA REVIEWED: Previous ultrasound   PHYSICAL EXAM:  VS: BP:128/60  HR: bpm  TEMP: ( )  RESP:   HT:5\' 9"  (175.3 cm)   WT:155 lb (70.3 kg)  BMI:22.9 PHYSICAL EXAM: Gen: NAD, alert, cooperative with exam, well-appearing HEENT: clear conjunctiva, EOMI CV:  no edema, capillary refill brisk,  Resp: non-labored, normal speech Skin: no rashes, normal turgor  Neuro: no gross deficits.  Psych:  alert and oriented Right Shoulder: Inspection reveals no abnormalities, atrophy or asymmetry. Palpation is normal with no tenderness over AC joint  ROM is full in all planes. Rotator cuff strength normal throughout. No signs of impingement with negative Hawkin's tests Some pain with empty can sign. No painful arc and no drop arm sign. Neurovascularly intact    ASSESSMENT & PLAN:   Acute pain of right shoulder He has had some improvement of his pain. His  function and overall strength are good. - Initiate nitroglycerin protocol. - He will continue his home exercise program. - He'll follow-up in 6-8 weeks to monitor for improvement.

## 2016-09-01 NOTE — Telephone Encounter (Signed)
New Message   Pt returning phone call. Would like call back.

## 2016-09-01 NOTE — Patient Instructions (Signed)

## 2016-09-01 NOTE — Telephone Encounter (Signed)
Called, left message for pt to call our office.

## 2016-09-05 NOTE — Progress Notes (Signed)
Subjective: Patient presents to the office today for chief complaint of painful callus lesions of the feet. Patient states that the pain is ongoing and is affecting their ability to ambulate without pain. Patient presents today for further treatment and evaluation.  Objective:  Physical Exam General: Alert and oriented x3 in no acute distress  Dermatology: Hyperkeratotic lesion present on the weightbearing surface of the fifth MPJ bilateral. Pain on palpation with a central nucleated core noted.  Skin is warm, dry and supple bilateral lower extremities. Negative for open lesions or macerations.  Vascular: Palpable pedal pulses bilaterally. No edema or erythema noted. Capillary refill within normal limits.  Neurological: Epicritic and protective threshold grossly intact bilaterally.   Musculoskeletal Exam: Pain on palpation at the keratotic lesion noted. Range of motion within normal limits bilateral. Muscle strength 5/5 in all groups bilateral.  Assessment: #1 painful porokeratosis sub-fifth MPJ bilateral   Plan of Care:  #1 Patient evaluated #2 Excisional debridement of  keratoic lesion using a chisel blade was performed without incident.  #3 Treated area(s) with Salinocaine and dressed with light dressing. #4 Patient is to return to the clinic PRN.   Edrick Kins, DPM [facility]  Dr. Edrick Kins, [address]                                        [place], [place] [address]                Office [phone]  Fax [phone]

## 2016-09-24 ENCOUNTER — Encounter: Payer: Self-pay | Admitting: Internal Medicine

## 2016-09-24 ENCOUNTER — Ambulatory Visit (INDEPENDENT_AMBULATORY_CARE_PROVIDER_SITE_OTHER): Payer: 59 | Admitting: Internal Medicine

## 2016-09-24 VITALS — BP 122/60 | HR 61 | Ht 69.0 in | Wt 161.8 lb

## 2016-09-24 DIAGNOSIS — I480 Paroxysmal atrial fibrillation: Secondary | ICD-10-CM | POA: Diagnosis not present

## 2016-09-24 NOTE — Patient Instructions (Addendum)

## 2016-09-24 NOTE — Progress Notes (Signed)
HPI Mr. Juan Shaffer returns today for followup. He is a pleasant 76 yo man with atrial flutter, who underwent catheter ablation in the past. His repeat echo demonstrated normalization of his LV function. He has a h/o atrial fib and has been placed on atenolol and Eliquis. He has not had any symptomatic atrial fib. No other complaints. He is traveling to [place] in a couple of weeks. He feels like his heart beats irriegularly a couple of times a week.  No Known Allergies   Current Outpatient Prescriptions  Medication Sig Dispense Refill  . apixaban (ELIQUIS) 5 MG TABS tablet Take 1 tablet (5 mg total) by mouth 2 (two) times daily. 60 tablet 2  . atenolol (TENORMIN) 50 MG tablet Take 1 tablet (50 mg total) by mouth 2 (two) times daily. 180 tablet 3  . atorvastatin (LIPITOR) 20 MG tablet Take 20 mg by mouth at bedtime.  3  . Cholecalciferol (VITAMIN D-3 PO) Take 1 tablet by mouth daily.    Marland Kitchen CIALIS 20 MG tablet TAKE 1 TABLET BY MOUTH EVERY DAY AS NEEDED FOR UP TO 30 DAYS FOR ERECTILE DYSFUNCTION  0  . glucosamine-chondroitin 500-400 MG tablet Take 1 tablet by mouth daily.    . Multiple Vitamins-Minerals (PRESERVISION AREDS 2 PO) Take 1 capsule by mouth 2 (two) times daily.    . nitroGLYCERIN (NITRODUR - DOSED IN MG/24 HR) 0.2 mg/hr patch Use 1/4 patch to the affected area 30 patch 1  . omega-3 acid ethyl esters (LOVAZA) 1 G capsule Take 1 g by mouth daily.    . Vitamins/Minerals TABS Take 1 capsule by mouth 2 (two) times daily.     No current facility-administered medications for this visit.      Past Medical History:  Diagnosis Date  . Atrial flutter with rapid ventricular response ([place]) 04/30/2015   chads2vasc score is 2  . High cholesterol   . OSA (obstructive sleep apnea)    mild with AHI 12/hr  . OSA (obstructive sleep apnea)    mild with AHI 12/hr   . Polymyalgia rheumatica (HCC)    followed by Dr Charlestine Night  . Prostate cancer ([place])   . 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   Past Surgical History:  Procedure Laterality Date  . ELECTROPHYSIOLOGIC STUDY N/A 05/05/2015   Procedure: A-Flutter Ablation;  Surgeon: Evans Lance, MD;  Location: [facility];  Service: Cardiovascular;  Laterality: N/A;  . PROSTATECTOMY  12/2014  . TONSILLECTOMY  ~ 1946     Family History  Problem Relation Age of Onset  . Parkinson's disease Mother   . Arthritis Father   . Stroke       Social History   Social History  . Marital status: Married    Spouse name: N/A  . Number of children: N/A  . Years of education: N/A   Occupational History  . Not on file.   Social History Main Topics  . Smoking status: Former Smoker    Packs/day: 1.00    Years: 8.00    Types: Cigarettes    Quit date: 10/22/1967  . Smokeless tobacco: Never Used  . Alcohol use 3.6 oz/week    3 Glasses of wine, 3 Shots of liquor per week  . Drug use: No  . Sexual activity: Not Currently   Other Topics Concern  . Not on  file   Social History Narrative   Lives in [place]   Owns a photographer business     BP 122/60   Pulse 61   Ht 5\' 9"  (1.753 m)   Wt 161 lb 12.8 oz (73.4 kg)   SpO2 97%   BMI 23.89 kg/m   Physical Exam:  Well appearing 76 yo man, NAD HEENT: Unremarkable Neck:  6 cm JVD, no thyromegally Lymphatics:  No adenopathy Back:  No CVA tenderness Lungs:  Clear with no wheezes HEART:  Regular rate rhythm, no murmurs, no rubs, no clicks Abd:  soft, positive bowel sounds, no organomegally, no rebound, no guarding Ext:  2 plus pulses, no edema, no cyanosis, no clubbing Skin:  No rashes no nodules Neuro:  CN II through XII intact, motor grossly intact  EKG - Sinus bradycardia at 53/min   Assess/Plan: 1. New onset atrial fib - he has maintained NSR. I have asked the patient to continue his atenolol and anti-coagulation. If he has more atrial fib, will consider AA drug  therapy. I have asked him to limit his ETOH consumption to one drink a day. 2. HTN - his blood pressure is controlled. No change in meds. 3. Dyslipidemia - he will continue his current meds. He is off of prednisone.  4. coags - he has had no bleeding or falls. He will continue Eliquis.  Mikle Bosworth.D.

## 2016-11-02 ENCOUNTER — Other Ambulatory Visit: Payer: Self-pay | Admitting: Internal Medicine

## 2016-11-25 DIAGNOSIS — M7631 Iliotibial band syndrome, right leg: Secondary | ICD-10-CM | POA: Insufficient documentation

## 2016-11-25 DIAGNOSIS — M7632 Iliotibial band syndrome, left leg: Secondary | ICD-10-CM

## 2016-12-09 ENCOUNTER — Encounter: Payer: Self-pay | Admitting: Sports Medicine

## 2016-12-09 ENCOUNTER — Ambulatory Visit (INDEPENDENT_AMBULATORY_CARE_PROVIDER_SITE_OTHER): Payer: 59 | Admitting: Sports Medicine

## 2016-12-09 ENCOUNTER — Other Ambulatory Visit: Payer: Self-pay | Admitting: Internal Medicine

## 2016-12-09 DIAGNOSIS — M25552 Pain in left hip: Secondary | ICD-10-CM

## 2016-12-09 DIAGNOSIS — M25559 Pain in unspecified hip: Secondary | ICD-10-CM | POA: Insufficient documentation

## 2016-12-09 DIAGNOSIS — M25551 Pain in right hip: Secondary | ICD-10-CM

## 2016-12-09 NOTE — Progress Notes (Signed)
[facility] Progress Note  Subjective:  Juan Shaffer is a 76 y.o. male with history of prostate cancer and PMR who presents for bilateral hip pain. Pain began about 2 months ago in the R hip after patient flew to [place] for a business trip. It hurts with walking mostly within hip. Initially pain extended down back of thigh but now is more along side of thigh. Left hip began hurting about 3-4 weeks ago with pain remaining in hip. Sometimes crossing his legs makes pain worse. He does follow with a rheumatologist whom he saw about 1 month ago; was told it may be an issue with his hip muscles and would likely benefit from PT instead of prednisone. Of note, patient completed a 1-year course of prednisone about 1 year ago and has not had a flare of PMR since (involved shoulders and hips). It is difficult for patient to say whether or not this hip pain feels similar or different to his PMR. He has been doing a lot of upper body exercises and had been working on hip abduction but stopped hip abduction exercises, as he was worried the pain could indicate he could make things worse. He takes 1-2 1000 mg tablets of tylenol daily but does not think it helps much.  ROS: No fevers, no SOB  No Known Allergies  Objective: Blood pressure (!) 143/60, height 5' 9" (1.753 m), weight 156 lb (70.8 kg). Body mass index is 23.04 kg/m. Constitutional: Very pleasant, older gentleman in NAD Pulmonary/Chest: No respiratory distress.  Musculoskeletal: Hips appear well-aligned when standing. No TTP over greater trochanters. Full ROM with flexion and extension of knees, internal and external rotation at hip. Has pain at right hip with hip abduction to about 45 degrees but can push through. Strength 4+/5 with hip abduction at right hip, 5/5 at L hip. Negative Trendelenburg bilaterally.  Neurological: No decreased sensation of LEs.  Skin: Skin is warm and dry. No rash noted. Psychiatric: Normal mood and  affect.  Vitals reviewed  Assessment/Plan: Hip pain - Suspect 2/2 weakness and tightness of hip abductors - Recommended hip abduction exercises and close follow-up - Continue tylenol prn - Advised patient to establish with new Rheumatologist once Dr. Truslow retires, as would benefit from blood work (ESR, CBC) if symptoms not improving over next couple of months, as could be another PMR flare  Follow-up in 3-4 weeks to reassess symptoms.  Hillary Fitzgerald, MD [facility], PGY-2   Patient seen and evaluated with the resident. I agree with the above plan of care. Patient will start hip abductor strengthening and pelvic stabilizer exercises and will follow-up with me in 3-4 weeks for reevaluation. He is in the process of transitioning to a new rheumatologist so I will defer rheumatologic workup to them at this time. However, if he still has not seen his new rheumatologist follow-up then I would consider getting a sedimentation rate to rule out returning PMR. 

## 2016-12-09 NOTE — Assessment & Plan Note (Signed)
-  Suspect 2/2 weakness and tightness of hip abductors - Recommended hip abduction exercises and close follow-up - Continue tylenol prn - Advised patient to establish with new Rheumatologist once Dr. Janeal Holmes, as would benefit from blood work (ESR, CBC) if symptoms not improving over next couple of months, as could be another PMR flare

## 2017-01-13 ENCOUNTER — Encounter: Payer: Self-pay | Admitting: Sports Medicine

## 2017-01-13 ENCOUNTER — Ambulatory Visit
Admission: RE | Admit: 2017-01-13 | Discharge: 2017-01-13 | Disposition: A | Discharge status: Home / Self Care | Payer: 59 | Source: Ambulatory Visit | Attending: Sports Medicine | Admitting: Sports Medicine

## 2017-01-13 ENCOUNTER — Other Ambulatory Visit: Payer: Self-pay

## 2017-01-13 ENCOUNTER — Other Ambulatory Visit: Payer: Self-pay | Admitting: Sports Medicine

## 2017-01-13 ENCOUNTER — Ambulatory Visit (INDEPENDENT_AMBULATORY_CARE_PROVIDER_SITE_OTHER): Payer: 59 | Admitting: Sports Medicine

## 2017-01-13 VITALS — BP 128/66 | Ht 69.0 in | Wt 156.0 lb

## 2017-01-13 DIAGNOSIS — M25551 Pain in right hip: Secondary | ICD-10-CM

## 2017-01-13 DIAGNOSIS — M25552 Pain in left hip: Principal | ICD-10-CM

## 2017-01-13 MED ORDER — PREDNISONE 10 MG PO TABS: ORAL_TABLET | ORAL | 0 refills | Status: DC

## 2017-01-13 NOTE — Progress Notes (Signed)
Juan Shaffer - 76 y.o. male MRN [id number]  Date of birth: 26-Jan-1941   Subjective:  Juan Shaffer is a 76 y.o. male with history of prostate cancer and PMR who presents as a follow up for bilateral hip pain. Pain began in February in the right hip after patient flew to [place] for a business trip. Pain initially started on the right side then moved to the left side. Before, he described his pain as an 8/10 and it is now mostly a 2/10 since his last visit. It hurts "within hip" mostly with walking. He also describes pain extended down back of thigh on right side that is worse in the morning or when he first gets up from being inactive, but improves with walking.  He has been doing the lateral hip exercises and hip cross-overs and does not have pain with these exercises and feels like they may be improving his symptoms slightly.   He does follow with a rheumatologist, who thought that his pain could be muscular or from IT band, thought he would benefit from PT. He is seeing a new rheumatologist in mid-June (his old one just retired). He completed a 1-year course of prednisone about 1.5 years ago and has not had a flare of PMR since (involved shoulders and hips). He reports that this pain is similar to PMR but not as diffuse (before it involved both of his shoulders). His right shoulder is still slightly sore but not as bad as before.  He takes 1-2 1000 mg tablets of tylenol for pain at times but does not think it helps much.  ROS: No fevers, no SOB, no joint swelling, hand or wrist pain, no recent trauma  No Known Allergies  Objective: Vitals: Blood pressure 128/66, height '5\' 9"'$  (1.753 m), weight 156 lb (70.8 kg).  Constitutional: Very pleasant, older gentleman in NAD Pulmonary/Chest: No respiratory distress.  Musculoskeletal: Hips appear well-aligned when standing. No TTP over greater trochanters. Full ROM with flexion and extension of knees, internal and external rotation at hip. Strength  5/5 with bilateral hip abduction, adduction, flexion, extension. Negative Trendelenburg bilaterally.  Vague lateral hip pain with internal rotation on left side. Lateral hip pain with walking when left leg is extended behind him, no change in gait Neurological: No decreased sensation of LEs.  Skin: Skin is warm and dry. No rash noted. Psychiatric: Normal mood and affect.  Vitals reviewed  Assessment/Plan: [name] is a 76 yo male with history of PMR presenting with bilateral hip pain, left worse than right. He reports that the pain has mildly improved with hip strengthening exercises. Initially suspected that pain was secondary to weak, tight hip abductors but etiology of hip pain at this pain could possibly be rheumatologic, and could very likely be another flare of PMR. Will also obtain imaging to rule out osteoarthritis. Must consider claudication given age, risk factors for ACS, although this is less likely given description of symptoms.   Plan: - Obtain DG Pelvis 2 view - ESR to evaluate for PMR - Trial Prednisone dose pack - Continue hip abductor strengthening and pelvic stabilizer exercises  - Will call next week to reassess symptoms after prednisone pack, review imaging - Has appointment with rheumatology in 3 weeks  Sherilyn Banker, MD [facility], PGY-2  Patient seen and evaluated with the resident. I agree with the above plan of care. 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 I want to check a sedimentation rate given his previous history of PMR. I will also place him on a 6 day Sterapred Dosepak. I will follow-up with him via telephone early next week and we will discuss the results of his x-ray and his blood work at that time. We will also see how he has responded to the prednisone. He will keep his appointment with rheumatology in 3 weeks and will continue with his hip  abductor strengthening and pelvic stabilizer exercises.

## 2017-01-14 LAB — SPECIMEN STATUS REPORT

## 2017-01-14 LAB — SEDIMENTATION RATE: SED RATE: 2 mm/h (ref 0–30)

## 2017-01-18 ENCOUNTER — Telehealth: Payer: Self-pay | Admitting: Sports Medicine

## 2017-01-18 NOTE — Telephone Encounter (Signed)
  I spoke with the patient on the phone today after reviewing recent blood work and x-ray of his pelvis. His sedimentation rate is 2 which makes PMR highly unlikely. The x-rays of his hips show mild degenerative changes bilaterally but he also has degenerative changes in his lower lumbar spine. He is feeling much better as he tapers down from his 6 day Sterapred Dosepak. I've asked him to continue with his hip strengthening exercises and he will see rheumatology as scheduled in a couple of weeks. I've explained to the patient that his pain is likely originating from his lumbar spine and that we can evaluate this further if his pain once again worsens as he weans off of his Dosepak. He will follow-up with me again in 2-3 weeks for reevaluation and will call me with questions or concerns in the interim.

## 2017-04-20 ENCOUNTER — Encounter: Payer: Self-pay | Admitting: *Deleted

## 2017-04-20 ENCOUNTER — Other Ambulatory Visit: Payer: Self-pay | Admitting: *Deleted

## 2017-05-09 NOTE — Progress Notes (Signed)
Cardiology Office Note:    Date:  05/11/2017   ID:  Juan Shaffer, DOB 1940-10-18, MRN [id number]  PCP:  Velna Ochs, MD  Cardiologist:  Fransico Him, MD   Referring MD: Velna Ochs, MD   Chief Complaint  Patient presents with  . Sleep Apnea    History of Present Illness:    Juan Shaffer is a 76 y.o. male with a hx of atrial flutter, mild LV dysfunction secondary to tachycardia and OSA on CPAP.  He was initially referred for sleep evaluation due to snoring, witnessed apnea and excessive daytime fatigue.  His PSG showed mild OSA with an AHI of 12.6/hr and oxygen desaturations as low as 85%.  He underwent CPAP titration and is now on CPAP at 9cm H2O.    He is doing well with his CPAP device and thinks that he has gotten used to it.  He tolerates the full mask and feels the pressure is adequate.  Since going on CPAP he feels rested in the am and has no significant daytime sleepiness.  He denies any significant mouth or nasal dryness or nasal congestion.  He does not think that he snores.     Past Medical History:  Diagnosis Date  . Atrial flutter with rapid ventricular response ([place]) 04/30/2015   chads2vasc score is 2  . 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 High cholesterol   . OSA (obstructive sleep apnea)    mild with AHI 12/hr   . Polymyalgia rheumatica (HCC)    followed by Dr Charlestine Night  . Prostate cancer [facility])     Past Surgical History:  Procedure Laterality Date  . ELECTROPHYSIOLOGIC STUDY N/A 05/05/2015   Procedure: A-Flutter Ablation;  Surgeon: Evans Lance, MD;  Location: [facility];  Service: Cardiovascular;  Laterality: N/A;  . PROSTATECTOMY  12/2014  . TONSILLECTOMY  ~ 1946    Current Medications: Current Meds  Medication Sig  . atenolol (TENORMIN) 50 MG tablet TAKE 1 TABLET BY MOUTH TWICE A DAY  . atorvastatin (LIPITOR) 20 MG tablet TAKE 1 TABLET AT BEDTIME  . Cholecalciferol (VITAMIN D3) 1000 units  CAPS Take by mouth.  . clobetasol cream (TEMOVATE) 0.05 % APPLY ON SKIN TWICE A DAY *15 G INS MAX*  . DOCOSAHEXAENOIC ACID PO Take 1 g by mouth.  Arne Cleveland 5 MG TABS tablet TAKE 1 TABLET TWICE A DAY  . glucosamine-chondroitin 500-400 MG tablet Take 1 tablet by mouth daily.  . Melatonin 10 MG CAPS Take 10 mg by mouth.  . Multiple Vitamins-Minerals (PRESERVISION AREDS 2 PO) Take 1 capsule by mouth 2 (two) times daily.  Marland Kitchen omega-3 acid ethyl esters (LOVAZA) 1 G capsule Take 1 g by mouth daily.  . tadalafil (CIALIS) 20 MG tablet TAKE 1 TABLET BY MOUTH EVERY DAY AS NEEDED FOR UP TO 30 DAYS FOR ERECTILE DYSFUNCTION  . Vitamins/Minerals TABS Take 1 capsule by mouth 2 (two) times daily.     Allergies:   Patient has no known allergies.   Social History   Social History  . Marital status: Married    Spouse name: N/A  . Number of children: N/A  . Years of education: N/A   Social History Main Topics  . Smoking status: Former Smoker    Packs/day: 1.00    Years: 8.00    Types: Cigarettes    Quit date: 10/22/1967  . Smokeless tobacco: Never Used  . Alcohol use 3.6 oz/week  3 Glasses of wine, 3 Shots of liquor per week  . Drug use: No  . Sexual activity: Not Currently   Other Topics Concern  . None   Social History Narrative   Lives in [place]   Owns a Geophysicist/field seismologist business     Family History: The patient's family history includes Arthritis in his father; Parkinson's disease in his mother; Stroke in his unknown relative.  ROS:   Please see the history of present illness.     All other systems reviewed and are negative.  EKGs/Labs/Other Studies Reviewed:    The following studies were reviewed today: CPAP downlaod  EKG:  EKG is not ordered today.    Recent Labs: No results found for requested labs within last 8760 hours.   Recent Lipid Panel No results found for: CHOL, TRIG, HDL, CHOLHDL, VLDL, LDLCALC, LDLDIRECT  Physical Exam:    VS:  BP 120/60   Pulse 60   Ht 5'  9" (1.753 m)   Wt 161 lb 6.4 oz (73.2 kg)   SpO2 97%   BMI 23.83 kg/m     Wt Readings from Last 3 Encounters:  05/10/17 161 lb 6.4 oz (73.2 kg)  01/13/17 156 lb (70.8 kg)  12/09/16 156 lb (70.8 kg)     GEN:  Well nourished, well developed in no acute distress HEENT: Normal NECK: No JVD; No carotid bruits LYMPHATICS: No lymphadenopathy CARDIAC: RRR, no murmurs, rubs, gallops RESPIRATORY:  Clear to auscultation without rales, wheezing or rhonchi  ABDOMEN: Soft, non-tender, non-distended MUSCULOSKELETAL:  No edema; No deformity  SKIN: Warm and dry NEUROLOGIC:  Alert and oriented x 3 PSYCHIATRIC:  Normal affect   ASSESSMENT:    1. OSA (obstructive sleep apnea)    PLAN:    In order of problems listed above:  OSA - the patient is tolerating PAP therapy well without any problems. The PAP download was reviewed today and showed an AHI of 2.4/hr on 9 cm H2O with 77% compliance in using more than 4 hours nightly.  The patient has been using and benefiting from CPAP use and will continue to benefit from therapy.     Medication Adjustments/Labs and Tests Ordered: Current medicines are reviewed at length with the patient today.  Concerns regarding medicines are outlined above.  Orders Placed This Encounter  Procedures  . For home use only DME continuous positive airway pressure (CPAP)   No orders of the defined types were placed in this encounter.   Signed, Fransico Him, MD  05/11/2017 9:26 AM    [place]

## 2017-05-10 ENCOUNTER — Encounter: Payer: Self-pay | Admitting: Cardiology

## 2017-05-10 ENCOUNTER — Ambulatory Visit (INDEPENDENT_AMBULATORY_CARE_PROVIDER_SITE_OTHER): Payer: 59 | Admitting: Cardiology

## 2017-05-10 VITALS — BP 120/60 | HR 60 | Ht 69.0 in | Wt 161.4 lb

## 2017-05-10 DIAGNOSIS — G4733 Obstructive sleep apnea (adult) (pediatric): Secondary | ICD-10-CM | POA: Diagnosis not present

## 2017-05-10 NOTE — Patient Instructions (Signed)
Continue same medications.   Your physician wants you to follow-up in: 1 year.  You will receive a reminder letter in the mail two months in advance. If you don't receive a letter, please call our office to schedule the follow-up appointment.  

## 2017-05-25 ENCOUNTER — Other Ambulatory Visit: Payer: Self-pay | Admitting: Internal Medicine

## 2017-06-09 DIAGNOSIS — I4891 Unspecified atrial fibrillation: Secondary | ICD-10-CM | POA: Insufficient documentation

## 2017-06-24 ENCOUNTER — Other Ambulatory Visit: Payer: Self-pay | Admitting: Internal Medicine

## 2018-01-30 ENCOUNTER — Other Ambulatory Visit: Payer: Self-pay | Admitting: Internal Medicine

## 2018-01-31 ENCOUNTER — Telehealth: Payer: Self-pay | Admitting: Cardiology

## 2018-01-31 NOTE — Telephone Encounter (Signed)
Returned call to Pt.   Discussed with Pt that last visit with [name] 09/2016 Pt stated he had irregular heart beat 2-3 times a week.   Pt does not recall telling [name] this.  Advised Pt overdue to see [name].  Per Pt he thought [name] and Dr. Radford Pax were interchangeable visits. Advised he sees [name] for his heart rhythms and Dr. Radford Pax for sleep only.  Pt indicates understanding. Will send to scheduling to make overdue yearly appt.

## 2018-01-31 NOTE — Telephone Encounter (Signed)
New Message   STAT if HR is under 50 or over 120 (normal HR is 60-100 beats per minute)  1) What is your heart rate? 68  2) Do you have a log of your heart rate readings (document readings)?   3) Do you have any other symptoms? Patient says to him his heart rate seems irregular

## 2018-02-17 ENCOUNTER — Encounter

## 2018-02-17 ENCOUNTER — Ambulatory Visit (INDEPENDENT_AMBULATORY_CARE_PROVIDER_SITE_OTHER): Payer: 59 | Admitting: Internal Medicine

## 2018-02-17 ENCOUNTER — Encounter: Payer: Self-pay | Admitting: Internal Medicine

## 2018-02-17 VITALS — BP 130/82 | HR 57 | Ht 69.0 in | Wt 163.6 lb

## 2018-02-17 DIAGNOSIS — I480 Paroxysmal atrial fibrillation: Secondary | ICD-10-CM

## 2018-02-17 MED ORDER — APIXABAN 5 MG PO TABS: 5.0000 mg | ORAL_TABLET | Freq: Two times a day (BID) | ORAL | 3 refills | Status: DC

## 2018-02-17 MED ORDER — ATENOLOL 50 MG PO TABS: 50.0000 mg | ORAL_TABLET | Freq: Two times a day (BID) | ORAL | 3 refills | Status: DC

## 2018-02-17 MED ORDER — ATORVASTATIN CALCIUM 20 MG PO TABS: 20.0000 mg | ORAL_TABLET | Freq: Every day | ORAL | 3 refills | Status: DC

## 2018-02-17 NOTE — Patient Instructions (Signed)

## 2018-02-17 NOTE — Progress Notes (Addendum)
HPI Juan Shaffer returns today for followup of palpitations, s/p atrial flutter ablation. He has had rare episodes of atrial fib and was placed on beta blocker therapy. In the interim the patient has no symptomatic atrial fib. He has rare non-sustained palpitations. No edema and no sob. No Known Allergies   Current Outpatient Medications  Medication Sig Dispense Refill  . apixaban (ELIQUIS) 5 MG TABS tablet Take 1 tablet (5 mg total) by mouth 2 (two) times daily. 180 tablet 3  . atenolol (TENORMIN) 50 MG tablet Take 1 tablet (50 mg total) by mouth 2 (two) times daily. 180 tablet 3  . atorvastatin (LIPITOR) 20 MG tablet Take 1 tablet (20 mg total) by mouth at bedtime. 90 tablet 3  . Cholecalciferol (VITAMIN D3) 1000 units CAPS Take by mouth.    . clobetasol cream (TEMOVATE) 0.05 % APPLY ON SKIN TWICE A DAY *15 G INS MAX*  1  . DOCOSAHEXAENOIC ACID PO Take 1 g by mouth.    Marland Kitchen glucosamine-chondroitin 500-400 MG tablet Take 1 tablet by mouth daily.    . Melatonin 10 MG CAPS Take 10 mg by mouth.    . Multiple Vitamins-Minerals (PRESERVISION AREDS 2 PO) Take 1 capsule by mouth 2 (two) times daily.    Marland Kitchen omega-3 acid ethyl esters (LOVAZA) 1 G capsule Take 1 g by mouth daily.    . tadalafil (CIALIS) 20 MG tablet TAKE 1 TABLET BY MOUTH EVERY DAY AS NEEDED FOR UP TO 30 DAYS FOR ERECTILE DYSFUNCTION    . Vitamins/Minerals TABS Take 1 capsule by mouth 2 (two) times daily.     No current facility-administered medications for this visit.      Past Medical History:  Diagnosis Date  . Atrial flutter with rapid ventricular response ([place]) 04/30/2015   chads2vasc score is 2  . 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 High cholesterol   . OSA (obstructive sleep apnea)    mild with AHI 12/hr   . Polymyalgia rheumatica (HCC)    followed by Dr Charlestine Night  . Prostate cancer ([name])     ROS:   All systems reviewed and negative except as noted in the HPI.   Past  Surgical History:  Procedure Laterality Date  . ELECTROPHYSIOLOGIC STUDY N/A 05/05/2015   Procedure: A-Flutter Ablation;  Surgeon: Evans Lance, MD;  Location: [facility] [facility];  Service: Cardiovascular;  Laterality: N/A;  . PROSTATECTOMY  12/2014  . TONSILLECTOMY  ~ 1946     Family History  Problem Relation Age of Onset  . Parkinson's disease Mother   . Arthritis Father   . Stroke Unknown      Social History   Socioeconomic History  . Marital status: Married    Spouse name: Not on file  . Number of children: Not on file  . Years of education: Not on file  . Highest education level: Not on file  Occupational History  . Not on file  Social Needs  . Financial resource strain: Not on file  . Food insecurity:    Worry: Not on file    Inability: Not on file  . Transportation needs:    Medical: Not on file    Non-medical: Not on file  Tobacco Use  . Smoking status: Former Smoker    Packs/day: 1.00    Years: 8.00    Pack years: 8.00    Types: Cigarettes    Last attempt to quit:  10/22/1967    Years since quitting: 50.3  . Smokeless tobacco: Never Used  Substance and Sexual Activity  . Alcohol use: Yes    Alcohol/week: 3.6 oz    Types: 3 Glasses of wine, 3 Shots of liquor per week  . Drug use: No  . Sexual activity: Not Currently  Lifestyle  . Physical activity:    Days per week: Not on file    Minutes per session: Not on file  . Stress: Not on file  Relationships  . Social connections:    Talks on phone: Not on file    Gets together: Not on file    Attends religious service: Not on file    Active member of club or organization: Not on file    Attends meetings of clubs or organizations: Not on file    Relationship status: Not on file  . Intimate partner violence:    Fear of current or ex partner: Not on file    Emotionally abused: Not on file    Physically abused: Not on file    Forced sexual activity: Not on file  Other Topics Concern  . Not on file    Social History Narrative   Lives in [place]   Owns a photographer business     BP 130/82   Pulse (!) 57   Ht 5\' 9"  (1.753 m)   Wt 163 lb 9.6 oz (74.2 kg)   SpO2 98%   BMI 24.16 kg/m   Physical Exam:  Well appearing NAD HEENT: Unremarkable Neck:  No JVD, no thyromegally Lymphatics:  No adenopathy Back:  No CVA tenderness Lungs:  Clear with no wheezes HEART:  Regular rate rhythm, no murmurs, no rubs, no clicks Abd:  soft, positive bowel sounds, no organomegally, no rebound, no guarding Ext:  2 plus pulses, no edema, no cyanosis, no clubbing Skin:  No rashes no nodules Neuro:  CN II through XII intact, motor grossly intact  EKG - sinus brady with LAE  Assess/Plan: 1. PAF - he is maintaining NSR and will continue low dose beta blocker therapy and systemic anti-coagulation. He will continue to avoid caffeine and ETOH. 2. HTN - his blood pressure is controlled. No change. 3. Dyslipidemia - he will continue his statin therapy.  Juan Shaffer.D.

## 2018-02-20 ENCOUNTER — Ambulatory Visit: Payer: 59 | Admitting: Internal Medicine

## 2019-01-02 ENCOUNTER — Encounter

## 2019-01-02 ENCOUNTER — Encounter: Payer: Self-pay | Admitting: Podiatry

## 2019-01-02 ENCOUNTER — Ambulatory Visit (INDEPENDENT_AMBULATORY_CARE_PROVIDER_SITE_OTHER): Payer: 59 | Admitting: Podiatry

## 2019-01-02 ENCOUNTER — Other Ambulatory Visit: Payer: Self-pay

## 2019-01-02 VITALS — Temp 97.5°F

## 2019-01-02 DIAGNOSIS — Q828 Other specified congenital malformations of skin: Secondary | ICD-10-CM | POA: Diagnosis not present

## 2019-01-02 DIAGNOSIS — D689 Coagulation defect, unspecified: Secondary | ICD-10-CM

## 2019-01-02 NOTE — Progress Notes (Signed)
Complaint:  Visit Type: Patient returns to my office for continued preventative foot care services. Complaint: Patient states"  He has devoped painful callus on the outside of his left foot. Patient has been taking eliquiss.. The patient presents for preventative foot care services. No changes to ROS  Podiatric Exam: Vascular: dorsalis pedis and posterior tibial pulses are palpable bilateral. Capillary return is immediate. Temperature gradient is WNL. Skin turgor WNL  Sensorium: Normal Semmes Weinstein monofilament test. Normal tactile sensation bilaterally. Nail Exam:Normaltropic nails with no infection or drainage. Ulcer Exam: There is no evidence of ulcer or pre-ulcerative changes or infection. Orthopedic Exam: Muscle tone and strength are WNL. No limitations in general ROM. No crepitus or effusions noted. Foot type and digits show no abnormalities. Bony prominences are unremarkable. Skin:  Porokeratosis  Sub 5 left foot.. No infection or ulcers  Diagnosis:  Porokeratosis sub 5 left foot.  Treatment & Plan Procedures and Treatment: Consent by patient was obtained for treatment procedures.   Debridement of porokeratosis  Left foot.. No ulceration, no infection noted.  Return Visit-Office Procedure: Patient instructed to return to the office for a follow up visit prn  for continued evaluation and treatment.    Gardiner Cisar DPM

## 2019-03-04 ENCOUNTER — Other Ambulatory Visit: Payer: Self-pay | Admitting: Internal Medicine

## 2019-03-05 ENCOUNTER — Other Ambulatory Visit: Payer: Self-pay | Admitting: *Deleted

## 2019-03-05 NOTE — Telephone Encounter (Signed)
Pt last saw Dr Lovena Le 02/17/18, overdue for yearly follow-up. Will send staff msg to Lorenda Hatchet to schedule pt for follow-up.  Last labs 09/16/17 Creat 0.92 in care everywhere, pt is overdue for labwork as well.  Placed note on recall appt pt needs CBC and BMP drawn f/u Eliquis.  Age 78, weight 74.2kg, based on previous labs and weight pt is on appropriate dosage of Eliquis 5mg  BID.  Will refill rx x 1 and await pt appt and labs. Refill done on existing refill request.

## 2019-03-05 NOTE — Telephone Encounter (Signed)
[facility] [phone] requesting refill Eliquis 5mg . Thank you

## 2019-03-15 ENCOUNTER — Telehealth: Payer: 59 | Admitting: Physician Assistant

## 2019-04-06 ENCOUNTER — Telehealth: Payer: Self-pay | Admitting: Internal Medicine

## 2019-04-06 NOTE — Telephone Encounter (Signed)
New message:    Patient calling concering some medication.

## 2019-04-06 NOTE — Telephone Encounter (Signed)
Pt called to report that he would like to back off of his meds to see if it helps improve his Fatigue.. he denies chest pain, sob, edema... he has occasional dizziness. He does not know his BP, HR but has a BP cuff at home. I asked him to keep track of his VS and to be sure he is drinking enough fluids over the weekend and to have his readings available for his visit with Dr. Radford Pax on Monday 04/09/19.

## 2019-04-08 NOTE — Progress Notes (Signed)
Virtual Visit via Video Note   This visit type was conducted due to national recommendations for restrictions regarding the COVID-19 Pandemic (e.g. social distancing) in an effort to limit this patient's exposure and mitigate transmission in our community.  Due to his co-morbid illnesses, this patient is at least at moderate risk for complications without adequate follow up.  This format is felt to be most appropriate for this patient at this time.  All issues noted in this document were discussed and addressed.  A limited physical exam was performed with this format.  Please refer to the patient's chart for his consent to telehealth for [facility].    Evaluation Performed:  Follow-up visit  This visit type was conducted due to national recommendations for restrictions regarding the COVID-19 Pandemic (e.g. social distancing).  This format is felt to be most appropriate for this patient at this time.  All issues noted in this document were discussed and addressed.  No physical exam was performed (except for noted visual exam findings with Video Visits).  Please refer to the patient's chart (MyChart message for video visits and phone note for telephone visits) for the patient's consent to telehealth for [facility].  Date:  04/09/2019   ID:  Juan Shaffer, DOB 05-04-1941, MRN [id number]  Patient Location:  Home  Provider location:   [place]  PCP:  Velna Ochs, MD  Sleep medicine:  Fransico Him, MD Electrophysiologist:  None   Chief Complaint:  OSA  History of Present Illness:    Juan Shaffer is a 78 y.o. male who presents via audio/video conferencing for a telehealth visit today.    Juan Shaffer is a 78 y.o. male with a hx of atrial flutter, mild LV dysfunction secondary to tachycardia and OSA on CPAP.  He was initially referred for sleep evaluation due to snoring, witnessed apnea and excessive daytime fatigue. His PSG showed mild OSA with an AHI of 12.6/hr and  oxygen desaturations as low as 85%. He underwent CPAP titration and is now on CPAP at 9cm H2O.   He is doing well with his CPAP device and thinks that he has gotten used to it.  He tolerates the mask and feels the pressure is adequate.  Since going on CPAP he feels rested in the am and has no significant daytime sleepiness.  He denies any significant mouth or nasal dryness or nasal congestion.  He does not think that he snores.     The patient does not have symptoms concerning for COVID-19 infection (fever, chills, cough, or new shortness of breath).    Prior CV studies:   The following studies were reviewed today:  PAP compliance download  Past Medical History:  Diagnosis Date  . Atrial flutter with rapid ventricular response ([place]) 04/30/2015   chads2vasc score is 2  . 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 High cholesterol   . OSA (obstructive sleep apnea)    mild with AHI 12/hr   . Polymyalgia rheumatica (HCC)    followed by Dr Charlestine Night  . Prostate cancer [facility])    Past Surgical History:  Procedure Laterality Date  . ELECTROPHYSIOLOGIC STUDY N/A 05/05/2015   Procedure: A-Flutter Ablation;  Surgeon: Evans Lance, MD;  Location: [place] [facility];  Service: Cardiovascular;  Laterality: N/A;  . PROSTATECTOMY  12/2014  . TONSILLECTOMY  ~ 1946     Current Meds  Medication Sig  . atenolol (TENORMIN) 50 MG tablet  Take 1 tablet (50 mg total) by mouth 2 (two) times daily. Please schedule an appt for further refills  . atorvastatin (LIPITOR) 20 MG tablet Take 1 tablet (20 mg total) by mouth at bedtime.  . Cholecalciferol (VITAMIN D3) 1000 units CAPS Take by mouth daily.   . clobetasol cream (TEMOVATE) 6.94 % Apply 1 application topically as directed.   Marland Kitchen ELIQUIS 5 MG TABS tablet TAKE 1 TABLET BY MOUTH TWICE A DAY  . glucosamine-chondroitin 500-400 MG tablet Take 1 tablet by mouth daily.  . Melatonin 10 MG CAPS Take 10 mg by mouth as needed  (sleep).   . Multiple Vitamin (MULTIVITAMIN) tablet Take by mouth.  . Multiple Vitamins-Minerals (PRESERVISION AREDS 2 PO) Take 1 capsule by mouth 2 (two) times daily.  . Omega-3 1000 MG CAPS Take by mouth.  . tadalafil (CIALIS) 20 MG tablet TAKE 1 TABLET BY MOUTH EVERY DAY AS NEEDED FOR UP TO 30 DAYS FOR ERECTILE DYSFUNCTION     Allergies:   Patient has no known allergies.   Social History   Tobacco Use  . Smoking status: Former Smoker    Packs/day: 1.00    Years: 8.00    Pack years: 8.00    Types: Cigarettes    Quit date: 10/22/1967    Years since quitting: 51.4  . Smokeless tobacco: Never Used  Substance Use Topics  . Alcohol use: Yes    Alcohol/week: 6.0 standard drinks    Types: 3 Glasses of wine, 3 Shots of liquor per week  . Drug use: No     Family Hx: The patient's family history includes Arthritis in his father; Parkinson's disease in his mother; Stroke in his unknown relative.  ROS:   Please see the history of present illness.     All other systems reviewed and are negative.   Labs/Other Tests and Data Reviewed:    Recent Labs: No results found for requested labs within last 8760 hours.   Recent Lipid Panel No results found for: CHOL, TRIG, HDL, CHOLHDL, LDLCALC, LDLDIRECT  Wt Readings from Last 3 Encounters:  04/09/19 158 lb (71.7 kg)  02/17/18 163 lb 9.6 oz (74.2 kg)  05/10/17 161 lb 6.4 oz (73.2 kg)     Objective:    Vital Signs:  BP 133/73   Pulse 81   Ht 5\' 9"  (1.753 m)   Wt 158 lb (71.7 kg)   BMI 23.33 kg/m    CONSTITUTIONAL:  Well nourished, well developed male in no acute distress.  EYES: anicteric MOUTH: oral mucosa is pink RESPIRATORY: Normal respiratory effort, symmetric expansion CARDIOVASCULAR: No peripheral edema SKIN: No rash, lesions or ulcers MUSCULOSKELETAL: no digital cyanosis NEURO: Cranial Nerves II-XII grossly intact, moves all extremities PSYCH: Intact judgement and insight.  A&O x 3, Mood/affect  appropriate   ASSESSMENT & PLAN:    1.  OSA -  The patient is tolerating PAP therapy well without any problems. The PAP download was reviewed today and showed an AHI of 2.6/hr on 9 cm H2O with 97% compliance in using more than 4 hours nightly.  The patient has been using and benefiting from PAP use and will continue to benefit from therapy.   2.  Excessive daytime fatigue  - he thinks it related to his BB and I asked him to talk to [name] who prescribed it. His OSA is well treated.  He gets around 7 hours of sleep a night.    COVID-19 Education: The signs and symptoms of COVID-19 were  discussed with the patient and how to seek care for testing (follow up with PCP or arrange E-visit).  The importance of social distancing was discussed today.  Patient Risk:   After full review of this patient's clinical status, I feel that they are at least moderate risk at this time.  Time:   Today, I have spent 15 minutes on telemedicine discussing medical problems including OSA.  We also reviewed the symptoms of COVID 19 and the ways to protect against contracting the virus with telehealth technology.  I spent an additional 5 minutes reviewing patient's chart including PAP compliance data.  Medication Adjustments/Labs and Tests Ordered: Current medicines are reviewed at length with the patient today.  Concerns regarding medicines are outlined above.  Tests Ordered: No orders of the defined types were placed in this encounter.  Medication Changes: No orders of the defined types were placed in this encounter.   Disposition:  Follow up in 1 year(s)  Signed, Fransico Him, MD  04/09/2019 9:23 AM    [facility]

## 2019-04-09 ENCOUNTER — Telehealth (INDEPENDENT_AMBULATORY_CARE_PROVIDER_SITE_OTHER): Payer: 59 | Admitting: Cardiology

## 2019-04-09 ENCOUNTER — Other Ambulatory Visit: Payer: Self-pay

## 2019-04-09 VITALS — BP 133/73 | HR 81 | Ht 69.0 in | Wt 158.0 lb

## 2019-04-09 DIAGNOSIS — G4733 Obstructive sleep apnea (adult) (pediatric): Secondary | ICD-10-CM | POA: Diagnosis not present

## 2019-04-09 NOTE — Patient Instructions (Signed)
Medication Instructions:  Your physician recommends that you continue on your current medications as directed. Please refer to the Current Medication list given to you today.  If you need a refill on your cardiac medications before your next appointment, please call your pharmacy.   Lab work: none If you have labs (blood work) drawn today and your tests are completely normal, you will receive your results only by: Marland Kitchen MyChart Message (if you have MyChart) OR . A paper copy in the mail If you have any lab test that is abnormal or we need to change your treatment, we will call you to review the results.  Testing/Procedures: none  Follow-Up: At [facility], you and your health needs are our priority.  As part of our continuing mission to provide you with exceptional heart care, we have created designated Provider Care Teams.  These Care Teams include your primary Cardiologist (physician) and Advanced Practice Providers (APPs -  Physician Assistants and Nurse Practitioners) who all work together to provide you with the care you need, when you need it. You will need a follow up appointment in 12 months.  Please call our office 2 months in advance to schedule this appointment.  You may see Dr. Radford Pax or one of the following Advanced Practice Providers on your designated Care Team:   [place], PA-C Melina Copa, PA-C . Ermalinda Barrios, PA-C  Any Other Special Instructions Will Be Listed Below (If Applicable).

## 2019-04-10 ENCOUNTER — Telehealth: Payer: Self-pay | Admitting: Cardiology

## 2019-04-10 NOTE — Telephone Encounter (Signed)
   Primary Cardiologist: Cristopher Peru, MD  Chart reviewed as part of pre-operative protocol coverage. Patient was contacted 04/10/2019 in reference to pre-operative risk assessment for pending surgery as outlined below.  Juan Shaffer was last seen on 04/09/2019 by Dr. Radford Pax for OSA, with last visit with [name] being 01/2018.  Since that day, Juan Shaffer has done well from a cardiac standpoint. He continues to exercise daily and swims several days per week. No complaints of chest pain, SOB, or palpitations with these activities.   Therefore, based on ACC/AHA guidelines, the patient would be at acceptable risk for the planned procedure without further cardiovascular testing.   Per pharmacy recommendations, patient can hold eliquis for 2 days prior to his upcoming colonoscopy and should restart eliquis as soon as he is cleared to do so by his gastroenterologist.   He does mention feeling fatigued which he attributes to his BBlocker medication, for which he asked about an alternative option. He is scheduled to see [name] 04/19/2019, therefore will defer medication adjustments to [name].   I will route this recommendation to the requesting party via Epic fax function and remove from pre-op pool.  Please call with questions.  Abigail Butts, PA-C 04/10/2019, 1:58 PM

## 2019-04-10 NOTE — Telephone Encounter (Signed)
   [facility] Pre-operative Risk Assessment    Request for surgical clearance:  1. What type of surgery is being performed? Colonoscopy   2. When is this surgery scheduled? TBD   3. What type of clearance is required (medical clearance vs. Pharmacy clearance to hold med vs. Both)?  Both  4. Are there any medications that need to be held prior to surgery and how long? Eliquis 2 days prior   5. Practice name and name of physician performing surgery?  [facility]- Dr. Rolm Bookbinder   6. What is your office phone number? [phone]    7.   What is your office fax number? [phone]  8.   Anesthesia type (None, local, MAC, general) ? Not listed  _________________________________________________________________   (provider comments below)

## 2019-04-10 NOTE — Telephone Encounter (Signed)
   Left voicemail to call back to discuss preoperative assessment.   Abigail Butts, PA-C 04/10/19; 2:05 PM

## 2019-04-10 NOTE — Telephone Encounter (Signed)
Patient with diagnosis of afib on Eliquis for anticoagulation.    Procedure: Colonoscopy  Date of procedure: TBD  CHADS2-VASc score of  3 (CHF, AGE,  AGE, )  Per office protocol, patient can hold Eliquis for 2 days prior to procedure.

## 2019-04-19 ENCOUNTER — Ambulatory Visit (INDEPENDENT_AMBULATORY_CARE_PROVIDER_SITE_OTHER): Payer: 59 | Admitting: Internal Medicine

## 2019-04-19 ENCOUNTER — Other Ambulatory Visit: Payer: Self-pay

## 2019-04-19 ENCOUNTER — Encounter: Payer: Self-pay | Admitting: Internal Medicine

## 2019-04-19 VITALS — BP 140/88 | HR 89 | Ht 69.0 in | Wt 162.6 lb

## 2019-04-19 DIAGNOSIS — I480 Paroxysmal atrial fibrillation: Secondary | ICD-10-CM

## 2019-04-19 DIAGNOSIS — E7800 Pure hypercholesterolemia, unspecified: Secondary | ICD-10-CM

## 2019-04-19 DIAGNOSIS — I10 Essential (primary) hypertension: Secondary | ICD-10-CM | POA: Diagnosis not present

## 2019-04-19 LAB — CBC WITH DIFFERENTIAL/PLATELET
Basophils Absolute: 0.1 10*3/uL (ref 0.0–0.2)
Basos: 1 %
EOS (ABSOLUTE): 0.2 10*3/uL (ref 0.0–0.4)
Eos: 2 %
Hematocrit: 48.6 % (ref 37.5–51.0)
Hemoglobin: 16 g/dL (ref 13.0–17.7)
Immature Grans (Abs): 0 10*3/uL (ref 0.0–0.1)
Immature Granulocytes: 0 %
Lymphocytes Absolute: 1.5 10*3/uL (ref 0.7–3.1)
Lymphs: 20 %
MCH: 29.9 pg (ref 26.6–33.0)
MCHC: 32.9 g/dL (ref 31.5–35.7)
MCV: 91 fL (ref 79–97)
Monocytes Absolute: 0.9 10*3/uL (ref 0.1–0.9)
Monocytes: 12 %
Neutrophils Absolute: 5.1 10*3/uL (ref 1.4–7.0)
Neutrophils: 65 %
Platelets: 217 10*3/uL (ref 150–450)
RBC: 5.36 x10E6/uL (ref 4.14–5.80)
RDW: 12.4 % (ref 11.6–15.4)
WBC: 7.8 10*3/uL (ref 3.4–10.8)

## 2019-04-19 LAB — BASIC METABOLIC PANEL
BUN/Creatinine Ratio: 16 (ref 10–24)
BUN: 16 mg/dL (ref 8–27)
CO2: 23 mmol/L (ref 20–29)
Calcium: 9.8 mg/dL (ref 8.6–10.2)
Chloride: 101 mmol/L (ref 96–106)
Creatinine, Ser: 1.03 mg/dL (ref 0.76–1.27)
GFR calc Af Amer: 79 mL/min/{1.73_m2} (ref >59)
GFR calc non Af Amer: 69 mL/min/{1.73_m2} (ref >59)
Glucose: 108 mg/dL — ABNORMAL HIGH (ref 65–99)
Potassium: 4.5 mmol/L (ref 3.5–5.2)
Sodium: 140 mmol/L (ref 134–144)

## 2019-04-19 LAB — TSH: TSH: 3.01 u[IU]/mL (ref 0.450–4.500)

## 2019-04-19 NOTE — Progress Notes (Signed)
HPI Mr. Acocella returns today for followup of his atrial fib. He has a h/o atrial flutter and underwent catheter ablation. He developed PAF which has become more persistent although he thinks that he is still back in rhythm at times. He has fatigue and some weakness although he is able to swim and exercise. No chest pain or sob.  No Known Allergies   Current Outpatient Medications  Medication Sig Dispense Refill  . atenolol (TENORMIN) 50 MG tablet Take 1 tablet (50 mg total) by mouth 2 (two) times daily. Please schedule an appt for further refills 180 tablet 0  . atorvastatin (LIPITOR) 20 MG tablet Take 1 tablet (20 mg total) by mouth at bedtime. 90 tablet 3  . Cholecalciferol (VITAMIN D3) 1000 units CAPS Take by mouth daily.     . clobetasol cream (TEMOVATE) [id number] % Apply 1 application topically as directed.   1  . ELIQUIS 5 MG TABS tablet TAKE 1 TABLET BY MOUTH TWICE A DAY 180 tablet 0  . glucosamine-chondroitin 500-400 MG tablet Take 1 tablet by mouth daily.    . Melatonin 10 MG CAPS Take 10 mg by mouth as needed (sleep).     . Multiple Vitamin (MULTIVITAMIN) tablet Take by mouth.    . Multiple Vitamins-Minerals (PRESERVISION AREDS 2 PO) Take 1 capsule by mouth 2 (two) times daily.    . Omega-3 1000 MG CAPS Take by mouth.    . tadalafil (CIALIS) 20 MG tablet TAKE 1 TABLET BY MOUTH EVERY DAY AS NEEDED FOR UP TO 30 DAYS FOR ERECTILE DYSFUNCTION     No current facility-administered medications for this visit.      Past Medical History:  Diagnosis Date  . Atrial flutter with rapid ventricular response ([place]) 04/30/2015   chads2vasc score is 2  . 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 High cholesterol   . OSA (obstructive sleep apnea)    mild with AHI 12/hr   . Polymyalgia rheumatica (HCC)    followed by Dr Charlestine Night  . Prostate cancer ([place])     ROS:   All systems reviewed and negative except as noted in the HPI.   Past Surgical  History:  Procedure Laterality Date  . ELECTROPHYSIOLOGIC STUDY N/A 05/05/2015   Procedure: A-Flutter Ablation;  Surgeon: Evans Lance, MD;  Location: [place] [facility];  Service: Cardiovascular;  Laterality: N/A;  . PROSTATECTOMY  12/2014  . TONSILLECTOMY  ~ 1946     Family History  Problem Relation Age of Onset  . Parkinson's disease Mother   . Arthritis Father   . Stroke Unknown      Social History   Socioeconomic History  . Marital status: Married    Spouse name: Not on file  . Number of children: Not on file  . Years of education: Not on file  . Highest education level: Not on file  Occupational History  . Not on file  Social Needs  . Financial resource strain: Not on file  . Food insecurity    Worry: Not on file    Inability: Not on file  . Transportation needs    Medical: Not on file    Non-medical: Not on file  Tobacco Use  . Smoking status: Former Smoker    Packs/day: 1.00    Years: 8.00    Pack years: 8.00    Types: Cigarettes    Quit date: 10/22/1967    Years since quitting:  51.5  . Smokeless tobacco: Never Used  Substance and Sexual Activity  . Alcohol use: Yes    Alcohol/week: 6.0 standard drinks    Types: 3 Glasses of wine, 3 Shots of liquor per week  . Drug use: No  . Sexual activity: Not Currently  Lifestyle  . Physical activity    Days per week: Not on file    Minutes per session: Not on file  . Stress: Not on file  Relationships  . Social Herbalist on phone: Not on file    Gets together: Not on file    Attends religious service: Not on file    Active member of club or organization: Not on file    Attends meetings of clubs or organizations: Not on file    Relationship status: Not on file  . Intimate partner violence    Fear of current or ex partner: Not on file    Emotionally abused: Not on file    Physically abused: Not on file    Forced sexual activity: Not on file  Other Topics Concern  . Not on file  Social History  Narrative   Lives in [place]   Owns a photographer business     BP 140/88   Pulse 89   Ht 5\' 9"  (1.753 m)   Wt 162 lb 9.6 oz (73.8 kg)   SpO2 98%   BMI 24.01 kg/m   Physical Exam:  Well appearing NAD HEENT: Unremarkable Neck:  6 cm JVD, no thyromegally Lymphatics:  No adenopathy Back:  No CVA tenderness Lungs:  Clear with no wheezes HEART:  IRegular rate rhythm, no murmurs, no rubs, no clicks Abd:  soft, positive bowel sounds, no organomegally, no rebound, no guarding Ext:  2 plus pulses, no edema, no cyanosis, no clubbing Skin:  No rashes no nodules Neuro:  CN II through XII intact, motor grossly intact  EKG - atrial fib with a controlled VR  Assess/Plan: 1. Persistent atrial fib - the patient is minimally symptomatic. We discussed the treatment options in detail. He will call us if he would like to proceed with dofetilide initiation. I also discussed amiodarone.  2. Atrial flutter - no evidence of recurrence.  3. Coags - no bleeding on eliquis. I spent over 25 minutes with the patient including 50% face to face time  Mikle Bosworth.D.

## 2019-04-19 NOTE — Patient Instructions (Addendum)
Medication Instructions:  Your physician recommends that you continue on your current medications as directed. Please refer to the Current Medication list given to you today.  Labwork: You will get lab work today:  BMP, CBC and TSH  Testing/Procedures: None ordered.  Follow-Up: Your physician wants you to follow-up based on your decision regarding medications.  Any Other Special Instructions Will Be Listed Below (If Applicable).  If you need a refill on your cardiac medications before your next appointment, please call your pharmacy.   OPTIONS for treatment of Afib:  1.  Continue rate control-atenolol  -simple, won't feel much better  -safe 2.  Call us to come in for dofetilide  -3.5 days in the hospital  -a little more complicated  -[id number] going back/staying in rhythm  -more "doctoring"  -few side effects 3.  Start amiodarone as outpatient  -pro's- simple, outpatient  -con's- risk of side effects 4.  Ablation only if you fail #2 or #3

## 2019-05-12 ENCOUNTER — Other Ambulatory Visit: Payer: Self-pay | Admitting: Internal Medicine

## 2019-05-29 ENCOUNTER — Other Ambulatory Visit: Payer: Self-pay | Admitting: Internal Medicine

## 2019-05-29 NOTE — Telephone Encounter (Signed)
Age 78, weight 74kg, SCr 1.03 on 04/19/19, last OV August 2020, afib indication.

## 2019-09-04 ENCOUNTER — Telehealth: Payer: Self-pay | Admitting: Internal Medicine

## 2019-09-04 NOTE — Telephone Encounter (Signed)
  Patient would like to speak to Dr Lovena Le regarding the different procedures they discussed at his last visit. He would like to figure out what his best option is and get more information about the procedure.

## 2019-09-04 NOTE — Telephone Encounter (Signed)
Left message for Pt.  Advised his options had been reduced d/t virus.  Advised at this time he could start amiodarone.  We are not doing any admits at this time for dofetilide.  Advised to call office if he would like to schedule a f/u appt to rediscuss his options.  Gave scheduler number to call if he would like to schedule appt.  Gave main number if he would like to speak to this nurse to start amiodarone.

## 2019-09-04 NOTE — Telephone Encounter (Signed)
  We are recommending the COVID-19 vaccine to all of our patients. Cardiac medications (including blood thinners) should not deter anyone from being vaccinated and there is no need to hold any of those medications prior to vaccine administration.     Currently, there is a hotline to call (active 08/31/19) to schedule vaccination appointments as no walk-ins will be accepted.   Number: [phone]    If you have further questions or concerns about the vaccine process, please visit [email or web address] or contact your primary care physician.   Advised patient of the above information

## 2019-09-12 NOTE — Telephone Encounter (Signed)
Left 2nd detailed message for Pt reiterating last message.  Advised Pt to call this nurse if he has any questions.  Otherwise no further outreach will be made.  Advised to call if any needs.

## 2019-11-02 ENCOUNTER — Ambulatory Visit (INDEPENDENT_AMBULATORY_CARE_PROVIDER_SITE_OTHER): Payer: 59 | Admitting: Podiatry

## 2019-11-02 ENCOUNTER — Other Ambulatory Visit: Payer: Self-pay

## 2019-11-02 ENCOUNTER — Encounter: Payer: Self-pay | Admitting: Podiatry

## 2019-11-02 DIAGNOSIS — Q828 Other specified congenital malformations of skin: Secondary | ICD-10-CM

## 2019-11-02 DIAGNOSIS — D689 Coagulation defect, unspecified: Secondary | ICD-10-CM | POA: Diagnosis not present

## 2019-11-02 NOTE — Progress Notes (Signed)
Complaint:  Visit Type: Patient returns to my office for continued preventative foot care services. Complaint: Patient states"  He has devoped painful callus on the outside of his left foot. Patient has been taking eliquiss.. The patient presents for preventative foot care services. No changes to ROS  Podiatric Exam: Vascular: dorsalis pedis and posterior tibial pulses are palpable bilateral. Capillary return is immediate. Temperature gradient is WNL. Skin turgor WNL  Sensorium: Normal Semmes Weinstein monofilament test. Normal tactile sensation bilaterally. Nail Exam:Normaltropic nails with no infection or drainage. Ulcer Exam: There is no evidence of ulcer or pre-ulcerative changes or infection. Orthopedic Exam: Muscle tone and strength are WNL. No limitations in general ROM. No crepitus or effusions noted. Foot type and digits show no abnormalities. Bony prominences are unremarkable. Skin:  Porokeratosis  Sub 5 left foot.. No infection or ulcers  Diagnosis:  Porokeratosis sub 5 left foot.  Treatment & Plan Procedures and Treatment: Consent by patient was obtained for treatment procedures.   Debridement of porokeratosis  Left foot.. No ulceration, no infection noted.  Return Visit-Office Procedure: Patient instructed to return to the office for a follow up visit prn  for continued evaluation and treatment.    Gardiner Crisco DPM

## 2019-11-26 ENCOUNTER — Other Ambulatory Visit: Payer: Self-pay | Admitting: Internal Medicine

## 2019-11-26 NOTE — Telephone Encounter (Signed)
Eliquis 5mg  refill request received, pt is 79 yrs old, weight-73.8kg, Crea-1.03 on 04/19/2019, Diagnosis-Afib, and last seen by [name] on 04/19/2019. Dose is appropriate based on dosing criteria. Will send in refill to requested pharmacy.

## 2020-05-08 ENCOUNTER — Ambulatory Visit: Payer: 59 | Admitting: Cardiology

## 2020-05-13 ENCOUNTER — Ambulatory Visit: Payer: 59 | Admitting: Cardiology

## 2020-05-30 ENCOUNTER — Other Ambulatory Visit: Payer: Self-pay | Admitting: Internal Medicine

## 2020-06-02 ENCOUNTER — Other Ambulatory Visit: Payer: Self-pay | Admitting: Internal Medicine

## 2020-06-02 NOTE — Telephone Encounter (Signed)
Pt last saw Dr Lovena Le 04/19/19, pt is overdue for follow-up. Pt has a follow-up scheduled for 07/16/20 with Dr Radford Pax. Last labs 05/07/20 Creat 0.92 at [facility] per care everywhere, age 79, weight 73.8kg, based on specified criteria pt is on appropriate dosage of Eliquis 5mg  BID.  Will refill rx to get pt to upcoming appt.

## 2020-06-05 ENCOUNTER — Ambulatory Visit: Payer: 59 | Admitting: Cardiology

## 2020-07-02 ENCOUNTER — Other Ambulatory Visit: Payer: Self-pay | Admitting: Internal Medicine

## 2020-07-16 ENCOUNTER — Ambulatory Visit: Payer: 59 | Admitting: Cardiology

## 2020-07-30 ENCOUNTER — Other Ambulatory Visit: Payer: Self-pay | Admitting: Internal Medicine

## 2020-08-04 ENCOUNTER — Ambulatory Visit (INDEPENDENT_AMBULATORY_CARE_PROVIDER_SITE_OTHER): Payer: 59 | Admitting: Cardiology

## 2020-08-04 ENCOUNTER — Other Ambulatory Visit: Payer: Self-pay

## 2020-08-04 ENCOUNTER — Encounter: Payer: Self-pay | Admitting: Cardiology

## 2020-08-04 VITALS — BP 124/80 | HR 101 | Ht 69.0 in | Wt 164.4 lb

## 2020-08-04 DIAGNOSIS — G4733 Obstructive sleep apnea (adult) (pediatric): Secondary | ICD-10-CM

## 2020-08-04 DIAGNOSIS — I4819 Other persistent atrial fibrillation: Secondary | ICD-10-CM

## 2020-08-04 NOTE — Addendum Note (Signed)
Addended by: Antonieta Iba on: 08/04/2020 01:47 PM   Modules accepted: Orders

## 2020-08-04 NOTE — Patient Instructions (Addendum)
Medication Instructions:  Your physician recommends that you continue on your current medications as directed. Please refer to the Current Medication list given to you today.  *If you need a refill on your cardiac medications before your next appointment, please call your pharmacy*   Lab Work: TODAY: BMET, CBC If you have labs (blood work) drawn today and your tests are completely normal, you will receive your results only by: Marland Kitchen MyChart Message (if you have MyChart) OR . A paper copy in the mail If you have any lab test that is abnormal or we need to change your treatment, we will call you to review the results.  Follow-Up: At [facility], you and your health needs are our priority.  As part of our continuing mission to provide you with exceptional heart care, we have created designated Provider Care Teams.  These Care Teams include your primary Cardiologist (physician) and Advanced Practice Providers (APPs -  Physician Assistants and Nurse Practitioners) who all work together to provide you with the care you need, when you need it.  Your next appointment:   1 year(s)  The format for your next appointment:   In Person  Provider:   Fransico Him, MD   Follow up with [name] ASAP

## 2020-08-04 NOTE — Progress Notes (Signed)
Date:  08/04/2020   ID:  [name], DOB [birth year], MRN [id number]  Patient Location:  Home  Provider location:   [place]  PCP:  Velna Ochs, MD  Sleep medicine:  Fransico Him, MD Electrophysiologist:  Cristopher Peru, MD   Chief Complaint:  OSA  History of Present Illness:    Juan Shaffer is a 79 y.o. male with a hx of atrial flutter, mild LV dysfunction secondary to tachycardia and OSA on CPAP.  He was initially referred for sleep evaluation due to snoring, witnessed apnea and excessive daytime fatigue. His PSG showed mild OSA with an AHI of 12.6/hr and oxygen desaturations as low as 85%. He underwent CPAP titration and is now on CPAP at 9cm H2O.   He is doing well with his CPAP device and thinks that he has gotten used to it.  He tolerates the mask and feels the pressure is adequate.  He only gets 4-5 hours of uninterrupted sleep due to his dog.  He denies any significant mouth or nasal dryness or nasal congestion.  He does not think that he snores.  He denies any significant palpitations but occasionally can tell some irregularity in his heart beat.   Prior CV studies:   The following studies were reviewed today:  PAP compliance download  EKG was performed today and showed atrial fibrillation/fultter at 101bpm  Past Medical History:  Diagnosis Date  . Atrial flutter with rapid ventricular response ([place]) 04/30/2015   chads2vasc score is 2  . 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 High cholesterol   . OSA (obstructive sleep apnea)    mild with AHI 12/hr   . Polymyalgia rheumatica (HCC)    followed by Dr Charlestine Night  . Prostate cancer [facility])    Past Surgical History:  Procedure Laterality Date  . ELECTROPHYSIOLOGIC STUDY N/A 05/05/2015   Procedure: A-Flutter Ablation;  Surgeon: Evans Lance, MD;  Location: [place] [facility];  Service: Cardiovascular;  Laterality: N/A;  . PROSTATECTOMY  12/2014  . TONSILLECTOMY  ~  1946     No outpatient medications have been marked as taking for the 08/04/20 encounter (Office Visit) with Sueanne Margarita, MD.     Allergies:   Patient has no known allergies.   Social History   Tobacco Use  . Smoking status: Former Smoker    Packs/day: 1.00    Years: 8.00    Pack years: 8.00    Types: Cigarettes    Quit date: 10/22/1967    Years since quitting: 52.8  . Smokeless tobacco: Never Used  Substance Use Topics  . Alcohol use: Yes    Alcohol/week: 6.0 standard drinks    Types: 3 Glasses of wine, 3 Shots of liquor per week  . Drug use: No     Family Hx: The patient's family history includes Arthritis in his father; Parkinson's disease in his mother; Stroke in his unknown relative.  ROS:   Please see the history of present illness.     All other systems reviewed and are negative.   Labs/Other Tests and Data Reviewed:    Recent Labs: No results found for requested labs within last 8760 hours.   Recent Lipid Panel No results found for: CHOL, TRIG, HDL, CHOLHDL, LDLCALC, LDLDIRECT  Wt Readings from Last 3 Encounters:  04/19/19 162 lb 9.6 oz (73.8 kg)  04/09/19 158 lb (71.7 kg)  02/17/18 163 lb 9.6 oz (74.2 kg)  Objective:    Vital Signs:  There were no vitals taken for this visit.   GEN: Well nourished, well developed in no acute distress HEENT: Normal NECK: No JVD; No carotid bruits LYMPHATICS: No lymphadenopathy CARDIAC:irregularly irregular, no murmurs, rubs, gallops RESPIRATORY:  Clear to auscultation without rales, wheezing or rhonchi  ABDOMEN: Soft, non-tender, non-distended MUSCULOSKELETAL:  No edema; No deformity  SKIN: Warm and dry NEUROLOGIC:  Alert and oriented x 3 PSYCHIATRIC:  Normal affect     ASSESSMENT & PLAN:    1. OSA -  The PAP download was reviewed today and showed an AHI of 2.5/hr on 9 cm H2O with 83% compliance in using more than 4 hours nightly.  The patient has been using and benefiting from PAP use and will  continue to benefit from therapy.   2.  Persistent atrial fibrillation -HR still elevated mildly -He sees [name] for afib and recommended either continuing on BB or dofetilide initiation -I will have him set up an appt with [name] to discuss this.  -continue Tenormin 50mg  BID>>higher doses of BB make him fatigued -continue Eliquis 5mg  BID -check BMET and CBC   Medication Adjustments/Labs and Tests Ordered: Current medicines are reviewed at length with the patient today.  Concerns regarding medicines are outlined above.  Tests Ordered: No orders of the defined types were placed in this encounter.  Medication Changes: No orders of the defined types were placed in this encounter.   Disposition:  Follow up in 1 year(s)  Signed, Fransico Him, MD  08/04/2020 12:56 PM    [place]

## 2020-08-05 LAB — BASIC METABOLIC PANEL
BUN/Creatinine Ratio: 15 (ref 10–24)
BUN: 17 mg/dL (ref 8–27)
CO2: 21 mmol/L (ref 20–29)
Calcium: 9.4 mg/dL (ref 8.6–10.2)
Chloride: 104 mmol/L (ref 96–106)
Creatinine, Ser: 1.17 mg/dL (ref 0.76–1.27)
GFR calc Af Amer: 68 mL/min/{1.73_m2} (ref >59)
GFR calc non Af Amer: 59 mL/min/{1.73_m2} — ABNORMAL LOW (ref >59)
Glucose: 110 mg/dL — ABNORMAL HIGH (ref 65–99)
Potassium: 5 mmol/L (ref 3.5–5.2)
Sodium: 140 mmol/L (ref 134–144)

## 2020-08-05 LAB — CBC
Hematocrit: 46.9 % (ref 37.5–51.0)
Hemoglobin: 16.4 g/dL (ref 13.0–17.7)
MCH: 31 pg (ref 26.6–33.0)
MCHC: 35 g/dL (ref 31.5–35.7)
MCV: 89 fL (ref 79–97)
Platelets: 242 10*3/uL (ref 150–450)
RBC: 5.29 x10E6/uL (ref 4.14–5.80)
RDW: 13.4 % (ref 11.6–15.4)
WBC: 10.7 10*3/uL (ref 3.4–10.8)

## 2020-08-16 ENCOUNTER — Other Ambulatory Visit: Payer: Self-pay | Admitting: Internal Medicine

## 2020-08-29 NOTE — Progress Notes (Signed)
Cardiology Office Note Date:  09/01/2020  Patient ID:  Juan Shaffer, Juan Shaffer 1941/01/28, MRN [id number] PCP:  Kristopher Glee., MD  Cardiologist:  Dr. Radford Pax Electrophysiologist: [name]    Chief Complaint: rhythm control options/recommendations  History of Present Illness: Juan Shaffer is a 80 y.o. 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, last seen by him Aug 2020, at that time doing OK, mentioned some degree of fatigue/weakness, though swimming for exercise.  AFib becoming more persistent though the pt felt still having times of normal rhythm. Discussed perhaps Tikosyn, the pt felt to be minimally symptomatic and would call if decided to pursue AAD.  Most recently saw Dr. Radford Pax 08/04/20 for his OSA, he ws doing well with CPAP mentioned some occasional palpitations, he was in Aflutter 101bpm and recommended to revisit with EP to discuss medicines , either better rate control, mentioning increased doses of BB resulted in fatigue, perhaps pursue rhythm control.  TODAY He feels well. When he was found to have AF and started on the atenolol he felt like the medicine made him feel fatigued.  Shortly afterwards started on prednisone for generalizde joint pain associated with his PMR and felt better. He is not entirely certain what fatigues him the medicine or the PMR, likely both. He has no overt symptoms with his AFib. He checks his pulse regularly though and feels like every 2-3 days he has 2-3 days of AFib, his pulse when in normal rhythm is 50's-60's and his Afib rates generally 100-110 He is still swimming with good exertional capacity No CP, no SOB. He will get infrequently lightheaded upon standing, no near syncope or syncope. Reports compliance with his Eliquis, no bleeding or signs of bleeding.  He would like to consider getting started on  Dofetilide. [name] discussed this as an option for him and he would [facility] pursue it. We dicussed Tikosyn hospital stay and potential risks associated with Tikosyn as well.   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     Accu-Chek FastClix Lancets MISC Use to check blood sugar once daily.     ACCU-CHEK GUIDE test strip      alendronate (FOSAMAX) 70 MG tablet Take 70 mg by mouth once a week.     apixaban (ELIQUIS) 5 MG TABS tablet Take 1 tablet (5 mg total) by mouth 2 (two) times daily. 180 tablet 1   atenolol (TENORMIN) 50 MG tablet TAKE 1 TABLET (50 MG TOTAL) BY MOUTH 2 (TWO) TIMES DAILY. PLEASE MAKE OVERDUE APPT WITH [name] BEFORE ANYMORE REFILLS. [place]  2ND ATTEMPT 30 tablet 0   atorvastatin (LIPITOR) 20 MG tablet TAKE 1 TABLET BY MOUTH EVERYDAY AT BEDTIME 60 tablet 0   Blood Glucose Monitoring Suppl (ACCU-CHEK GUIDE) w/Device KIT Use to check blood sugar once daily     Cholecalciferol (VITAMIN D3) 1000 units CAPS Take by mouth daily.      clobetasol cream (TEMOVATE) 3.81 % Apply 1 application topically as directed.   1   glucosamine-chondroitin 500-400 MG tablet Take 1 tablet by mouth daily.     Multiple Vitamin (MULTIVITAMIN) tablet  Take by mouth.     Multiple Vitamins-Minerals (PRESERVISION AREDS 2 PO) Take 1 capsule by mouth 2 (two) times daily.     Omega-3 1000 MG CAPS Take by mouth.     predniSONE (DELTASONE) 5 MG tablet Take 5 mg by mouth.     tadalafil (CIALIS) 20 MG tablet TAKE 1 TABLET BY MOUTH EVERY DAY AS NEEDED FOR UP TO 30 DAYS FOR ERECTILE DYSFUNCTION     No current facility-administered medications for this visit.    Allergies:   Patient has no known allergies.   Social History:  The patient  reports that he quit smoking about 52 years ago. His smoking use included cigarettes. He has a 8.00 pack-year smoking history. He has never used smokeless tobacco. He reports current alcohol use of about 6.0 standard drinks of alcohol per week. He reports that he does not use drugs.   Family History:  The patient's family history includes Arthritis in his father; Parkinson's disease in his mother; Stroke in his unknown relative.  ROS:  Please see the history of present illness.    All other systems are reviewed and otherwise negative.   PHYSICAL EXAM:  VS:  BP 118/62    Pulse (!) 58    Ht _0  (1.753 m)    Wt 165 lb (74.8 kg)    SpO2 99%    BMI 24.37 kg/m  BMI: Body mass index is 24.37 kg/m. Well nourished, well developed, in no acute distress HEENT: normocephalic, atraumatic Neck: no JVD, carotid bruits or masses Cardiac:  RRR; bradycardic, no significant murmurs, no rubs, or gallops Lungs:  CTA b/l, no wheezing, rhonchi or rales Abd: soft, nontender MS: no deformity or atrophy Ext: no edema Skin: warm and dry, no rash Neuro:  No gross deficits appreciated Psych: euthymic mood, full affect    EKG:  Done today and reviewed by myself  SB 53bpm, PR 153m, QRS 936m QTC 41039m2/13/21 is also reviewed, Afib 101bpm  07/15/15; TTE Study Conclusions  - Left ventricle: The cavity size was normal. There was moderate  focal basal hypertrophy of the septum. Systolic function was  normal. The  estimated ejection fraction was in the range of 55%  to 60%. Wall motion was normal; there were no regional wall  motion abnormalities. Doppler parameters are consistent with  abnormal left ventricular relaxation (grade 1 diastolic  dysfunction). The E/e&' ratio is between 8-15, suggesting  indeterminate LV filling pressure.  - Aortic valve: Trileaflet. Sclerosis without stenosis. There was  trivial regurgitation.  - Mitral valve: Mildly thickened leaflets . There was mild  regurgitation.  - Left atrium: The atrium was normal in size.  - Right atrium: The atrium was normal in size.  - Tricuspid valve: There was mild regurgitation.  - Pulmonary arteries: PA peak pressure: 21 mm Hg (S).  - Inferior vena cava: The vessel was normal in size. The  respirophasic diameter changes were in the normal  range (>= 50%),  consistent with normal central venous pressure.    05/05/2015: EPS/ablation CONCLUSIONS:  1. Isthmus-dependent right atrial flutter upon presentation.  2. Successful radiofrequency ablation of atrial flutter along the cavotricuspid isthmus with complete bidirectional isthmus block achieved.  3. No inducible arrhythmias following ablation.  4. No early apparent complications.      Recent Labs: 08/04/2020: BUN 17; Creatinine, Ser 1.17; Hemoglobin 16.4; Platelets 242; Potassium 5.0; Sodium 140  No results found for requested labs within last 8760 hours.   CrCl cannot be calculated (Patient's most recent lab result is older than the maximum 21 days allowed.).   Wt Readings from Last 3 Encounters:  09/01/20 165 lb (74.8 kg)  08/04/20 164 lb 6.4 oz (74.6 kg)  04/19/19 162 lb 9.6 oz (73.8 kg)     Other studies reviewed: Additional studies/records reviewed today include: summarized above  ASSESSMENT AND PLAN:  1. Persistent AFib     CHA2DS2Vasc is 2, on Eliquis, appropriately dosed      Sounds like his burden by his observation is about 50% He would  like to pursue Tikosyn He will be out of town a couple weeks coming up, will check drug cost with his insurance company. Will look at his work schedule as well and once ready will call the AFib clinic for an appointment. QTc looks OK, I do not appreciate any contraindicated medications.  Will get labs today Update his echo I will forward my note to the AFib clinic   Disposition: F/u with the AFib clinci as above, if he decides not to pursue Tikosyn asked to have him follow up in a 69mootherwise.  Current medicines are reviewed at length with the patient today.  The patient did not have any concerns regarding medicines.  SVenetia Night PA-C 09/01/2020 3:08 PM     [place] [phone](office)  [phone](fax)

## 2020-09-01 ENCOUNTER — Encounter (HOSPITAL_COMMUNITY): Payer: Self-pay

## 2020-09-01 ENCOUNTER — Other Ambulatory Visit: Payer: Self-pay

## 2020-09-01 ENCOUNTER — Encounter: Payer: Self-pay | Admitting: Physician Assistant

## 2020-09-01 ENCOUNTER — Other Ambulatory Visit: Payer: Self-pay | Admitting: Internal Medicine

## 2020-09-01 ENCOUNTER — Ambulatory Visit (INDEPENDENT_AMBULATORY_CARE_PROVIDER_SITE_OTHER): Payer: 59 | Admitting: Physician Assistant

## 2020-09-01 VITALS — BP 118/62 | HR 58 | Ht 69.0 in | Wt 165.0 lb

## 2020-09-01 DIAGNOSIS — I4819 Other persistent atrial fibrillation: Secondary | ICD-10-CM

## 2020-09-01 NOTE — Patient Instructions (Signed)
Medication Instructions:   Your physician recommends that you continue on your current medications as directed. Please refer to the Current Medication list given to you today.  *If you need a refill on your cardiac medications before your next appointment, please call your pharmacy*   Lab Work: . BMET MAG AND CBC TODAY   If you have labs (blood work) drawn today and your tests are completely normal, you will receive your results only by: Marland Kitchen MyChart Message (if you have MyChart) OR . A paper copy in the mail If you have any lab test that is abnormal or we need to change your treatment, we will call you to review the results.   Testing/Procedures: Your physician has requested that you have an echocardiogram. Echocardiography is a painless test that uses sound waves to create images of your heart. It provides your doctor with information about the size and shape of your heart and how well your heart's chambers and valves are working. This procedure takes approximately one hour. There are no restrictions for this procedure.   Follow-Up: At [facility], you and your health needs are our priority.  As part of our continuing mission to provide you with exceptional heart care, we have created designated Provider Care Teams.  These Care Teams include your primary Cardiologist (physician) and Advanced Practice Providers (APPs -  Physician Assistants and Nurse Practitioners) who all work together to provide you with the care you need, when you need it.  We recommend signing up for the patient portal called "MyChart".  Sign up information is provided on this After Visit Summary.  MyChart is used to connect with patients for Virtual Visits (Telemedicine).  Patients are able to view lab/test results, encounter notes, upcoming appointments, etc.  Non-urgent messages can be sent to your provider as well.   To learn more about what you can do with MyChart, go to [email or web address].    Your next  appointment:  CONTACT AFIB CLINIC   Provider:   You will follow up in the [facility] located at [facility].  Other Instructions

## 2020-09-01 NOTE — Telephone Encounter (Signed)
Prescription refill request for Eliquis received.   Indication: Afib Last office visit: 08/04/2020, [place] Scr: 1.17, 08/04/2020 Age: 80 yo  Weight: 74.6 kg    Pt is scheduled to see Charlcie Cradle 09/01/2020. Prescription refill sent

## 2020-09-02 ENCOUNTER — Telehealth: Payer: Self-pay | Admitting: Pharmacist

## 2020-09-02 LAB — CBC
Hematocrit: 44 % (ref 37.5–51.0)
Hemoglobin: 14.8 g/dL (ref 13.0–17.7)
MCH: 30 pg (ref 26.6–33.0)
MCHC: 33.6 g/dL (ref 31.5–35.7)
MCV: 89 fL (ref 79–97)
Platelets: 215 10*3/uL (ref 150–450)
RBC: 4.94 x10E6/uL (ref 4.14–5.80)
RDW: 13 % (ref 11.6–15.4)
WBC: 9.8 10*3/uL (ref 3.4–10.8)

## 2020-09-02 LAB — BASIC METABOLIC PANEL
BUN/Creatinine Ratio: 16 (ref 10–24)
BUN: 15 mg/dL (ref 8–27)
CO2: 24 mmol/L (ref 20–29)
Calcium: 9.4 mg/dL (ref 8.6–10.2)
Chloride: 104 mmol/L (ref 96–106)
Creatinine, Ser: 0.96 mg/dL (ref 0.76–1.27)
GFR calc Af Amer: 86 mL/min/{1.73_m2} (ref >59)
GFR calc non Af Amer: 74 mL/min/{1.73_m2} (ref >59)
Glucose: 87 mg/dL (ref 65–99)
Potassium: 4.2 mmol/L (ref 3.5–5.2)
Sodium: 139 mmol/L (ref 134–144)

## 2020-09-02 LAB — MAGNESIUM: Magnesium: 2.1 mg/dL (ref 1.6–2.3)

## 2020-09-02 NOTE — Telephone Encounter (Signed)
Medication list reviewed in anticipation of upcoming Tikosyn initiation. Patient is not taking any contraindicated or QTc prolonging medications.   Patient is anticoagulated on Eliquis on the appropriate dose. Please ensure that patient has not missed any anticoagulation doses in the 3 weeks prior to Tikosyn initiation.   Patient will need to be counseled to avoid use of Benadryl while on Tikosyn and in the 2-3 days prior to Tikosyn initiation.  

## 2020-09-11 ENCOUNTER — Telehealth: Payer: Self-pay | Admitting: Internal Medicine

## 2020-09-11 NOTE — Telephone Encounter (Signed)
New message   Pt c/o medication issue:  1. Name of Medication: Eliquis   2. How are you currently taking this medication (dosage and times per day)?   3. Are you having a reaction (difficulty breathing--STAT)? no  4. What is your medication issue? Pt said his copay ran out. He'd like to continue taking this medication and he needs it to be pre approved. Pharmacy-[facility] on [address]

## 2020-09-12 NOTE — Telephone Encounter (Signed)
**Note De-Identified Juan Shaffer Obfuscation** I called [facility] and was advised that the a Prior Auth is not needed for the pts Eliquis as his ins is paying their part. His cost is $889/90 day supply due to $10,000 deductible. The pt does have commercial ins so I called to discuss.  He states that he has tried multiple times to re-activate his copay card for Eliquis through the Eliquis web site but is having problems. I gave him their phone number (1-855-ELIQUIS) to call to activate a new Eliquis co-pay card with a representative over the phone.  He verbalized understanding and thanked me for calling him to discuss.

## 2020-09-15 ENCOUNTER — Telehealth: Payer: Self-pay | Admitting: Internal Medicine

## 2020-09-15 MED ORDER — ATENOLOL 50 MG PO TABS: 50.0000 mg | ORAL_TABLET | Freq: Two times a day (BID) | ORAL | 3 refills | Status: DC

## 2020-09-15 NOTE — Telephone Encounter (Signed)
Pt's medication was sent to pt's pharmacy as requested. Confirmation received.  °

## 2020-09-15 NOTE — Telephone Encounter (Signed)
New message    *STAT* If patient is at the pharmacy, call can be transferred to refill team.   1. Which medications need to be refilled? (please list name of each medication and dose if known) Atenolol 50 mg   2. Which pharmacy/location (including street and city if local pharmacy) is medication to be sent to? [facility] [address]  3. Do they need a 30 day or 90 day supply? 90 day supply

## 2020-09-15 NOTE — Addendum Note (Signed)
Addended by: Carter Kitten D on: 09/15/2020 10:04 AM   Modules accepted: Orders

## 2020-09-15 NOTE — Telephone Encounter (Signed)
Pt's medication atenolol 50 mg tablet was phone into pt's pharmacy because it could not be sent electronically. Pharmacy tech verbalized understanding.

## 2020-09-20 ENCOUNTER — Other Ambulatory Visit: Payer: Self-pay | Admitting: Physician Assistant

## 2020-09-22 ENCOUNTER — Other Ambulatory Visit: Payer: Self-pay

## 2020-09-22 ENCOUNTER — Ambulatory Visit (HOSPITAL_COMMUNITY): Payer: 59 | Attending: Cardiovascular Disease

## 2020-09-22 DIAGNOSIS — I4819 Other persistent atrial fibrillation: Secondary | ICD-10-CM | POA: Insufficient documentation

## 2020-09-22 LAB — ECHOCARDIOGRAM COMPLETE: S' Lateral: 2 cm

## 2020-10-14 ENCOUNTER — Telehealth (HOSPITAL_COMMUNITY): Payer: Self-pay | Admitting: *Deleted

## 2020-10-14 NOTE — Telephone Encounter (Signed)
Called to schedule tikosyn admission - pt not ready to commit. He will call when he is ready to proceed.

## 2020-11-27 ENCOUNTER — Other Ambulatory Visit: Payer: Self-pay

## 2020-11-27 ENCOUNTER — Ambulatory Visit (INDEPENDENT_AMBULATORY_CARE_PROVIDER_SITE_OTHER): Payer: 59 | Admitting: Sports Medicine

## 2020-11-27 ENCOUNTER — Encounter: Payer: Self-pay | Admitting: Sports Medicine

## 2020-11-27 VITALS — BP 138/80 | Ht 69.0 in | Wt 160.0 lb

## 2020-11-27 DIAGNOSIS — M25559 Pain in unspecified hip: Secondary | ICD-10-CM

## 2020-11-27 NOTE — Progress Notes (Signed)
   Subjective:    Patient ID: Juan Shaffer, male    DOB: 01-19-1941, 80 y.o.   MRN: [id number]  HPI chief complaint: Bilateral hip pain, right greater than left  Juan Shaffer presents today with approximately 2 weeks of lateral right hip pain.  Pain began without any trauma.  In fact, he states that about 3 weeks ago he was able to play golf without any pain.  His medical history is significant for polymyalgia rheumatica.  He is currently on oral steroids but is tapering down.  When playing golf 3 weeks ago he was taking 5 mg daily but he has recently tapered to 3 mg daily and is scheduled to see his rheumatologist again at the end of May.  Pain is most noticeable when going from a sitting to a standing position and with walking.  Pain can be severe enough that he feels like it may cause him to fall.  He denies any groin pain.  Occasional nighttime pain when sleeping on his side but this is not every night.  He performs a series of hip strengthening exercises 3 to 4 days a week.  Denies any recent trauma.  X-rays of his hips in 2018 showed only minimal degenerative changes.  Past medical history reviewed Medications reviewed Allergies reviewed    Review of Systems    As above Objective:   Physical Exam  Well-developed, well-nourished.  No acute distress.  Examination of both hips show smooth painless hip range of motion with a negative logroll.  Slight tenderness to palpation along the posterior greater trochanter on the right.  No palpable defect.  He has 4/5 strength with resisted hip abduction on the right and 5/5 on the left.  Negative straight leg raise.  Neurovascularly intact distally.  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  I would like to treat this with modifying his home exercise program. I have asked him to start 3 sets of 30 reps daily of hip abduction exercises, and he will follow-up with me again the week of May 9.  I did discuss the possibility of formal physical therapy if symptoms linger.  He spends quite a bit of time in [place], [place] so if physical therapy is to be done it will need to be there.

## 2020-12-23 ENCOUNTER — Other Ambulatory Visit: Payer: Self-pay

## 2020-12-23 DIAGNOSIS — M25559 Pain in unspecified hip: Secondary | ICD-10-CM

## 2020-12-23 NOTE — Progress Notes (Signed)
Pt wife called asking for PT referral for rt hip. It was discussed with Dr. Micheline Chapman at last visit. He is in town most of this month and wants to get it scheduled.  [facility] is requested

## 2020-12-30 ENCOUNTER — Ambulatory Visit: Payer: 59 | Admitting: Sports Medicine

## 2020-12-31 ENCOUNTER — Ambulatory Visit: Payer: 59 | Attending: Sports Medicine

## 2020-12-31 ENCOUNTER — Other Ambulatory Visit: Payer: Self-pay

## 2020-12-31 DIAGNOSIS — M6281 Muscle weakness (generalized): Secondary | ICD-10-CM | POA: Diagnosis present

## 2020-12-31 DIAGNOSIS — M25551 Pain in right hip: Secondary | ICD-10-CM

## 2020-12-31 DIAGNOSIS — M25651 Stiffness of right hip, not elsewhere classified: Secondary | ICD-10-CM | POA: Diagnosis present

## 2020-12-31 NOTE — Patient Instructions (Signed)
Access Code: [email or web address] URL: [email or web address] Date: 12/31/2020 Prepared by: Claiborne Billings  Exercises Hooklying Single Knee to Chest Stretch - 2 x daily - 7 x weekly - 1 sets - 3 reps - 20 hold Clamshell - 2 x daily - 7 x weekly - 2 sets - 10 reps Sidelying Hip Abduction - 2 x daily - 7 x weekly - 2 sets - 10 reps Seated Hamstring Stretch - 3 x daily - 7 x weekly - 1 sets - 3 reps - 20 hold Seated Figure 4 Piriformis Stretch - 1 x daily - 7 x weekly - 3 sets - 10 reps

## 2020-12-31 NOTE — Therapy (Signed)
[facility] [address] [name], [place], [address] Phone: [phone]   Fax:  [phone]  Physical Therapy Evaluation  Patient Details  Name: Juan Shaffer MRN: [id number] Date of Birth: [birth year] Referring Provider (Juan Shaffer): Lilia Argue, MD   Encounter Date: 12/31/2020   Juan Shaffer End of Session - 12/31/20 0839    Visit Number 1    Date for Juan Shaffer Re-Evaluation 02/25/21    Authorization Type UHC-23 visit limit    Authorization - Visit Number 1    Authorization - Number of Visits 23    Progress Note Due on Visit 10    Juan Shaffer Start Time 0759    Juan Shaffer Stop Time 0843    Juan Shaffer Time Calculation (min) 44 min    Activity Tolerance Patient tolerated treatment well    Behavior During Therapy [facility] for tasks assessed/performed           Past Medical History:  Diagnosis Date  . Atrial flutter with rapid ventricular response ([place]) 04/30/2015   chads2vasc score is 2  . 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 High cholesterol   . OSA (obstructive sleep apnea)    mild with AHI 12/hr   . Polymyalgia rheumatica (HCC)    followed by Dr Charlestine Night  . Prostate cancer [facility])     Past Surgical History:  Procedure Laterality Date  . ELECTROPHYSIOLOGIC STUDY N/A 05/05/2015   Procedure: A-Flutter Ablation;  Surgeon: Evans Lance, MD;  Location: [place] [facility];  Service: Cardiovascular;  Laterality: N/A;  . PROSTATECTOMY  12/2014  . TONSILLECTOMY  ~ 1946    There were no vitals filed for this visit.    Subjective Assessment - 12/31/20 0802    Subjective Juan Shaffer reports Rt lateral  hip pain that began the end of March without cause or incident.  Juan Shaffer had a history of Rt hip pain ~1 year ago that resolved with oral steroids.  Juan Shaffer has history of polymyalgia rheumatica. Dr Micheline Chapman gave me an exercise that I just started doing and it seems that it is helping (sidelying hip abduction).  I am not able to play tennis or  play golf comfortably.    Pertinent History Polymyalgia rheumatica, prostate cancer, sleep apnea, ITB syndrome, HTN    How long can you walk comfortably? not able to walk the golf course due to pain.  Pain with walking in the community and antalgic gait.    Diagnostic tests none recent    Patient Stated Goals return to golf/tennis, reduce Rt hip pain    Currently in Pain? Yes    Pain Score 3     Pain Location Hip    Pain Orientation Right;Lateral    Pain Descriptors / Indicators Dull;Constant    Pain Type Chronic pain    Pain Onset More than a month ago    Pain Frequency Constant    Aggravating Factors  walking golf course, walking long periods,    Pain Relieving Factors rest, sitting              [facility] Juan Shaffer Assessment - 12/31/20 0001      Assessment   Medical Diagnosis hip pain    Referring Provider (Juan Shaffer) Lilia Argue, MD    Onset Date/Surgical Date 11/20/20    Next MD Visit 01/02/21    Prior Therapy none      Precautions   Precautions None      Balance Screen  Has the patient fallen in the past 6 months No    Has the patient had a decrease in activity level because of a fear of falling?  No    Is the patient reluctant to leave their home because of a fear of falling?  No      Home Environment   Living Environment Private residence    Living Arrangements Spouse/significant other    Type of [place] to enter    Home Layout Two level      Prior Function   Level of Independence Independent    Vocation Full time employment    Vocation Requirements desk work    Leisure golf, tennis, Higher education careers adviser   Overall Cognitive Status Within Functional Limits for tasks assessed      Observation/Other Assessments   Focus on Therapeutic Outcomes (FOTO)  63 (72)      ROM / Strength   AROM / PROM / Strength AROM;PROM;Strength      AROM   Overall AROM  Deficits    Overall AROM Comments hamstring length and hip ER limited by 25% actively       PROM   Overall PROM  Deficits    Overall PROM Comments 25-50% limitation in bil hip flexibility      Strength   Overall Strength Deficits    Overall Strength Comments Rt hip 4/5, knee 4+/5, Lt hip 4+/5, Lt knee 5/5      Palpation   Palpation comment tension in Rt gluteals without pain.  Mild palpable tendenress over Lt trochanteric bursa and lateral quad.      Transfers   Transfers Independent with all Transfers      Ambulation/Gait   Ambulation/Gait Yes    Gait Pattern Step-through pattern;Decreased weight shift to right;Decreased stance time - right    Gait Comments SLS on Rt x 10 seconds with pelvic drop, Lt x 10 seconds with level pelvis                      Objective measurements completed on examination: See above findings.               Juan Shaffer Education - 12/31/20 0837    Education Details Access Code: [email or web address]    Person(s) Educated Patient    Methods Explanation;Demonstration;Handout    Comprehension Verbalized understanding;Returned demonstration            Juan Shaffer Short Term Goals - 12/31/20 0802      Juan Shaffer SHORT TERM GOAL #1   Title be independent in initial HEP    Time 4    Period Weeks    Status New    Target Date 01/28/21      Juan Shaffer SHORT TERM GOAL #2   Title report a 30% reduction in Rt hip pain with standing and walking    Time 4    Period Weeks    Status New    Target Date 01/28/21             Juan Shaffer Long Term Goals - 12/31/20 0811      Juan Shaffer LONG TERM GOAL #1   Title be independent in advanced HEP    Time 8    Period Weeks    Status New    Target Date 02/25/21      Juan Shaffer LONG TERM GOAL #2   Title improve FOTO to > or = to 72 to improve function  Time 8    Period Weeks    Status New    Target Date 02/25/21      Juan Shaffer LONG TERM GOAL #3   Title play golf without limitation due to Rt hip pain    Time 8    Period Weeks    Status New    Target Date 02/25/21      Juan Shaffer LONG TERM GOAL #4   Title demonstrate 4+/5 Rt hip strength to  improve stability on unlevel surfaces and with endurance tasks    Time 8    Period Weeks    Status New    Target Date 02/25/21      Juan Shaffer LONG TERM GOAL #5   Title report a 70% reduction in Rt hip pain with standing and walking to reduce antalgia and improve tolerance for community function    Time 8    Period Weeks    Status New    Target Date 02/25/21                  Plan - 12/31/20 0903    Clinical Impression Statement Juan Shaffer presents to Juan Shaffer with Rt lateral hip pain that began ~6 weeks ago without incident or injury.  Juan Shaffer reports that he had this pain ~1 year ago and it resolved with oral steroids.  Juan Shaffer has history of polymyalgia rheumatica and intermittently takes steroids for this.  Juan Shaffer is active with golf, tennis and swimming and has not been playing golf or tennis due to Rt hip pain.  No recent imaging has been done.  Juan Shaffer rates pain as 3/10 dull and constant in the lateral Rt leg at trochanteric bursa and extending to the knee at times.  Juan Shaffer denies any limitation with long distance ambulation and states that it is painful (3/10) and gait becomes antalgic with longer distance ambulation.  Juan Shaffer demonstrated symmetry with gait on level surface and steps for short distance ambulation.  Juan Shaffer demonstrates reduced hip flexibility bilaterally and reduced knee and hip strength bilaterally.  Tension in Rt gluteals without pain and mild palpable tenderness over Rt lateral quad and greater trochanter.  Juan Shaffer will benefit from skilled Juan Shaffer to address Rt hip/leg pain, hip stiffness and strength to improve functional mobility.    Personal Factors and Comorbidities Comorbidity 1    Comorbidities polymyalgia rheumatica    Examination-Activity Limitations Squat;Stand    Examination-Participation Restrictions Community Activity    Stability/Clinical Decision Making Stable/Uncomplicated    Clinical Decision Making Low    Rehab Potential Good    Juan Shaffer Frequency 2x / week    Juan Shaffer Duration 8 weeks    Juan Shaffer  Treatment/Interventions ADLs/Self Care Home Management;Cryotherapy;Electrical Stimulation;Moist Heat;Gait training;Stair training;Functional mobility training;Therapeutic activities;Therapeutic exercise;Neuromuscular re-education;Patient/family education;Manual techniques;Passive range of motion;Dry needling;Taping;Spinal Manipulations;Joint Manipulations    Juan Shaffer Next Visit Plan Juan Shaffer out of town x 2 weeks.  review HEP, single limb stability, dry needling to Rt glutes and quads    Juan Shaffer Home Exercise Plan Access Code: [email or web address]    Consulted and Agree with Plan of Care Patient           Patient will benefit from skilled therapeutic intervention in order to improve the following deficits and impairments:  Abnormal gait,Decreased activity tolerance,Decreased strength,Pain,Decreased range of motion,Increased muscle spasms,Impaired flexibility,Difficulty walking,Decreased endurance  Visit Diagnosis: Pain in right hip - Plan: Juan Shaffer plan of care cert/re-cert  Muscle weakness (generalized) - Plan: Juan Shaffer plan of care cert/re-cert  Stiffness of right hip, not elsewhere classified - Plan: Juan Shaffer plan  of care cert/re-cert     Problem List Patient Active Problem List   Diagnosis Date Noted  . Paroxysmal atrial fibrillation ([place]) 04/19/2019  . Hypertension 04/19/2019  . Atrial fibrillation with RVR ([place]) 06/09/2017  . Hip pain 12/09/2016  . Iliotibial band syndrome of both sides 11/25/2016  . Acute pain of right shoulder 07/21/2016  . OSA (obstructive sleep apnea)   . Routine history and physical examination of adult 11/05/2015  . 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 Benign neoplasm of colon 11/04/2015  . 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 PAT (paroxysmal atrial tachycardia) ([place]) 11/04/2015  . Polyarthralgia 11/04/2015  . Skin cysts, generalized 11/04/2015  . Acute systolic congestive heart failure ([place])   . Atrial flutter with rapid ventricular response ([place]) 05/01/2015  . Polymyalgia rheumatica ([place])  05/01/2015  . History of prostate cancer 05/01/2015  . Hypercholesterolemia 05/01/2015  . Atrial flutter ([place]) 04/30/2015  . Prostate cancer ([place]) 12/18/2014     Juan Shaffer, Juan Shaffer 12/31/20 9:09 AM  [facility] [address], [place], [place], [address] Phone: [phone]   Fax:  [phone]  Name: Juan Shaffer MRN: [id number] Date of Birth: 1940/12/15

## 2021-01-02 ENCOUNTER — Ambulatory Visit: Payer: 59 | Admitting: Family Medicine

## 2021-02-03 ENCOUNTER — Other Ambulatory Visit: Payer: Self-pay

## 2021-02-03 ENCOUNTER — Ambulatory Visit: Payer: 59 | Attending: Sports Medicine

## 2021-02-03 DIAGNOSIS — M6281 Muscle weakness (generalized): Secondary | ICD-10-CM

## 2021-02-03 DIAGNOSIS — M25651 Stiffness of right hip, not elsewhere classified: Secondary | ICD-10-CM

## 2021-02-03 DIAGNOSIS — M25551 Pain in right hip: Secondary | ICD-10-CM

## 2021-02-03 NOTE — Therapy (Signed)
[facility] [address] [place], [place], [address] Phone: [phone]   Fax:  [phone]  Physical Therapy Treatment  Patient Details  Name: Juan Shaffer MRN: [id number] Date of Birth: June 20, 1941 Referring Provider (PT): Lilia Argue, MD   Encounter Date: 02/03/2021   PT End of Session - 02/03/21 0935     Visit Number 2    Date for PT Re-Evaluation 02/25/21    Authorization Type UHC-23 visit limit    Authorization - Visit Number 2    Authorization - Number of Visits 23    Progress Note Due on Visit 10    PT Start Time 0847    PT Stop Time 0925    PT Time Calculation (min) 38 min    Activity Tolerance Patient tolerated treatment well    Behavior During Therapy [facility] for tasks assessed/performed             Past Medical History:  Diagnosis Date   Atrial flutter with rapid ventricular response ([place]) 04/30/2015   chads2vasc score is 2   DCM (dilated cardiomyopathy) (HCC)    EF 45%, likely tachycardia mediated   High cholesterol    OSA (obstructive sleep apnea)    mild with AHI 12/hr    Polymyalgia rheumatica ([place])    followed by Dr Charlestine Night   Prostate cancer [facility])     Past Surgical History:  Procedure Laterality Date   ELECTROPHYSIOLOGIC STUDY N/A 05/05/2015   Procedure: A-Flutter Ablation;  Surgeon: Evans Lance, MD;  Location: [place] [facility];  Service: Cardiovascular;  Laterality: N/A;   PROSTATECTOMY  12/2014   TONSILLECTOMY  ~ 1946    There were no vitals filed for this visit.   Subjective Assessment - 02/03/21 0851     Subjective I think my hip pain is improving.  I am only doing my exercises 1x/wk.  My hip feels 25-50% better.  My walking looks and feels better- about 80% improved.    Pertinent History Polymyalgia rheumatica, prostate cancer, sleep apnea, ITB syndrome, HTN    Currently in Pain? Yes    Pain Score 3     Pain Location Hip    Pain Orientation Right    Pain  Descriptors / Indicators Dull;Constant    Pain Type Chronic pain    Pain Onset More than a month ago    Pain Frequency Constant    Pain Relieving Factors rest, sitting                               [facility] Adult PT Treatment/Exercise - 02/03/21 0001       Exercises   Exercises Knee/Hip      Knee/Hip Exercises: Stretches   Active Hamstring Stretch Both;3 reps;20 seconds    Piriformis Stretch Both;3 reps;20 seconds      Knee/Hip Exercises: Standing   Hip Abduction Stengthening;2 sets;10 reps    Hip Extension Stengthening;2 sets;10 reps      Knee/Hip Exercises: Sidelying   Hip ABduction Both;2 sets;10 reps    Clams 2x10      Manual Therapy   Manual Therapy Soft tissue mobilization    Manual therapy comments Addaday to Rt glutes, ITB and lateral quads                    PT Education - 02/03/21 0924     Education Details Access Code: [email or web address]    Person(s)  Educated Patient    Methods Explanation;Demonstration;Handout    Comprehension Verbalized understanding;Returned demonstration              PT Short Term Goals - 12/31/20 0802       PT SHORT TERM GOAL #1   Title be independent in initial HEP    Time 4    Period Weeks    Status New    Target Date 01/28/21      PT SHORT TERM GOAL #2   Title report a 30% reduction in Rt hip pain with standing and walking    Time 4    Period Weeks    Status New    Target Date 01/28/21               PT Long Term Goals - 02/03/21 0918       PT LONG TERM GOAL #1   Title be independent in advanced HEP    Status On-going      PT LONG TERM GOAL #3   Title play golf without limitation due to Rt hip pain    Baseline hasn't played golf    Status On-going      PT LONG TERM GOAL #5   Title report a 70% reduction in Rt hip pain with standing and walking to reduce antalgia and improve tolerance for community function    Baseline 25-30%    Status On-going                   Plan -  02/03/21 0932     Clinical Impression Statement Pt reports 25-50% overall improvement in Rt hip symptoms since initial session.  Walking is improved by 80% per pt report.  Pt has been doing HEP 1x/day and PT reviewed each today for technique.  Pt required verbal cueing to improve the hold time for each stretch.  PT responded well to addaday tissue mobilization and he will do this at home with his massager.  PT added functional standing strength exercises. Pt will complete at home and will return in 2 weeks.  Pt with pelvic instability and required tactile cues for level pelvis.  Pt will continue to benefit from skilled PT to address Rt hip pain, tissue mobility and strength.    PT Frequency 2x / week    PT Duration 8 weeks    PT Treatment/Interventions ADLs/Self Care Home Management;Cryotherapy;Electrical Stimulation;Moist Heat;Gait training;Stair training;Functional mobility training;Therapeutic activities;Therapeutic exercise;Neuromuscular re-education;Patient/family education;Manual techniques;Passive range of motion;Dry needling;Taping;Spinal Manipulations;Joint Manipulations    PT Next Visit Plan review HEP, single limb stability, manual to Rt glutes and quads    PT Home Exercise Plan Access Code: [email or web address]    Consulted and Agree with Plan of Care Patient             Patient will benefit from skilled therapeutic intervention in order to improve the following deficits and impairments:  Abnormal gait, Decreased activity tolerance, Decreased strength, Pain, Decreased range of motion, Increased muscle spasms, Impaired flexibility, Difficulty walking, Decreased endurance  Visit Diagnosis: Pain in right hip  Muscle weakness (generalized)  Stiffness of right hip, not elsewhere classified     Problem List Patient Active Problem List   Diagnosis Date Noted   Paroxysmal atrial fibrillation ([place]) 04/19/2019   Hypertension 04/19/2019   Atrial fibrillation with RVR ([place]) 06/09/2017   Hip  pain 12/09/2016   Iliotibial band syndrome of both sides 11/25/2016   Acute pain of right shoulder 07/21/2016   OSA (obstructive sleep apnea)  Routine history and physical examination of adult 11/05/2015   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/2015   Benign neoplasm of colon 11/04/2015   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, [place], [place], [address] Phone: [phone]   Fax:  [phone]  Name: Juan Shaffer MRN: [id number] Date of Birth: [birth year]

## 2021-02-03 NOTE — Patient Instructions (Signed)
Access Code: [email or web address] URL: [email or web address] Date: 02/03/2021 Prepared by: Claiborne Billings  Exercises   Standing Hip Abduction with Counter Support - 1 x daily - 7 x weekly - 2 sets - 10 reps Standing Hip Extension with Counter Support - 1 x daily - 7 x weekly - 2 sets - 10 reps

## 2021-03-09 ENCOUNTER — Other Ambulatory Visit: Payer: Self-pay | Admitting: Internal Medicine

## 2021-03-09 NOTE — Telephone Encounter (Signed)
Prescription refill request for Eliquis received. Indication: afib, aflutter Last office visit: Charlcie Cradle, 09/01/2020 Scr: 0.91, 10/21/2020 Age: 80 yo  Weight: 72.6 kg   Pt is on the correct dose of Eliquis per dosing criteria, prescription refill sent for Eliquis 5mg  BID.

## 2021-03-16 ENCOUNTER — Telehealth: Payer: Self-pay | Admitting: Internal Medicine

## 2021-03-16 NOTE — Telephone Encounter (Signed)
New message  Pt c/o medication issue:  1. Name of Medication: atenolol 50 mg  2. How are you currently taking this medication (dosage and times per day)? 1 tab twice daily  3. Are you having a reaction (difficulty breathing--STAT)? Dizziness   4. What is your medication issue? Pt states that he was outside walking while it was hot and got dizzy, lightheaded and heard a buzzing that he has never experience before. Pt wants to know if this could be from the medication and would like to discuss with someone

## 2021-03-16 NOTE — Telephone Encounter (Signed)
Returned call to Pt.  Advised to continue to monitor BP's.  Stay hydrated.  Avoid late morning walks.  Pt indicates understanding.

## 2021-03-20 ENCOUNTER — Telehealth: Payer: Self-pay | Admitting: Cardiology

## 2021-03-20 NOTE — Telephone Encounter (Signed)
New message:     Patient will be leaving the states in about 2 weeks and would like to speak with some one concering travel. Please advise

## 2021-03-20 NOTE — Telephone Encounter (Signed)
Left message for patient to call back  

## 2021-03-20 NOTE — Telephone Encounter (Signed)
Spoke with the patient who states that he had an episode of dizziness earlier this week while he was out walking (see phone note from 7/25). Patient just wanted to make sure there is nothing else he needs to do or should be concerned about because he is going to [place] for a couple weeks soon. Patient has not had any more episodes of dizziness since the last one. Advised to continue to stay hydrated and avoid the heat. Advised to monitor his HR and BP. He will call back if he has any further episodes.

## 2021-03-31 ENCOUNTER — Other Ambulatory Visit: Payer: Self-pay

## 2021-03-31 ENCOUNTER — Ambulatory Visit: Payer: 59 | Attending: Sports Medicine

## 2021-03-31 DIAGNOSIS — M25651 Stiffness of right hip, not elsewhere classified: Secondary | ICD-10-CM | POA: Diagnosis present

## 2021-03-31 DIAGNOSIS — M25551 Pain in right hip: Secondary | ICD-10-CM | POA: Insufficient documentation

## 2021-03-31 DIAGNOSIS — M6281 Muscle weakness (generalized): Secondary | ICD-10-CM | POA: Diagnosis present

## 2021-03-31 NOTE — Therapy (Signed)
[facility] [address], [place], [place], [address] Phone: [phone]   Fax:  [phone]  Physical Therapy Treatment  Patient Details  Name: Juan Shaffer MRN: [id number] Date of Birth: 1940-10-19 Referring Provider (PT): Lilia Argue, MD   Encounter Date: 03/31/2021   PT End of Session - 03/31/21 0909     Visit Number 3    Authorization - Visit Number 3    Authorization - Number of Visits 23    PT Start Time 0845    PT Stop Time 0908    PT Time Calculation (min) 23 min    Activity Tolerance Patient tolerated treatment well    Behavior During Therapy [facility] for tasks assessed/performed             Past Medical History:  Diagnosis Date   Atrial flutter with rapid ventricular response ([place]) 04/30/2015   chads2vasc score is 2   DCM (dilated cardiomyopathy) (HCC)    EF 45%, likely tachycardia mediated   High cholesterol    OSA (obstructive sleep apnea)    mild with AHI 12/hr    Polymyalgia rheumatica ([place])    followed by Dr Charlestine Night   Prostate cancer [facility])     Past Surgical History:  Procedure Laterality Date   ELECTROPHYSIOLOGIC STUDY N/A 05/05/2015   Procedure: A-Flutter Ablation;  Surgeon: Evans Lance, MD;  Location: [place] [facility];  Service: Cardiovascular;  Laterality: N/A;   PROSTATECTOMY  12/2014   TONSILLECTOMY  ~ 1946    There were no vitals filed for this visit.   Subjective Assessment - 03/31/21 0849     Subjective Lapse in treatment.  I am doing much better.  I am able to do my regular activity.  I feel 75-80% better since starting PT.  I am no longer taking prednisone.    How long can you walk comfortably? not able to walk the golf course due to pain.  Pain with walking in the community and antalgic gait.    Patient Stated Goals return to golf/tennis, reduce Rt hip pain    Pain Score 0-No pain    Pain Location Hip                [facility] PT Assessment - 03/31/21 0001        Assessment   Medical Diagnosis hip pain    Referring Provider (PT) Lilia Argue, MD    Onset Date/Surgical Date 11/20/20      Prior Function   Level of Independence Independent    Vocation Full time employment    Vocation Requirements desk work    Leisure golf, tennis, swimming      Cognition   Overall Cognitive Status Within Functional Limits for tasks assessed      Observation/Other Assessments   Focus on Therapeutic Outcomes (FOTO)  90      Strength   Overall Strength Comments Rt hip 4+/5 throughout                           [facility] Adult PT Treatment/Exercise - 03/31/21 0001       Knee/Hip Exercises: Standing   Hip Abduction Stengthening;2 sets;10 reps    Hip Extension Stengthening;2 sets;10 reps      Knee/Hip Exercises: Sidelying   Clams 2x10- added red band to HEP  PT Short Term Goals - 12/31/20 0802       PT SHORT TERM GOAL #1   Title be independent in initial HEP    Time 4    Period Weeks    Status New    Target Date 01/28/21      PT SHORT TERM GOAL #2   Title report a 30% reduction in Rt hip pain with standing and walking    Time 4    Period Weeks    Status New    Target Date 01/28/21               PT Long Term Goals - 03/31/21 0851       PT LONG TERM GOAL #1   Title be independent in advanced HEP    Status Achieved      PT LONG TERM GOAL #2   Title improve FOTO to > or = to 72 to improve function    Baseline 90    Status Achieved      PT LONG TERM GOAL #3   Title play golf without limitation due to Rt hip pain    Baseline none    Status Achieved      PT LONG TERM GOAL #4   Title demonstrate 4+/5 Rt hip strength to improve stability on unlevel surfaces and with endurance tasks    Status Achieved      PT LONG TERM GOAL #5   Title report a 70% reduction in Rt hip pain with standing and walking to reduce antalgia and improve tolerance for community function    Baseline 75-80%     Status Achieved                   Plan - 03/31/21 0910     Clinical Impression Statement Pt with lapse in treatment since 02/03/21.  Pt reports 75-80% overall improvement since the start of care.  FOTO is improved to 90, meeting the goal.  Pt has returned to playing golf without limitation or pain.  Rt hip strength is improved. PT added red band to sidelying clams and reviewed standing strength exercises.  Pt will continue with these for continued gains.  Pt will D/C to HEP.    PT Next Visit Plan D/C PT today    PT Home Exercise Plan Access Code: [email or web address]    Recommended Other Services cert rerouted 9/0/24             Patient will benefit from skilled therapeutic intervention in order to improve the following deficits and impairments:     Visit Diagnosis: Pain in right hip - Plan: PT plan of care cert/re-cert  Muscle weakness (generalized) - Plan: PT plan of care cert/re-cert  Stiffness of right hip, not elsewhere classified - Plan: PT plan of care cert/re-cert     Problem List Patient Active Problem List   Diagnosis Date Noted   Paroxysmal atrial fibrillation ([place]) 04/19/2019   Hypertension 04/19/2019   Atrial fibrillation with RVR ([place]) 06/09/2017   Hip pain 12/09/2016   Iliotibial band syndrome of both sides 11/25/2016   Acute pain of right shoulder 07/21/2016   OSA (obstructive sleep apnea)    Routine history and physical examination of adult 11/05/2015   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/2015   Benign neoplasm of colon 11/04/2015   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   PAT (paroxysmal atrial tachycardia) ([place]) 11/04/2015   Polyarthralgia 11/04/2015   Skin cysts, generalized 09/73/5329   Acute systolic congestive heart failure ([place])  Atrial flutter with rapid ventricular response ([place]) 05/01/2015   Polymyalgia rheumatica ([place]) 05/01/2015   History of prostate cancer 05/01/2015   Hypercholesterolemia 05/01/2015   Atrial flutter ([place]) 04/30/2015   Prostate  cancer ([place]) 12/18/2014   PHYSICAL THERAPY DISCHARGE SUMMARY  Visits from Start of Care: 3  Current functional level related to goals / functional outcomes: See above for current status.     Remaining deficits: Pt denies any significant deficits at this time.    Education / Equipment: HEP   Patient agrees to discharge. Patient goals were met. Patient is being discharged due to meeting the stated rehab goals.  Sigurd Sos, PT 03/31/21 9:13 AM   [facility] [address] [place], [place], [address] Phone: [phone]   Fax:  [phone]  Name: ERLING ARRAZOLA MRN: [id number] Date of Birth: 17-Jul-1941

## 2021-04-03 ENCOUNTER — Other Ambulatory Visit: Payer: Self-pay | Admitting: Internal Medicine

## 2021-04-03 NOTE — Telephone Encounter (Signed)
  *  STAT* If patient is at the pharmacy, call can be transferred to refill team.   1. Which medications need to be refilled? (please list name of each medication and dose if known) atenolol (TENORMIN) 50 MG tablet  2. Which pharmacy/location (including street and city if local pharmacy) is medication to be sent to? [id number] [place] - [address]  3. Do they need a 30 day or 90 day supply? 90 days  Pt will be leaving out of the country tomorrow and needs refill today, pt is in the pharmacy now

## 2021-04-03 NOTE — Telephone Encounter (Signed)
Refilled

## 2021-04-29 ENCOUNTER — Telehealth: Payer: Self-pay | Admitting: *Deleted

## 2021-04-29 NOTE — Telephone Encounter (Signed)
Patient called asking for a Rx for a new cpap. He is due for a 1 tear appt in Dec.  Set pressure 9 cmH2O EPR Fulltime EPR level 3

## 2021-04-30 NOTE — Telephone Encounter (Signed)
Patient was returning call 

## 2021-05-01 NOTE — Telephone Encounter (Signed)
Called and spoke to patient. Made him aware Dr. Radford Pax has given him orders for his CPAP. Patient states that he orders his CPAP stuff online and that they are requesting a prescription be sent with orders. Dr. Radford Pax completed Rx and I made patient aware that I will mail to him since he is in [place] where he resides for part of the year. Patient verbalized understanding and thanked me for the call.

## 2021-09-11 ENCOUNTER — Other Ambulatory Visit: Payer: Self-pay | Admitting: Internal Medicine

## 2021-09-11 NOTE — Telephone Encounter (Incomplete Revision)
Eliquis 5mg  refill request received. Patient is 81 years old, weight-72.6kg, Crea-0.91 on 10/21/2020 via [facility] from [facility], Diagnosis-Afib, and last seen by [name] on 09/01/2020 and was supposed to follow up with Afib Clinic per note. Pt NEEDS AN APPOINTMENT WITH A CARDIOLOGY PROVIDER. Dose is appropriate based on dosing criteria.   Pt needs an appointment with a Provider and per Renee's note he was due to follow up with Afib Clinic. Will need to call pt.   Called to and there was no answer so left a message to call back to get scheduled for an appt.

## 2021-09-11 NOTE — Telephone Encounter (Addendum)
Eliquis 5mg  refill request received. Patient is 81 years old, weight-72.6kg, Crea-0.91 on 10/21/2020 via [facility] from [facility], Diagnosis-Afib, and last seen by [name] on 09/01/2020 and was supposed to follow up with Afib Clinic per note. Pt NEEDS AN APPOINTMENT WITH A CARDIOLOGY PROVIDER. Dose is appropriate based on dosing criteria.   Pt needs an appointment with a Provider and per Renee's note he was due to follow up with Afib Clinic. Will need to call pt.   Called to and there was no answer so left a message to call back to get scheduled for an appt.

## 2021-09-15 NOTE — Telephone Encounter (Signed)
Pt returned call to clinic, advised he is overdue for afib clinic follow-up.  Will forward message to schedulers at pt's request to call pt back at [phone] to schedule follow-up appt.  Pt states he will be out of Eliquis tomorrow, will send in refill to pharmacy today so pt does not run out of medication.  Pt is aware must see provider prior to future refills. Message forwarded to schedulers to make f/u appt in afib clinic. Refill sent to pharmacy.

## 2021-10-11 NOTE — Progress Notes (Signed)
Cardiology Office Note Date:  10/11/2021  Patient ID:  Juan Shaffer [birth year], MRN [id number] PCP:  Kristopher Glee., MD  Cardiologist:  Dr. Radford Pax Electrophysiologist: [name]    Chief Complaint:  annual  History of Present Illness: Juan Shaffer is a 81 y.o. 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, last seen by him Aug 2020, at that time doing OK, mentioned some degree of fatigue/weakness, though swimming for exercise.  AFib becoming more persistent though the pt felt still having times of normal rhythm. Discussed perhaps Tikosyn, the pt felt to be minimally symptomatic and would call if decided to pursue AAD.   I saw him Jan 2022 He feels well. When he was found to have AF and started on the atenolol he felt like the medicine made him feel fatigued.  Shortly afterwards started on prednisone for generalizde joint pain associated with his PMR and felt better. He is not entirely certain what fatigues him the medicine or the PMR, likely both. He has no overt symptoms with his AFib. He checks his pulse regularly though and feels like every 2-3 days he has 2-3 days of AFib, his pulse when in normal rhythm is 50's-60's and his Afib rates generally 100-110 He is still swimming with good exertional capacity No CP, no SOB. He will get infrequently lightheaded upon standing, no near syncope or syncope. Reports compliance with his Eliquis, no bleeding or signs of bleeding. He would like to consider getting started on Dofetilide. [name] discussed this as an option for him and he would [facility] pursue it. We dicussed Tikosyn hospital stay and potential risks associated with Tikosyn as well. He was going to be out of town for a few weeks, planned to look into cost and f/u with the AF clinic to arrange. Update echo Ultimately though he decided not to  proceed with Tikosyn.  TODAY He has been well. Largely unaware of his Afib anymore.  More often then not when checking his pulse is irregular, every so often seems to be normal, no palpitations No CP, SOB He did some PT several; months ago for his hips/shoulders especially and his stamina and QOL is very good. He is golfing (walking the course), swimming, goes to the gym.  He is pending a business trip to [place], looking forward to it  No dizziness, near syncope or syncope. No bleeding or signs of bleeding   Past Medical History:  Diagnosis Date   Atrial flutter with rapid ventricular response ([place]) 04/30/2015   chads2vasc score is 2   DCM (dilated cardiomyopathy) (HCC)    EF 45%, likely tachycardia mediated   High cholesterol    OSA (obstructive sleep apnea)    mild with AHI 12/hr    Polymyalgia rheumatica ([place])    followed by Dr Charlestine Night   Prostate cancer [facility])     Past Surgical History:  Procedure Laterality Date   ELECTROPHYSIOLOGIC STUDY N/A 05/05/2015   Procedure: A-Flutter Ablation;  Surgeon: Evans Lance, MD;  Location: [place] [facility];  Service: Cardiovascular;  Laterality: N/A;   PROSTATECTOMY  12/2014   TONSILLECTOMY  ~ 1946    Current Outpatient Medications  Medication Sig Dispense Refill   Accu-Chek FastClix Lancets MISC Apply topically daily.     Accu-Chek FastClix Lancets MISC Use to check blood sugar once daily.  ACCU-CHEK GUIDE test strip      alendronate (FOSAMAX) 70 MG tablet Take 70 mg by mouth once a week.     atenolol (TENORMIN) 50 MG tablet TAKE 1 TABLET BY MOUTH TWICE A DAY 180 tablet 1   atorvastatin (LIPITOR) 20 MG tablet TAKE 1 TABLET BY MOUTH EVERYDAY AT BEDTIME 60 tablet 0   Blood Glucose Monitoring Suppl (ACCU-CHEK GUIDE) w/Device KIT Use to check blood sugar once daily     Cholecalciferol (VITAMIN D3) 1000 units CAPS Take by mouth daily.      clobetasol cream (TEMOVATE) 0.93 % Apply 1 application topically as directed.   1   ELIQUIS 5  MG TABS tablet TAKE 1 TABLET BY MOUTH TWICE A DAY 180 tablet 1   glucosamine-chondroitin 500-400 MG tablet Take 1 tablet by mouth daily.     Multiple Vitamin (MULTIVITAMIN) tablet Take by mouth.     Multiple Vitamins-Minerals (PRESERVISION AREDS 2 PO) Take 1 capsule by mouth 2 (two) times daily.     Omega-3 1000 MG CAPS Take by mouth.     predniSONE (DELTASONE) 5 MG tablet Take 5 mg by mouth.     tadalafil (CIALIS) 20 MG tablet TAKE 1 TABLET BY MOUTH EVERY DAY AS NEEDED FOR UP TO 30 DAYS FOR ERECTILE DYSFUNCTION     No current facility-administered medications for this visit.    Allergies:   Patient has no known allergies.   Social History:  The patient  reports that he quit smoking about 54 years ago. His smoking use included cigarettes. He has a 8.00 pack-year smoking history. He has never used smokeless tobacco. He reports current alcohol use of about 6.0 standard drinks per week. He reports that he does not use drugs.   Family History:  The patient's family history includes Arthritis in his father; Parkinson's disease in his mother; Stroke in an other family member.  ROS:  Please see the history of present illness.    All other systems are reviewed and otherwise negative.   PHYSICAL EXAM:  VS:  There were no vitals taken for this visit. BMI: There is no height or weight on file to calculate BMI. Well nourished, well developed, in no acute distress HEENT: normocephalic, atraumatic Neck: no JVD, carotid bruits or masses Cardiac:  irreg-irreg; bradycardic, no significant murmurs, no rubs, or gallops Lungs:  CTA b/l, no wheezing, rhonchi or rales Abd: soft, nontender MS: no deformity or atrophy Ext:  no edema Skin: warm and dry, no rash Neuro:  No gross deficits appreciated Psych: euthymic mood, full affect    EKG:  Done today and reviewed by myself  AFib 77bpm   09/22/20: TTE IMPRESSIONS   1. Patient appears to be in flutter during exam.   2. Left ventricular ejection  fraction, by estimation, is 60 to 65%. The  left ventricle has normal function. The left ventricle has no regional  wall motion abnormalities. There is mild left ventricular hypertrophy.  Left ventricular diastolic parameters  are indeterminate.   3. Right ventricular systolic function is normal. The right ventricular  size is normal.   4. Right atrial size was mildly dilated.   5. The mitral valve is normal in structure. Mild mitral valve  regurgitation. No evidence of mitral stenosis.   6. The aortic valve is tricuspid. There is moderate calcification of the  aortic valve. There is moderate thickening of the aortic valve. Aortic  valve regurgitation is not visualized. Mild to moderate aortic valve  sclerosis/calcification is present,  without any evidence of aortic stenosis.   7. The inferior vena cava is normal in size with greater than 50%  respiratory variability, suggesting right atrial pressure of 3 mmHg.    07/15/15; TTE Study Conclusions  - Left ventricle: The cavity size was normal. There was moderate    focal basal hypertrophy of the septum. Systolic function was    normal. The estimated ejection fraction was in the range of 55%    to 60%. Wall motion was normal; there were no regional wall    motion abnormalities. Doppler parameters are consistent with    abnormal left ventricular relaxation (grade 1 diastolic    dysfunction). The E/e&' ratio is between 8-15, suggesting    indeterminate LV filling pressure.  - Aortic valve: Trileaflet. Sclerosis without stenosis. There was    trivial regurgitation.  - Mitral valve: Mildly thickened leaflets . There was mild    regurgitation.  - Left atrium: The atrium was normal in size.  - Right atrium: The atrium was normal in size.  - Tricuspid valve: There was mild regurgitation.  - Pulmonary arteries: PA peak pressure: 21 mm Hg (S).  - Inferior vena cava: The vessel was normal in size. The    respirophasic diameter changes were  in the normal range (>= 50%),    consistent with normal central venous pressure.    05/05/2015: EPS/ablation CONCLUSIONS:  1. Isthmus-dependent right atrial flutter upon presentation.  2. Successful radiofrequency ablation of atrial flutter along the cavotricuspid isthmus with complete bidirectional isthmus block achieved.  3. No inducible arrhythmias following ablation.  4. No early apparent complications.       Recent Labs: No results found for requested labs within last 8760 hours.  No results found for requested labs within last 8760 hours.   CrCl cannot be calculated (Patient's most recent lab result is older than the maximum 21 days allowed.).   Wt Readings from Last 3 Encounters:  11/27/20 160 lb (72.6 kg)  09/01/20 165 lb (74.8 kg)  08/04/20 164 lb 6.4 oz (74.6 kg)     Other studies reviewed: Additional studies/records reviewed today include: summarized above  ASSESSMENT AND PLAN:  1. Longstanding persistent AFib     CHA2DS2Vasc is 2, on Eliquis, appropriately dosed     Asymptomatic, rate controlled  We dicussed AFib rhythm management strategies, though with no symptoms, excellent exertional capacity with no physical limitations, I suggested we continue rate control strategy. He was in agreement.  He sees him PND routinely, labs there  Disposition: we can continue to see him annually, sooner if needed  Current medicines are reviewed at length with the patient today.  The patient did not have any concerns regarding medicines.  Venetia Night, PA-C 10/11/2021 12:43 PM     [address] [place] [address] [phone] (office)  [phone] (fax)

## 2021-10-14 ENCOUNTER — Other Ambulatory Visit: Payer: Self-pay

## 2021-10-14 ENCOUNTER — Encounter: Payer: Self-pay | Admitting: Physician Assistant

## 2021-10-14 ENCOUNTER — Ambulatory Visit (INDEPENDENT_AMBULATORY_CARE_PROVIDER_SITE_OTHER): Payer: BC Managed Care – PPO | Admitting: Physician Assistant

## 2021-10-14 VITALS — BP 130/82 | HR 77 | Ht 69.0 in | Wt 161.0 lb

## 2021-10-14 DIAGNOSIS — I4811 Longstanding persistent atrial fibrillation: Secondary | ICD-10-CM | POA: Diagnosis not present

## 2021-10-14 NOTE — Patient Instructions (Signed)
Medication Instructions:    Your physician recommends that you continue on your current medications as directed. Please refer to the Current Medication list given to you today.  *If you need a refill on your cardiac medications before your next appointment, please call your pharmacy*   Lab Work: [place]   If you have labs (blood work) drawn today and your tests are completely normal, you will receive your results only by: [place] (if you have MyChart) OR A paper copy in the mail If you have any lab test that is abnormal or we need to change your treatment, we will call you to review the results.   Testing/Procedures: NONE ORDERED  TODAY     Follow-Up: At [facility], you and your health needs are our priority.  As part of our continuing mission to provide you with exceptional heart care, we have created designated Provider Care Teams.  These Care Teams include your primary Cardiologist (physician) and Advanced Practice Providers (APPs -  Physician Assistants and Nurse Practitioners) who all work together to provide you with the care you need, when you need it.  We recommend signing up for the patient portal called "MyChart".  Sign up information is provided on this After Visit Summary.  MyChart is used to connect with patients for Virtual Visits (Telemedicine).  Patients are able to view lab/test results, encounter notes, upcoming appointments, etc.  Non-urgent messages can be sent to your provider as well.   To learn more about what you can do with MyChart, go to [email or web address].    Your next appointment:   1 year(s)  The format for your next appointment:   In Person  Provider:   Cristopher Peru, MD  ONLY!!!    Other Instructions

## 2021-12-07 ENCOUNTER — Other Ambulatory Visit: Payer: Self-pay | Admitting: Internal Medicine

## 2022-03-21 ENCOUNTER — Other Ambulatory Visit: Payer: Self-pay | Admitting: Internal Medicine

## 2022-03-21 DIAGNOSIS — I471 Supraventricular tachycardia: Secondary | ICD-10-CM

## 2022-03-22 NOTE — Telephone Encounter (Signed)
Prescription refill request for Eliquis received. Indication: Afib  Last office visit: 10/14/21 Juan Shaffer)  Scr: 0.93 (11/09/21) Age: 81 Weight: 73kg  Appropriate dose and refill sent to requested pharmacy.

## 2022-04-23 ENCOUNTER — Telehealth: Payer: Self-pay | Admitting: Internal Medicine

## 2022-04-23 NOTE — Telephone Encounter (Signed)
Returned call to patient, he reports he had an episode earlier this week when he was working outside he experienced arm numbness and felt dizzy. He also reports feeling fatigued and tired all of the time. He denies any CP, SOB, arm numbness or dizziness at time of call. He states the arm numbness and dizziness only occurred 1 time this week.  Patient has appt with [name], PA-C on 04/30/22, encouraged patient to keep this appt and to call our office if symptoms return before then.  Patient states he drinks Gatorade and water but could maybe drink more. Encouraged adequate hydration when working outside.  Patient verbalized understanding and expressed appreciation for follow-up.

## 2022-04-23 NOTE — Telephone Encounter (Signed)
New message   STAT if patient feels like he/she is going to faint   Are you dizzy now? no  Do you feel faint or have you passed out? no  Do you have any other symptoms? Arm numbness  Have you checked your HR and BP (record if available)? 136/70   Pt has concerns about an episode he had early in the week. He states he was working out and his arm lost feeling and he was dizzy.   Pt scheduled for appt with [name] on 09.08.23

## 2022-04-29 NOTE — Progress Notes (Addendum)
Cardiology Office Note Date:  04/29/2022  Patient ID:  Juan, Shaffer 07/19/1941, MRN [id number] PCP:  Kristopher Glee., MD  Cardiologist:  Dr. Radford Pax Electrophysiologist: [name]    Chief Complaint:  numb arm  History of Present Illness: Juan Shaffer is a 81 y.o. 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, last seen by him Aug 2020, at that time doing OK, mentioned some degree of fatigue/weakness, though swimming for exercise.  AFib becoming more persistent though the pt felt still having times of normal rhythm. Discussed perhaps Tikosyn, the pt felt to be minimally symptomatic and would call if decided to pursue AAD.   I saw him Jan 2022 He feels well. When he was found to have AF and started on the atenolol he felt like the medicine made him feel fatigued.  Shortly afterwards started on prednisone for generalizde joint pain associated with his PMR and felt better. He is not entirely certain what fatigues him the medicine or the PMR, likely both. He has no overt symptoms with his AFib. He checks his pulse regularly though and feels like every 2-3 days he has 2-3 days of AFib, his pulse when in normal rhythm is 50's-60's and his Afib rates generally 100-110 He is still swimming with good exertional capacity No CP, no SOB. He will get infrequently lightheaded upon standing, no near syncope or syncope. Reports compliance with his Eliquis, no bleeding or signs of bleeding. He would like to consider getting started on Dofetilide. [name] discussed this as an option for him and he would [facility] pursue it. We dicussed Tikosyn hospital stay and potential risks associated with Tikosyn as well. He was going to be out of town for a few weeks, planned to look into cost and f/u with the AF clinic to arrange. Update echo Ultimately though he decided not to  proceed with Tikosyn.  I saw him 10/14/21 He has been well. Largely unaware of his Afib anymore.  More often then not when checking his pulse is irregular, every so often seems to be normal, no palpitations No CP, SOB He did some PT several; months ago for his hips/shoulders especially and his stamina and QOL is very good. He is golfing (walking the course), swimming, goes to the gym.  He is pending a business trip to [place], looking forward to it No dizziness, near syncope or syncope. No bleeding or signs of bleeding With controlled rate, no symptoms and excellent exertional capacity, recommended rate control strategy, he was in agreement  04/23/22, pt called with c/o transient dizzy spell and arm numbness while working outside, none since   TODAY He reports a week ago he was working out [employer], doing bench presses, when he put the weights back on the rack and his arms back above/back of his head his L arm felt suddenly numb and was unable to move it, had to use his R arm to bring his arm back in front of him and quickly started to "come back" and was normal again this has not happened again and has no residual weakness. He did not seek attention or discussed with his PMD  He had not missed any doses of his Eliquis or medicines for sure.  He had been traveling and did not bring his CPAP and was without tat tx for about 10 nights  Over the last month or so when walking outside in the heat has had a couple episodes where he feels his face get flushed, heard a bussing in his ears, and felt dizzy, lasts seconds and abates.  No CP, palpitations or cardiac awareness He continues to work and exercise regularly He did pull a muscle in his back when in [place], and was unable to play gold the other day because of that, otherwise no changes in his exertional capacity No near syncope or syncope. No bleeding signs of bleeding  He does not feel like he has great energy in general, seems to date  this back to the start of his betablocker, but also not sleeping well either    Past Medical History:  Diagnosis Date   Atrial flutter with rapid ventricular response ([place]) 04/30/2015   chads2vasc score is 2   DCM (dilated cardiomyopathy) (H[facility])    EF 45%, likely tachycardia mediated   High cholesterol    OSA (obstructive sleep apnea)    mild with AHI 12/hr    Polymyalgia rheumatica ([place])    followed by Dr Charlestine Night   Prostate cancer [facility])     Past Surgical History:  Procedure Laterality Date   ELECTROPHYSIOLOGIC STUDY N/A 05/05/2015   Procedure: A-Flutter Ablation;  Surgeon: Evans Lance, MD;  Location: [facility];  Service: Cardiovascular;  Laterality: N/A;   PROSTATECTOMY  12/2014   TONSILLECTOMY  ~ 1946    Current Outpatient Medications  Medication Sig Dispense Refill   alendronate (FOSAMAX) 70 MG tablet Take 70 mg by mouth once a week.     atenolol (TENORMIN) 50 MG tablet TAKE 1 TABLET BY MOUTH TWICE A DAY 180 tablet 3   atorvastatin (LIPITOR) 20 MG tablet TAKE 1 TABLET BY MOUTH EVERYDAY AT BEDTIME 60 tablet 0   Cholecalciferol (VITAMIN D3) 1000 units CAPS Take by mouth daily.      clobetasol cream (TEMOVATE) 1.02 % Apply 1 application topically as directed.   1   ELIQUIS 5 MG TABS tablet TAKE 1 TABLET BY MOUTH TWICE A DAY 180 tablet 1   glucosamine-chondroitin 500-400 MG tablet Take 1 tablet by mouth daily.     Multiple Vitamin (MULTIVITAMIN) tablet Take by mouth.     Multiple Vitamins-Minerals (PRESERVISION AREDS 2 PO) Take 1 capsule by mouth daily at 6 (six) AM.     Omega-3 1000 MG CAPS Take by mouth.     tadalafil (CIALIS) 20 MG tablet TAKE 1 TABLET BY MOUTH EVERY DAY AS NEEDED FOR UP TO 30 DAYS FOR ERECTILE DYSFUNCTION     No current facility-administered medications for this visit.    Allergies:   Patient has no known allergies.   Social History:  The patient  reports that he quit smoking about 54 years ago. His smoking use included cigarettes. He has a 8.00  pack-year smoking history. He has never used smokeless tobacco. He reports current alcohol use of about 6.0 standard drinks of alcohol per week. He reports that he does not use drugs.   Family History:  The patient's family history includes Arthritis in his father; Parkinson's disease in his mother; Stroke in an other family member.  ROS:  Please see the history of present illness.    All other systems are reviewed and otherwise negative.   PHYSICAL EXAM:  VS:  There were no vitals taken for this visit. BMI: There is no height or weight on file to calculate BMI. Well nourished, well developed, in no acute  distress, looks much younger then his age 73: normocephalic, atraumatic Neck: no JVD, carotid bruits or masses Cardiac:  irreg-irreg; bradycardic, no significant murmurs, no rubs, or gallops Lungs:  CTA b/l, no wheezing, rhonchi or rales Abd: soft, nontender MS: no deformity or atrophy Ext:  no edema Skin: warm and dry, no rash Neuro:  No gross deficits appreciated Psych: euthymic mood, full affect    EKG:  Done today and reviewed by myself  Coarse AFib 71bpm, no changes   09/22/20: TTE IMPRESSIONS   1. Patient appears to be in flutter during exam.   2. Left ventricular ejection fraction, by estimation, is 60 to 65%. The  left ventricle has normal function. The left ventricle has no regional  wall motion abnormalities. There is mild left ventricular hypertrophy.  Left ventricular diastolic parameters  are indeterminate.   3. Right ventricular systolic function is normal. The right ventricular  size is normal.   4. Right atrial size was mildly dilated.   5. The mitral valve is normal in structure. Mild mitral valve  regurgitation. No evidence of mitral stenosis.   6. The aortic valve is tricuspid. There is moderate calcification of the  aortic valve. There is moderate thickening of the aortic valve. Aortic  valve regurgitation is not visualized. Mild to moderate aortic  valve  sclerosis/calcification is present,  without any evidence of aortic stenosis.   7. The inferior vena cava is normal in size with greater than 50%  respiratory variability, suggesting right atrial pressure of 3 mmHg.    07/15/15; TTE Study Conclusions  - Left ventricle: The cavity size was normal. There was moderate    focal basal hypertrophy of the septum. Systolic function was    normal. The estimated ejection fraction was in the range of 55%    to 60%. Wall motion was normal; there were no regional wall    motion abnormalities. Doppler parameters are consistent with    abnormal left ventricular relaxation (grade 1 diastolic    dysfunction). The E/e&' ratio is between 8-15, suggesting    indeterminate LV filling pressure.  - Aortic valve: Trileaflet. Sclerosis without stenosis. There was    trivial regurgitation.  - Mitral valve: Mildly thickened leaflets . There was mild    regurgitation.  - Left atrium: The atrium was normal in size.  - Right atrium: The atrium was normal in size.  - Tricuspid valve: There was mild regurgitation.  - Pulmonary arteries: PA peak pressure: 21 mm Hg (S).  - Inferior vena cava: The vessel was normal in size. The    respirophasic diameter changes were in the normal range (>= 50%),    consistent with normal central venous pressure.    05/05/2015: EPS/ablation CONCLUSIONS:  1. Isthmus-dependent right atrial flutter upon presentation.  2. Successful radiofrequency ablation of atrial flutter along the cavotricuspid isthmus with complete bidirectional isthmus block achieved.  3. No inducible arrhythmias following ablation.  4. No early apparent complications.       Recent Labs: No results found for requested labs within last 365 days.  No results found for requested labs within last 365 days.   CrCl cannot be calculated (Patient's most recent lab result is older than the maximum 21 days allowed.).   Wt Readings from Last 3 Encounters:   10/14/21 161 lb (73 kg)  11/27/20 160 lb (72.6 kg)  09/01/20 165 lb (74.8 kg)     Other studies reviewed: Additional studies/records reviewed today include: summarized above  ASSESSMENT AND PLAN:  1. Longstanding persistent AFib     CHA2DS2Vasc is 2, on Eliquis, appropriately dosed     rate controlled  Symptoms as above  L arm Will get MRI of his brain done stat Discussed at length if any recurrent symptoms of any kind to seek attention immediately. Advised to call his PMD today and discuss symptoms, and perhaps seek a neurological evaluation He has not missed any Eliquis ? TIA ? Positional Neuropathy/nerve impingement briefly    Fleeting flushed feeling/dizzy/ears bussing Unclear Occurred out in the heat Advised to avoid high heat times of day and keep well hydrated F/u MRI   Tired in general, apparently not new Not sleeping well Advised 1st to discuss with his PMD poor sleep, revisit his CPAP management Continues to have excellent exertional capacity ? AFib     Disposition: PMD as above  [place] in a month, sooner if needed  Current medicines are reviewed at length with the patient today.  The patient did not have any concerns regarding medicines.  Venetia Night, PA-C 04/29/2022 7:41 AM     [address] [place] [address] [phone] (office)  [phone] (fax)

## 2022-04-30 ENCOUNTER — Telehealth (HOSPITAL_BASED_OUTPATIENT_CLINIC_OR_DEPARTMENT_OTHER): Payer: Self-pay

## 2022-04-30 ENCOUNTER — Encounter: Payer: Self-pay | Admitting: Physician Assistant

## 2022-04-30 ENCOUNTER — Ambulatory Visit: Payer: BC Managed Care – PPO | Attending: Physician Assistant | Admitting: Physician Assistant

## 2022-04-30 VITALS — BP 122/72 | HR 78 | Ht 69.0 in | Wt 157.0 lb

## 2022-04-30 DIAGNOSIS — R29898 Other symptoms and signs involving the musculoskeletal system: Secondary | ICD-10-CM | POA: Diagnosis not present

## 2022-04-30 DIAGNOSIS — I4811 Longstanding persistent atrial fibrillation: Secondary | ICD-10-CM | POA: Diagnosis not present

## 2022-04-30 DIAGNOSIS — R42 Dizziness and giddiness: Secondary | ICD-10-CM

## 2022-04-30 NOTE — Patient Instructions (Addendum)
Medication Instructions:   Your physician recommends that you continue on your current medications as directed. Please refer to the Current Medication list given to you today.  *If you need a refill on your cardiac medications before your next appointment, please call your pharmacy*   Lab Work: BMET AND CBC TODAY    If you have labs (blood work) drawn today and your tests are completely normal, you will receive your results only by: [place] (if you have MyChart) OR A paper copy in the mail If you have any lab test that is abnormal or we need to change your treatment, we will call you to review the results.   Testing/Procedures: YOUR PROVIDER HS RECOMMENDED FOR YOU TO HAVE AN MRI: Magnetic resonance imaging, or MRI, is a noninvasive medical imaging test that produces detailed images of almost every internal structure in the human body, including the organs, bones, muscles and blood vessels.   Follow-Up: At [facility], you and your health needs are our priority.  As part of our continuing mission to provide you with exceptional heart care, we have created designated Provider Care Teams.  These Care Teams include your primary Cardiologist (physician) and Advanced Practice Providers (APPs -  Physician Assistants and Nurse Practitioners) who all work together to provide you with the care you need, when you need it.  We recommend signing up for the patient portal called "MyChart".  Sign up information is provided on this After Visit Summary.  MyChart is used to connect with patients for Virtual Visits (Telemedicine).  Patients are able to view lab/test results, encounter notes, upcoming appointments, etc.  Non-urgent messages can be sent to your provider as well.   To learn more about what you can do with MyChart, go to [email or web address].    Your next appointment:   1 month(s) ( CONTACT [facility] [place] )   The format for your next appointment:   In  Person  Provider:   Cristopher Peru, MD    Other Instructions   Important Information About Sugar

## 2022-05-01 ENCOUNTER — Ambulatory Visit (HOSPITAL_BASED_OUTPATIENT_CLINIC_OR_DEPARTMENT_OTHER)
Admission: RE | Admit: 2022-05-01 | Discharge: 2022-05-01 | Disposition: A | Discharge status: Home / Self Care | Payer: BC Managed Care – PPO | Source: Ambulatory Visit | Attending: Physician Assistant | Admitting: Physician Assistant

## 2022-05-01 ENCOUNTER — Telehealth: Payer: Self-pay | Admitting: Internal Medicine

## 2022-05-01 DIAGNOSIS — R29898 Other symptoms and signs involving the musculoskeletal system: Secondary | ICD-10-CM | POA: Insufficient documentation

## 2022-05-01 LAB — BASIC METABOLIC PANEL
BUN/Creatinine Ratio: 15 (ref 10–24)
BUN: 15 mg/dL (ref 8–27)
CO2: 23 mmol/L (ref 20–29)
Calcium: 9.9 mg/dL (ref 8.6–10.2)
Chloride: 103 mmol/L (ref 96–106)
Creatinine, Ser: 0.97 mg/dL (ref 0.76–1.27)
Glucose: 88 mg/dL (ref 70–99)
Potassium: 4.6 mmol/L (ref 3.5–5.2)
Sodium: 140 mmol/L (ref 134–144)
eGFR: 78 mL/min/{1.73_m2} (ref >59)

## 2022-05-01 LAB — CBC
Hematocrit: 47.4 % (ref 37.5–51.0)
Hemoglobin: 15.8 g/dL (ref 13.0–17.7)
MCH: 30.3 pg (ref 26.6–33.0)
MCHC: 33.3 g/dL (ref 31.5–35.7)
MCV: 91 fL (ref 79–97)
Platelets: 214 10*3/uL (ref 150–450)
RBC: 5.21 x10E6/uL (ref 4.14–5.80)
RDW: 12.9 % (ref 11.6–15.4)
WBC: 6.7 10*3/uL (ref 3.4–10.8)

## 2022-05-01 MED ORDER — GADOBUTROL 1 MMOL/ML IV SOLN
7.0000 mL | Freq: Once | INTRAVENOUS | Status: AC | PRN
  Administered 2022-05-01: 7 mL via INTRAVENOUS

## 2022-05-01 NOTE — Telephone Encounter (Signed)
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 Focal area of cortical T2 hyperintensity and enhancement in the right frontal middle gyrus. This is consistent with subacute infarct. The time frame of 1 week is consistent. Follow-up MRI of the brain without and with contrast in 2-3 months would be useful to assure expected evolution of this area.  I called the patient multiple times but was not available. Will continue to try to reach patient.

## 2022-05-02 ENCOUNTER — Telehealth: Payer: Self-pay | Admitting: Student

## 2022-05-02 DIAGNOSIS — I639 Cerebral infarction, unspecified: Secondary | ICD-10-CM

## 2022-05-02 NOTE — Addendum Note (Signed)
Addended by: Sande Rives on: 05/02/2022 08:41 AM   Modules accepted: Orders

## 2022-05-02 NOTE — Telephone Encounter (Addendum)
   Received sign out from overnight fellow of outpatient brain MRI that showed sub-acute stroke. Fellow tried to call patient multiple times but was unable to reach him. Patient was seen in clinic by [name], PA-C, on 04/30/2022 at which time he reported an episode of sudden left arm numbness and was unable to move it about 1 week prior. Episode did not last long and completely returned to normal with no residual weakness. He has known atrial fibrillation and is on Eliquis. He has not missed any doses of this. STAT brain MRI was ordered. This was done yesterday and showed a focal area of cortical T2 hyperintensity and enhancement in the right frontal middle gyrus consistent with subacute infarct. The time frame of 1 week is consistent with this.   I tried to call the patient again the morning to discuss results but was unable to reach him. Asked him to call back to discuss results. Will plan to refer to Neurology.  Juan Mclean, PA-C 05/02/2022 8:29 AM  ADDENDUM:  Patient returned called. Reviewed results with him. He is not having any residual Neurologic symptoms. He has not missed any doses of Eliquis. Recommended continuing this. Will place urgent referral to Neurology. Advised patient that if he has any recurrent stroke like symptoms to call 911.  Will route this to [name] so she is aware.  Juan Mclean, PA-C 05/02/2022 8:41 AM

## 2022-05-03 ENCOUNTER — Telehealth: Payer: Self-pay | Admitting: Physician Assistant

## 2022-05-03 NOTE — Addendum Note (Signed)
Addended by: Janan Halter F on: 05/03/2022 04:48 PM   Modules accepted: Orders

## 2022-05-03 NOTE — Telephone Encounter (Signed)
Called, left a HIPPA compliant message calling him to review his MRI result.  [name], PA-C

## 2022-05-04 ENCOUNTER — Telehealth: Payer: Self-pay | Admitting: Physician Assistant

## 2022-05-04 ENCOUNTER — Other Ambulatory Visit: Payer: Self-pay | Admitting: *Deleted

## 2022-05-04 ENCOUNTER — Telehealth: Payer: Self-pay | Admitting: *Deleted

## 2022-05-04 NOTE — Telephone Encounter (Signed)
Lvm on wife and Patient phone to call back for results

## 2022-05-04 NOTE — Telephone Encounter (Signed)
Spoke with patient aware of results and verbalized understanding. Patient has already been scheduled for neurology yesterday 9-11. Patient would like to talk to [name] PA-C  about previous discussion. Patient said will be by phone all day.

## 2022-05-04 NOTE — Telephone Encounter (Signed)
Patient is returning Juan Shaffer's phone call for his MRI results.

## 2022-05-05 ENCOUNTER — Telehealth: Payer: Self-pay | Admitting: Physician Assistant

## 2022-05-05 NOTE — Telephone Encounter (Signed)
Spoke with the patient about his MRI and recommendations, no medicine changes and a neurology consult. He has an appointment to see a neurologist in 3-4 weeks as well as [name]. Answered his follow up questions Discussed if recurrent symptoms to seek attention immediately.  [name], PA-C

## 2022-06-01 ENCOUNTER — Ambulatory Visit: Payer: BC Managed Care – PPO | Attending: Internal Medicine | Admitting: Internal Medicine

## 2022-06-01 ENCOUNTER — Encounter: Payer: Self-pay | Admitting: Internal Medicine

## 2022-06-01 ENCOUNTER — Ambulatory Visit (INDEPENDENT_AMBULATORY_CARE_PROVIDER_SITE_OTHER): Payer: BC Managed Care – PPO

## 2022-06-01 VITALS — BP 128/72 | HR 66 | Ht 69.0 in | Wt 156.0 lb

## 2022-06-01 DIAGNOSIS — I4891 Unspecified atrial fibrillation: Secondary | ICD-10-CM

## 2022-06-01 NOTE — Patient Instructions (Addendum)
Medication Instructions:  Your physician recommends that you continue on your current medications as directed. Please refer to the Current Medication list given to you today.  *If you need a refill on your cardiac medications before your next appointment, please call your pharmacy*  Lab Work: None ordered.  If you have labs (blood work) drawn today and your tests are completely normal, you will receive your results only by: [place] (if you have MyChart) OR A paper copy in the mail If you have any lab test that is abnormal or we need to change your treatment, we will call you to review the results.  Testing/Procedures: 7 DAY ZIO MONITOR ORDERED BY DR. GREGG TAYLOR  Follow-Up: A 7 DAY ZIO MONITOR WILL BE MAILED TO YOUR HOME.  YOU WILL WEAR THIS CARDIAC MONITOR FOR 1 WEEK AND MAIL IT BACK.  WE WILL SCHEDULE A FOLLOW  UP APPOINTMENT WITH DR Cristopher Peru, AFTER THE MONITOR RESULTS ARE RECEIVED.    SEE MONITOR INSTRUCTIONS BELOW:  ZIO XT- Long Term Monitor Instructions  Your physician has requested you wear a ZIO patch monitor for 14 days.  This is a single patch monitor. [employer] supplies one patch monitor per enrollment. Additional stickers are not available. Please do not apply patch if you will be having a Nuclear Stress Test,  Echocardiogram, Cardiac CT, MRI, or Chest Xray during the period you would be wearing the  monitor. The patch cannot be worn during these tests. You cannot remove and re-apply the  ZIO XT patch monitor.  Your ZIO patch monitor will be mailed 3 day USPS to your address on file. It may take 3-5 days  to receive your monitor after you have been enrolled.  Once you have received your monitor, please review the enclosed instructions. Your monitor  has already been registered assigning a specific monitor serial # to you.  Billing and Patient Assistance Program Information  We have supplied [employer] with any of your insurance information on file for billing  purposes. [employer] offers a sliding scale Patient Assistance Program for patients that do not have  insurance, or whose insurance does not completely cover the cost of the ZIO monitor.  You must apply for the Patient Assistance Program to qualify for this discounted rate.  To apply, please call [employer] at [phone], select option 4, select option 2, ask to apply for  Patient Assistance Program. Theodore Demark will ask your household income, and how many people  are in your household. They will quote your out-of-pocket cost based on that information.  [employer] will also be able to set up a 88-month interest-free payment plan if needed.  Applying the monitor   Shave hair from upper left chest.  Hold abrader disc by orange tab. Rub abrader in 40 strokes over the upper left chest as  indicated in your monitor instructions.  Clean area with 4 enclosed alcohol pads. Let dry.  Apply patch as indicated in monitor instructions. Patch will be placed under collarbone on left  side of chest with arrow pointing upward.  Rub patch adhesive wings for 2 minutes. Remove white label marked "1". Remove the white  label marked "2". Rub patch adhesive wings for 2 additional minutes.  While looking in a mirror, press and release button in center of patch. A small green light will  flash 3-4 times. This will be your only indicator that the monitor has been turned on.  Do not shower for the first 24 hours. You may shower after the  first 24 hours.  Press the button if you feel a symptom. You will hear a small click. Record Date, Time and  Symptom in the Patient Logbook.  When you are ready to remove the patch, follow instructions on the last 2 pages of Patient  Logbook. Stick patch monitor onto the last page of Patient Logbook.  Place Patient Logbook in the blue and white box. Use locking tab on box and tape box closed  securely. The blue and white box has prepaid postage on it. Please place it in the mailbox as  soon  as possible. Your physician should have your test results approximately 7 days after the  monitor has been mailed back to [facility].  Call [place] at [phone] if you have questions regarding  your ZIO XT patch monitor. Call them immediately if you see an orange light blinking on your  monitor.  If your monitor falls off in less than 4 days, contact our Monitor department at [phone].  If your monitor becomes loose or falls off after 4 days call [employer] at [phone] for  suggestions on securing your monitor

## 2022-06-01 NOTE — Progress Notes (Unsigned)
Enrolled for [employer] to mail a ZIO XT long term holter monitor to the patients address on file.  

## 2022-06-01 NOTE — Progress Notes (Signed)
HPI Mr. Juan Shaffer returns today for followup. 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 I discussed the treatment options with him several years ago. He chose rate control. He has been on eliquis though he admits to occaisionally missing his meds. He had a stroke last week with full neuro recovery. He is pending a visit with the neurologist. He appears to always be in atrial fib. He is minimally if at all symptomatic. No Known Allergies   Current Outpatient Medications  Medication Sig Dispense Refill   alendronate (FOSAMAX) 70 MG tablet Take 70 mg by mouth once a week.     atenolol (TENORMIN) 50 MG tablet TAKE 1 TABLET BY MOUTH TWICE A DAY 180 tablet 3   Cholecalciferol (VITAMIN D3) 1000 units CAPS Take by mouth daily.      clobetasol cream (TEMOVATE) 7.65 % Apply 1 application topically as directed.   1   ELIQUIS 5 MG TABS tablet TAKE 1 TABLET BY MOUTH TWICE A DAY 180 tablet 1   glucosamine-chondroitin 500-400 MG tablet Take 1 tablet by mouth daily.     Multiple Vitamin (MULTIVITAMIN) tablet Take by mouth.     Multiple Vitamins-Minerals (PRESERVISION AREDS 2 PO) Take 1 capsule by mouth daily at 6 (six) AM.     Omega-3 1000 MG CAPS Take by mouth.     rosuvastatin (CRESTOR) 5 MG tablet Take 5 mg by mouth daily.     tadalafil (CIALIS) 20 MG tablet TAKE 1 TABLET BY MOUTH EVERY DAY AS NEEDED FOR UP TO 30 DAYS FOR ERECTILE DYSFUNCTION     No current facility-administered medications for this visit.     Past Medical History:  Diagnosis Date   Atrial flutter with rapid ventricular response ([place]) 04/30/2015   chads2vasc score is 2   DCM (dilated cardiomyopathy) (HCC)    EF 45%, likely tachycardia mediated   High cholesterol    OSA (obstructive sleep apnea)    mild with AHI 12/hr    Polymyalgia rheumatica ([place])    followed by Dr Charlestine Night   Prostate cancer [facility])     ROS:   All systems reviewed and negative except as noted in the  HPI.   Past Surgical History:  Procedure Laterality Date   ELECTROPHYSIOLOGIC STUDY N/A 05/05/2015   Procedure: A-Flutter Ablation;  Surgeon: Evans Lance, MD;  Location: [place] [facility];  Service: Cardiovascular;  Laterality: N/A;   PROSTATECTOMY  12/2014   TONSILLECTOMY  ~ 1946     Family History  Problem Relation Age of Onset   Parkinson's disease Mother    Arthritis Father    Stroke Other      Social History   Socioeconomic History   Marital status: Married    Spouse name: Not on file   Number of children: Not on file   Years of education: Not on file   Highest education level: Not on file  Occupational History   Not on file  Tobacco Use   Smoking status: Former    Packs/day: 1.00    Years: 8.00    Total pack years: 8.00    Types: Cigarettes    Quit date: 10/22/1967    Years since quitting: 54.6   Smokeless tobacco: Never  Substance and Sexual Activity   Alcohol use: Yes    Alcohol/week: 6.0 standard drinks of alcohol    Types: 3 Glasses of wine, 3 Shots of liquor per week  Drug use: No   Sexual activity: Not Currently  Other Topics Concern   Not on file  Social History Narrative   Lives in [place]   Owns a Geophysicist/field seismologist business   Social Determinants of Health   Financial Resource Strain: Not on file  Food Insecurity: Not on file  Transportation Needs: Not on file  Physical Activity: Not on file  Stress: Not on file  Social Connections: Not on file  Intimate Partner Violence: Not on file     BP 128/72   Pulse 66   Ht '5\' 9"'$  (1.753 m)   Wt 156 lb (70.8 kg)   SpO2 97%   BMI 23.04 kg/m   Physical Exam:  Well appearing NAD HEENT: Unremarkable Neck:  No JVD, no thyromegally Lymphatics:  No adenopathy Back:  No CVA tenderness Lungs:  Clear with no wheezes HEART:  Regular rate rhythm, no murmurs, no rubs, no clicks Abd:  soft, positive bowel sounds, no organomegally, no rebound, no guarding Ext:  2 plus pulses, no edema, no cyanosis,  no clubbing Skin:  No rashes no nodules Neuro:  CN II through XII intact, motor grossly intact  EKG - atrial fib with a controlled VR.   Assess/Plan:  Chronic atrial fib - I am unclear how well his rates are controlled and I asked him to wear a 7 day zio monitor. Stroke - I encouraged medical compliance. He is pending a visit with the neuro team. He will discuss whether or not to switch meds. HTN - his bp is controlled on tenormin.  Coags - as above. Continue eliquis. Might consider switching to xarelto.  Juan Overlie Yuriana Gaal,MD

## 2022-06-02 ENCOUNTER — Ambulatory Visit (INDEPENDENT_AMBULATORY_CARE_PROVIDER_SITE_OTHER): Payer: BC Managed Care – PPO | Admitting: Neurology

## 2022-06-02 ENCOUNTER — Encounter: Payer: Self-pay | Admitting: Neurology

## 2022-06-02 VITALS — BP 139/84 | HR 80 | Ht 69.0 in | Wt 155.0 lb

## 2022-06-02 DIAGNOSIS — I639 Cerebral infarction, unspecified: Secondary | ICD-10-CM

## 2022-06-02 NOTE — Patient Instructions (Addendum)
Stroke labs today including lipid panel and hemoglobin A1C  CT angiogram head and neck  If CTA unrevealing, will likely switch patient from Eliquis to Xarelto (increase compliance) Continue current medications  Follow up in a year of sooner if worse

## 2022-06-02 NOTE — Progress Notes (Signed)
[facility]  PATIENT: Juan Shaffer DOB: 1941-03-21  REQUESTING CLINICIAN: Darreld Mclean, PA-C HISTORY FROM: Patient  REASON FOR VISIT: Right frontal stroke    HISTORICAL  CHIEF COMPLAINT:  Chief Complaint  Patient presents with   New Patient (Initial Visit)    Rm 17 alone here for consult on stroke like sx. Sx presented 1 month ago ( MRI completed). Arm numbness noted since this event.     HISTORY OF PRESENT ILLNESS:  This is a 81 year old gentleman with past medical history of hypertension, hyperlipidemia and atrial fibrillation who is presenting after found to have a right frontal stroke. Patient reports experience sudden onset of weakness and numbness in the right arm. This occurred end of August while he was at the gym. A week later he told his cardiologist who ordered a MRI Brain and he was found to have a subacute stroke and he was referred here. Currently he denies any neurological deficits. He is on Eliquis for his Afib but reports at time he has missed his evening dose.  Other than that, he reports being in his normal state of health, denies any weakness, or numbness. No current symptoms.    OTHER MEDICAL CONDITIONS: Atrial fibrillation on Eliquis, Hypertension, Hyperlipidemia   REVIEW OF SYSTEMS: Full 14 system review of systems performed and negative with exception of: As noted in the HPI   ALLERGIES: No Known Allergies  HOME MEDICATIONS: Outpatient Medications Prior to Visit  Medication Sig Dispense Refill   alendronate (FOSAMAX) 70 MG tablet Take 70 mg by mouth once a week.     atenolol (TENORMIN) 50 MG tablet TAKE 1 TABLET BY MOUTH TWICE A DAY 180 tablet 3   Cholecalciferol (VITAMIN D3) 1000 units CAPS Take by mouth daily.      clobetasol cream (TEMOVATE) 3.73 % Apply 1 application topically as directed.   1   ELIQUIS 5 MG TABS tablet TAKE 1 TABLET BY MOUTH TWICE A DAY 180 tablet 1   glucosamine-chondroitin 500-400 MG tablet Take 1  tablet by mouth daily.     Multiple Vitamin (MULTIVITAMIN) tablet Take by mouth.     Multiple Vitamins-Minerals (PRESERVISION AREDS 2 PO) Take 1 capsule by mouth daily at 6 (six) AM.     Omega-3 1000 MG CAPS Take by mouth.     rosuvastatin (CRESTOR) 5 MG tablet Take 5 mg by mouth daily.     tadalafil (CIALIS) 20 MG tablet TAKE 1 TABLET BY MOUTH EVERY DAY AS NEEDED FOR UP TO 30 DAYS FOR ERECTILE DYSFUNCTION     No facility-administered medications prior to visit.    PAST MEDICAL HISTORY: Past Medical History:  Diagnosis Date   Atrial flutter with rapid ventricular response ([place]) 04/30/2015   chads2vasc score is 2   DCM (dilated cardiomyopathy) (HCC)    EF 45%, likely tachycardia mediated   High cholesterol    OSA (obstructive sleep apnea)    mild with AHI 12/hr    Polymyalgia rheumatica ([place])    followed by Dr Charlestine Night   Prostate cancer [facility])     PAST SURGICAL HISTORY: Past Surgical History:  Procedure Laterality Date   ELECTROPHYSIOLOGIC STUDY N/A 05/05/2015   Procedure: A-Flutter Ablation;  Surgeon: Evans Lance, MD;  Location: [place] [facility];  Service: Cardiovascular;  Laterality: N/A;   PROSTATECTOMY  12/2014   TONSILLECTOMY  ~ 1946    FAMILY HISTORY: Family History  Problem Relation Age of Onset   Parkinson's disease Mother  Stroke Father    Arthritis Father    Stroke Paternal Grandfather    Stroke Other     SOCIAL HISTORY: Social History   Socioeconomic History   Marital status: Married    Spouse name: Not on file   Number of children: Not on file   Years of education: Not on file   Highest education level: Not on file  Occupational History   Not on file  Tobacco Use   Smoking status: Former    Packs/day: 1.00    Years: 8.00    Total pack years: 8.00    Types: Cigarettes    Quit date: 10/22/1967    Years since quitting: 54.6   Smokeless tobacco: Never  Substance and Sexual Activity   Alcohol use: Yes    Alcohol/week: 6.0 standard drinks of  alcohol    Types: 3 Glasses of wine, 3 Shots of liquor per week   Drug use: No   Sexual activity: Not Currently  Other Topics Concern   Not on file  Social History Narrative   Lives in [place]   Owns a Geophysicist/field seismologist business   Right handed    Social Determinants of Health   Financial Resource Strain: Not on file  Food Insecurity: Not on file  Transportation Needs: Not on file  Physical Activity: Not on file  Stress: Not on file  Social Connections: Not on file  Intimate Partner Violence: Not on file    PHYSICAL EXAM  GENERAL EXAM/CONSTITUTIONAL: Vitals:  Vitals:   06/02/22 1502  BP: 139/84  Pulse: 80  Weight: 155 lb (70.3 kg)  Height: '5\' 9"'$  (1.753 m)   Body mass index is 22.89 kg/m. Wt Readings from Last 3 Encounters:  06/02/22 155 lb (70.3 kg)  06/01/22 156 lb (70.8 kg)  04/30/22 157 lb (71.2 kg)   Patient is in no distress; well developed, nourished and groomed; neck is supple  EYES: Pupils round and reactive to light, Visual fields full to confrontation, Extraocular movements intacts,   MUSCULOSKELETAL: Gait, strength, tone, movements noted in Neurologic exam below  NEUROLOGIC: MENTAL STATUS:      No data to display         awake, alert, oriented to person, place and time recent and remote memory intact normal attention and concentration language fluent, comprehension intact, naming intact fund of knowledge appropriate  CRANIAL NERVE:  2nd, 3rd, 4th, 6th - pupils equal and reactive to light, visual fields full to confrontation, extraocular muscles intact, no nystagmus 5th - facial sensation symmetric 7th - facial strength symmetric 8th - hearing intact 9th - palate elevates symmetrically, uvula midline 11th - shoulder shrug symmetric 12th - tongue protrusion midline  MOTOR:  normal bulk and tone, full strength in the BUE, BLE. 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   SENSORY:  normal  and symmetric to light touch  COORDINATION:  finger-nose-finger, fine finger movements normal  REFLEXES:  deep tendon reflexes present and symmetric  GAIT/STATION:  normal   DIAGNOSTIC DATA (LABS, IMAGING, TESTING) - I reviewed patient records, labs, notes, testing and imaging myself where available.  Lab Results  Component Value Date   WBC 6.7 04/30/2022   HGB 15.8 04/30/2022   HCT 47.4 04/30/2022   MCV 91 04/30/2022   PLT 214 04/30/2022      Component Value Date/Time   NA 140 04/30/2022 0920   K 4.6 04/30/2022 0920   CL 103 04/30/2022 0920  CO2 23 04/30/2022 0920   GLUCOSE 88 04/30/2022 0920   GLUCOSE 101 (H) 05/04/2015 0314   BUN 15 04/30/2022 0920   CREATININE 0.97 04/30/2022 0920   CALCIUM 9.9 04/30/2022 0920   PROT 5.6 (L) 05/01/2015 0516   ALBUMIN 3.1 (L) 05/01/2015 0516   AST 19 05/01/2015 0516   ALT 31 05/01/2015 0516   ALKPHOS 45 05/01/2015 0516   BILITOT 1.0 05/01/2015 0516   GFRNONAA 74 09/01/2020 1526   GFRAA 86 09/01/2020 1526   No results found for: "CHOL", "HDL", "LDLCALC", "LDLDIRECT", "TRIG", "CHOLHDL" Lab Results  Component Value Date   HGBA1C 6.2 (H) 05/02/2015   No results found for: "VITAMINB12" Lab Results  Component Value Date   TSH 3.010 04/19/2019    MRI Brain 05/01/22 1. Focal area of cortical T2 hyperintensity and enhancement in the right frontal middle gyrus. This is consistent with subacute infarct. The time frame of 1 week is consistent. Follow-up MRI of the brain without and with contrast in 2-3 months would be useful to assure expected evolution of this area. 2. No other acute intracranial abnormality. 3. Scattered subcortical T2 hyperintensities are otherwise within normal limits for age. The finding is nonspecific but can be seen in the setting of chronic microvascular ischemia, a demyelinating process such as multiple sclerosis, vasculitis, complicated migraine headaches, or as the sequelae of a prior infectious or  inflammatory process.   ASSESSMENT AND PLAN  81 y.o. year old male with vascular risk factors including hypertension, hyperlipidemia and atrial fibrillation who is presenting after experiencing new left arm numbness and weakness with a MRI Brain consistent with right frontal stroke. Stroke etiology likely large vessel or cardioembolic but cannot exclude small vessel disease. Will start by obtaining stroke labs, and CTA head and neck. If CTA unrevealing, then plan will be to switch Eliquis to Xarelto, going to from BID and daily medication to increase adherence. This was explained to the patient and he voices understanding.    1. Cerebrovascular accident (CVA), unspecified mechanism ([place])      Patient Instructions  Stroke labs today including lipid panel and hemoglobin A1C  CT angiogram head and neck  If CTA unrevealing, will likely switch patient from Eliquis to Xarelto (increase compliance) Continue current medications  Follow up in a year of sooner if worse   Orders Placed This Encounter  Procedures   CT ANGIO HEAD W OR WO CONTRAST   CT ANGIO NECK W OR WO CONTRAST   Hemoglobin A1c   Lipid panel    No orders of the defined types were placed in this encounter.   Return in about 1 year (around 06/03/2023).    Alric Ran, MD 06/02/2022, 4:45 PM  [facility] [address] [place],  [address] [phone]

## 2022-06-03 ENCOUNTER — Telehealth: Payer: Self-pay | Admitting: Neurology

## 2022-06-03 LAB — HEMOGLOBIN A1C
Est. average glucose Bld gHb Est-mCnc: 128 mg/dL
Hgb A1c MFr Bld: 6.1 % — ABNORMAL HIGH (ref 4.8–5.6)

## 2022-06-03 LAB — LIPID PANEL
Chol/HDL Ratio: 2.1 ratio (ref 0.0–5.0)
Cholesterol, Total: 170 mg/dL (ref 100–199)
HDL: 80 mg/dL (ref >39)
LDL Chol Calc (NIH): 69 mg/dL (ref 0–99)
Triglycerides: 119 mg/dL (ref 0–149)
VLDL Cholesterol Cal: 21 mg/dL (ref 5–40)

## 2022-06-03 NOTE — Telephone Encounter (Signed)
CTA head [employer] auth: [id number] exp. 06/03/22-07/02/22 CTA neck [employer] Josem Kaufmann: [id number] exp. 06/03/22-07/02/22 Sent to [facility] [phone]

## 2022-06-05 DIAGNOSIS — I4891 Unspecified atrial fibrillation: Secondary | ICD-10-CM | POA: Diagnosis not present

## 2022-06-09 ENCOUNTER — Telehealth: Payer: Self-pay | Admitting: Internal Medicine

## 2022-06-09 NOTE — Telephone Encounter (Signed)
Patient stated he has been wearing a ZIO monitor for 4 days and will need to take a CT scan tomorrow.  Patient would like to if he will need to take off the monitor to do the CT scan and would he need to reapply the monitor or will he need to end the test and send the equipment back

## 2022-06-09 NOTE — Telephone Encounter (Signed)
Pt is having a CT Angio Head tomorrow 06/10/2022.  Pt unsure if he should continue wearing his monitor during this CT scan and called [place] [facility].     Pt called, stated he started wearing his 7 day Zio Monitor on morning of 06/05/22.  He has worn the monitor 5 days;  After consulting Ms. Markus Daft, and assessing the location of the CT vs monitor, Pt was told to wear monitor until tomorrow morning, then remove it prior to his CT Angio of the Head, and mail it back.   This will shorten the cardiac monitoring, but enough data should be available for [name] to assess.    Pt understood instructions, and stated he will mail it back to [facility].  [name] will be notified.

## 2022-06-10 ENCOUNTER — Ambulatory Visit
Admission: RE | Admit: 2022-06-10 | Discharge: 2022-06-10 | Disposition: A | Discharge status: Home / Self Care | Payer: BC Managed Care – PPO | Source: Ambulatory Visit | Attending: Neurology | Admitting: Neurology

## 2022-06-10 DIAGNOSIS — I639 Cerebral infarction, unspecified: Secondary | ICD-10-CM

## 2022-06-10 MED ORDER — IOPAMIDOL (ISOVUE-370) INJECTION 76%
75.0000 mL | Freq: Once | INTRAVENOUS | Status: AC | PRN
  Administered 2022-06-10: 75 mL via INTRAVENOUS

## 2022-11-01 ENCOUNTER — Other Ambulatory Visit: Payer: Self-pay | Admitting: Internal Medicine

## 2022-12-27 ENCOUNTER — Inpatient Hospital Stay
Admit: 2022-12-27 | Discharge: 2022-12-27 | Disposition: A | Discharge status: Other Acute Inpatient Hospital | Payer: BLUE CROSS/BLUE SHIELD | Attending: Emergency Medicine

## 2022-12-27 ENCOUNTER — Inpatient Hospital Stay
Admit: 2022-12-27 | Discharge: 2023-01-07 | Disposition: A | Discharge status: Home / Self Care | Payer: BLUE CROSS/BLUE SHIELD | Source: Other Acute Inpatient Hospital | Attending: Hospitalist | Admitting: Hospitalist

## 2022-12-27 DIAGNOSIS — D693 Immune thrombocytopenic purpura: Secondary | ICD-10-CM

## 2022-12-27 DIAGNOSIS — D696 Thrombocytopenia, unspecified: Secondary | ICD-10-CM

## 2022-12-27 LAB — ANTIBODY SCREEN
Antibody Screen: NEGATIVE
Antibody Screen: NEGATIVE

## 2022-12-27 LAB — CBC WITH AUTO DIFFERENTIAL
Basophils %: 0.5 % (ref 0.0–2.0)
Basophils Absolute: 0 10*3/uL (ref 0.0–0.2)
Eosinophils %: 2.7 % (ref 0.0–7.0)
Eosinophils Absolute: 0.2 10*3/uL (ref 0.0–0.5)
Hematocrit: 43.7 % (ref 38.0–52.0)
Hemoglobin: 14.9 g/dL (ref 13.0–17.3)
Immature Grans (Abs): 0.02 10*3/uL (ref 0.00–0.06)
Immature Granulocytes %: 0.2 % (ref 0.0–0.6)
Lymphocytes Absolute: 1.2 10*3/uL (ref 1.0–3.2)
Lymphocytes: 13.9 % — ABNORMAL LOW (ref 15.0–45.0)
MCH: 30.8 pg (ref 27.0–34.5)
MCHC: 34.1 g/dL (ref 32.0–36.0)
MCV: 90.5 fL (ref 84.0–100.0)
Monocytes %: 12.6 % — ABNORMAL HIGH (ref 4.0–12.0)
Monocytes Absolute: 1.1 10*3/uL — ABNORMAL HIGH (ref 0.3–1.0)
NRBC Absolute: 0 10*3/uL (ref 0.000–0.012)
NRBC Automated: 0 % (ref 0.0–0.2)
Neutrophils %: 70.1 % (ref 42.0–74.0)
Neutrophils Absolute: 5.9 10*3/uL (ref 1.6–7.3)
Platelets: 2 10*3/uL — CL (ref 140–440)
RBC: 4.83 x10e6/mcL (ref 4.00–5.60)
RDW: 13 % (ref 11.0–16.0)
WBC: 8.4 10*3/uL (ref 3.8–10.6)

## 2022-12-27 LAB — COMPREHENSIVE METABOLIC PANEL
ALT: 18 U/L (ref 0–50)
AST: 24 U/L (ref 0–50)
Albumin/Globulin Ratio: 1.64 (ref 1.00–2.70)
Albumin: 4.1 g/dL (ref 3.5–5.2)
Alk Phosphatase: 50 U/L (ref 40–130)
Anion Gap: 11 mmol/L (ref 2–17)
BUN: 18 mg/dL (ref 8–23)
CO2: 21 mmol/L — ABNORMAL LOW (ref 22–29)
Calcium: 9 mg/dL (ref 8.5–10.7)
Chloride: 104 mmol/L (ref 98–107)
Creatinine: 0.8 mg/dL (ref 0.7–1.3)
Est, Glom Filt Rate: 88 mL/min/1.73m (ref >=60)
Globulin: 2.5 g/dL (ref 1.9–4.4)
Glucose: 125 mg/dL — ABNORMAL HIGH (ref 70–99)
Osmolaliy Calculated: 275 mOsm/kg (ref 270–287)
Potassium: 4.4 mmol/L (ref 3.5–5.3)
Sodium: 136 mmol/L (ref 135–145)
Total Bilirubin: 0.6 mg/dL (ref 0.00–1.20)
Total Protein: 6.6 g/dL (ref 5.7–8.3)

## 2022-12-27 LAB — PROTIME-INR
INR: 1.3 — ABNORMAL LOW (ref 1.5–3.5)
Protime: 16.7 seconds — ABNORMAL HIGH (ref 11.6–14.5)

## 2022-12-27 LAB — PREPARE PLATELETS
Blood Bank ISBT Product Blood Type: 8400
Expiration Date: 202405062359

## 2022-12-27 LAB — ABO/RH
ABO/Rh: A POS
ABO/Rh: A POS

## 2022-12-27 LAB — IMMATURE PLATELET FRACTION
Immature Platelet, Absolute: 0 10*3/uL
Immature Platelet, Percent: 0.9 % — ABNORMAL LOW (ref 1.2–8.6)

## 2022-12-27 MED ORDER — NORMAL SALINE FLUSH 0.9 % IV SOLN
0.9 | Freq: Two times a day (BID) | INTRAVENOUS | Status: DC
Start: 2022-12-27 — End: 2023-01-07
  Administered 2022-12-28 – 2023-01-02 (×12): 10 mL via INTRAVENOUS
  Administered 2023-01-03: 01:00:00 20 mL via INTRAVENOUS
  Administered 2023-01-03 – 2023-01-07 (×9): 10 mL via INTRAVENOUS

## 2022-12-27 MED ORDER — ONDANSETRON 4 MG PO TBDP
4 | Freq: Three times a day (TID) | ORAL | Status: DC | PRN
Start: 2022-12-27 — End: 2023-01-07

## 2022-12-27 MED ORDER — POTASSIUM BICARB-CITRIC ACID 20 MEQ PO TBEF
20 | ORAL | Status: DC | PRN
Start: 2022-12-27 — End: 2022-12-28

## 2022-12-27 MED ORDER — NORMAL SALINE FLUSH 0.9 % IV SOLN
0.9 | INTRAVENOUS | Status: DC | PRN
Start: 2022-12-27 — End: 2023-01-07
  Administered 2022-12-31: 10 mL via INTRAVENOUS
  Administered 2022-12-31: 10:00:00 30 mL via INTRAVENOUS
  Administered 2023-01-01 – 2023-01-02 (×2): 10 mL via INTRAVENOUS

## 2022-12-27 MED ORDER — MAGNESIUM SULFATE 2 GM/50ML IV SOLN
2 | INTRAVENOUS | Status: DC | PRN
Start: 2022-12-27 — End: 2022-12-28

## 2022-12-27 MED ORDER — SODIUM CHLORIDE 0.9 % IV SOLN
0.9 % | Freq: Once | INTRAVENOUS | Status: AC
Start: 2022-12-27 — End: 2022-12-27
  Administered 2022-12-27: 18:00:00 40 mg via INTRAVENOUS

## 2022-12-27 MED ORDER — ACETAMINOPHEN 650 MG RE SUPP
650 | Freq: Four times a day (QID) | RECTAL | Status: DC | PRN
Start: 2022-12-27 — End: 2023-01-07

## 2022-12-27 MED ORDER — SODIUM CHLORIDE 0.9 % IV SOLN
0.9 | INTRAVENOUS | Status: DC | PRN
Start: 2022-12-27 — End: 2022-12-29

## 2022-12-27 MED ORDER — SODIUM CHLORIDE 0.9 % IV SOLN
0.9 % | Freq: Every day | INTRAVENOUS | Status: AC
Start: 2022-12-27 — End: 2023-01-01
  Administered 2022-12-28 – 2022-12-31 (×4): 40 mg via INTRAVENOUS

## 2022-12-27 MED ORDER — SODIUM CHLORIDE 0.9 % IV SOLN
0.9 | INTRAVENOUS | Status: DC | PRN
Start: 2022-12-27 — End: 2023-01-07

## 2022-12-27 MED ORDER — POTASSIUM CHLORIDE CRYS ER 20 MEQ PO TBCR
20 | ORAL | Status: DC | PRN
Start: 2022-12-27 — End: 2022-12-28

## 2022-12-27 MED ORDER — SODIUM CHLORIDE 0.9 % IV SOLN
0.9 | INTRAVENOUS | Status: DC | PRN
Start: 2022-12-27 — End: 2022-12-27

## 2022-12-27 MED ORDER — POLYETHYLENE GLYCOL 3350 17 G PO PACK
17 | Freq: Every day | ORAL | Status: DC | PRN
Start: 2022-12-27 — End: 2023-01-07

## 2022-12-27 MED ORDER — POTASSIUM CHLORIDE 10 MEQ/100ML IV SOLN
10 | INTRAVENOUS | Status: DC | PRN
Start: 2022-12-27 — End: 2022-12-28

## 2022-12-27 MED ORDER — ACETAMINOPHEN 325 MG PO TABS
325 | Freq: Four times a day (QID) | ORAL | Status: DC | PRN
Start: 2022-12-27 — End: 2023-01-07

## 2022-12-27 MED ORDER — ONDANSETRON HCL 4 MG/2ML IJ SOLN
4 | Freq: Four times a day (QID) | INTRAMUSCULAR | Status: DC | PRN
Start: 2022-12-27 — End: 2023-01-07

## 2022-12-27 MED ORDER — IMMUNE GLOBULIN (PRIVIGEN) 10%
Freq: Once | INTRAVENOUS | Status: AC
Start: 2022-12-27 — End: 2022-12-27
  Administered 2022-12-27: 23:00:00 20000 mg via INTRAVENOUS

## 2022-12-27 MED FILL — PRIVIGEN 20 GM/200ML IV SOLN: 20 GM/0ML | INTRAVENOUS | Qty: 200

## 2022-12-27 MED FILL — NORMAL SALINE FLUSH 0.9 % IV SOLN: 0.9 % | INTRAVENOUS | Qty: 20

## 2022-12-27 MED FILL — DEXAMETHASONE SODIUM PHOSPHATE 4 MG/ML IJ SOLN: 4 MG/ML | INTRAMUSCULAR | Qty: 10

## 2022-12-27 NOTE — H&P (Signed)
[facility] Hospitalist Service     Hospitalist H & P     Name: Luis Wilcox   DOB: 05-05-1941 (Age: 82 y.o.)   Date of Admission: 12/27/22    Primary Care Provider: No primary care provider on file.  Attending: Cory Munch, MD    No chief complaint on file.       History of Present Illness     Luis Wilcox is a 82 y.o. 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  Patient reports noticing excessive bruising over the forearms and petechial rash over lower extremities the last few days.  Wife noticed the rash on the legs yesterday.  States he accidentally bit his tongue and it caused a big bruise.  His cat touched his left forearm and it resulted in bruising as well.  He denies any recent sore throat fever chills URI symptoms, no chest pain shortness of breath abdominal pain nausea vomiting diarrhea bloody stools blood in urine.    Patient showed me the labs from December 15, 2022- from his PCP visit in  [place].  WBC: 6.7 RBC 4.87 hemoglobin 14.4 MCV 90.8 MCHC 32.6 normal Red cell distribution width.  Platelet count 189  States in last year fall -eliquis was changed to xarelto by his neurologist when he had mild stroke.  States he reported that on very rare occaionsn forgets to eliquis at night and this might be the reason for the change.  States the cost of xarelto expensive and he is in the plans of switching back to Eliquis but has not done this yet- already had elqius from previous script 2 mo supply.   2 weeks ago-his rosuvastatin dose was increased to 2 10 mg instead of 5 mg because his LDL was low   Patient otherwise denies any recent medication changes.  Does not smoke or drink alcohol.    In [place] ED his platelet count was 2.0-other workup was normal-Case was discussed with hematology oncology and patient received 40 Mg Decadron prior to transfer and received 1 unit  platelets during transportation.      Past Medical History     Past Medical History:   Diagnosis Date    Atrial fibrillation (HCC)     Hyperlipidemia     Hypertension         Past Surgical History   No past surgical history on file.     Allergies   Patient has no known allergies.     Medications   No current outpatient medications      Family History   No family history on file.     Social History      Social History     Socioeconomic History    Marital status: Married     Spouse name: Not on file    Number of children: Not on file    Years of education: Not on file    Highest education level: Not on file   Occupational History    Not on file   Tobacco Use    Smoking status: Not on file    Smokeless tobacco: Not on file   Substance and Sexual Activity    Alcohol use: Not on file    Drug use: Not on file    Sexual activity: Not on file   Other Topics Concern    Not on file  Social History Narrative    Not on file     Social Determinants of Health     Financial Resource Strain: Not on file   Food Insecurity: Not on file   Transportation Needs: Not on file   Physical Activity: Not on file   Stress: Not on file   Social Connections: Not on file   Intimate Partner Violence: Not on file   Housing Stability: Not on file        Review of Systems (positives bolded, otherwise negative)     A comprehensive review of systems was negative except for what was noted in the HPI.      Except as otherwise documented above, the patient's complete review of systems was unremarkable.       Physical Exam     BP (!) 151/92   Pulse 91   Temp 97.6 F (36.4 C) (Oral)   Resp 16   Ht 1.753 m (5\' 9" )   Wt 70 kg (154 lb 6.4 oz)   SpO2 96%   BMI 22.80 kg/m     General Appearance: alert and oriented to person, place and time, well developed and well- nourished, in no acute distress  Skin: Petechial rash over bilateral lower extremities, bruising over anterior aspect of the tongue towards the right side, bruising over forearms left>  right.  Pulmonary/Chest: clear to auscultation bilaterally- no wheezes, rales or rhonchi, normal air movement, no respiratory distress  Cardiovascular: normal rate, regular rhythm, normal S1 and S2, no murmurs, rubs, clicks, or gallops, distal pulses intact, no carotid bruits  Abdomen: soft, non-tender, non-distended, normal bowel sounds, no masses or organomegaly  Extremities: no cyanosis, clubbing or edema  Musculoskeletal: normal range of motion, no joint swelling, deformity or tenderness  Neurologic: reflexes normal and symmetric, no cranial nerve deficit, gait, coordination and speech normal       Labs and Imaging     Labs:   Hematologic/Coags Chemistries   Recent Labs     12/27/22  1141   WBC 8.4   HGB 14.9   HCT 43.7   PLT 2*     Lab Results   Component Value Date/Time    ALBUMIN 4.1 12/27/2022 11:41 AM     No components found for: "HGBA1C"  Lab Results   Component Value Date/Time    INR 1.3 12/27/2022 11:41 AM    PROTIME 16.7 12/27/2022 11:41 AM     No results found for: "APTT"  No results found for: "DDIMER"   Recent Labs     12/27/22  1141   NA 136   K 4.4   CL 104   CO2 21*   BUN 18   CREATININE 0.8   PROTIME 16.7*   ALBUMIN 4.1   BILITOT 0.60   ALKPHOS 50   AST 24   ALT 18     No results for input(s): "GLU" in the last 72 hours.  No results found for: "CPK", "CKMB", "TROPONINI"  No results found for: "IRON", "FERRITIN"     Inflammatory/Respiratory Diabetes   No results found for: "CRP"  No results found for: "ESR"  ABGs:  No results found for: "PHART", "PO2ART", "HCO3", "PCO2ART"   Lab Results   Component Value Date/Time    CREATININE 0.8 12/27/2022 11:41 AM                No orders to display         Past Cardiac History  ECHO:  No results found for this or any previous visit.      Past Microbiologic History (if applicable)  No results for input(s): "SDES", "CULTURE" in the last 72 hours.     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   We discussed preferences in the event of cardiac or respiratory failure.  Based on this discussion, CODE Status is Full.  This is reflected in patient orders.     Disposition     home    Cory Munch, MD  12/27/2022 7:08 PM  [facility] Hospitalist Service    +++++++++++++++++++++++++++++++++++++++++++++++++++++++++++++    This note was created using voice recognition software and may contain typographic errors missed during final review. The intent is to have a complete and accurate medical record.   As a valued partner in this safety effort, if you have noted factual errors, please complete the Health Information Amendment/Correct Form or call the [facility] Health Information Management Office at [phone].

## 2022-12-27 NOTE — ED Notes (Signed)
Unit called and gave an update on patient status. Platelets infusing while transported. VSS. Family aware of plan

## 2022-12-27 NOTE — Other (Signed)
Informed Consent for Blood Component Transfusion Note    I have discussed with the patient the rationale for blood component transfusion; its benefits in treating or preventing fatigue, organ damage, or death; and its risk which includes mild transfusion reactions, rare risk of blood borne infection, or more serious but rare reactions. I have discussed the alternatives to transfusion, including the risk and consequences of not receiving transfusion. The patient had an opportunity to ask questions and had agreed to proceed with transfusion of blood components.    Electronically signed by Clancy Gourd, MD on 12/27/22 at 1:02 PM EDT

## 2022-12-27 NOTE — ED Provider Notes (Signed)
[facility] EMERGENCY DEPT  EMERGENCY DEPARTMENT ENCOUNTER      Pt Name: Luis Wilcox  MRN: [id number]  Birthdate 1940/11/23  Date of evaluation: 12/27/2022  Provider: Clancy Gourd, MD    Provider evaluation time: 12/27/22 1124    CHIEF COMPLAINT       Chief Complaint   Patient presents with    Skin Issue     Pt has been taking blood thinners for a few years for afib and they recently changed the medication a few months ago. Yesterday his wife noted some Petechiae rashes all over the legs and now starting on the face. Pt also has some bruising to mouth.         HISTORY OF PRESENT ILLNESS      Luis Wilcox is a 82 y.o. male who presents to the emergency department with rash     Presents with a rash.  Patient is anticoagulated on Xarelto.  Noticed a rash on his legs that are little red dots that have increased today and spread to his arms and noticed blood blisters in his mouth.  He otherwise feels well and has no complaints presents.  He denies any bloody stool or melena.  No abdominal pain no fevers or cough or shortness of breath chest pain recently.  She is otherwise been well has had no recent changes in his medicines.  He also does take fish oil he has been on that for years    The history is provided by the patient.       Nursing Notes were reviewed.    REVIEW OF SYSTEMS         Review of Systems   Skin:  Positive for rash.       Except as noted above the remainder of the review of systems was reviewed and negative.       PAST MEDICAL HISTORY     Past Medical History:   Diagnosis Date    Atrial fibrillation (HCC)     Hyperlipidemia     Hypertension          SURGICAL HISTORY     No past surgical history on file.      CURRENT MEDICATIONS       Previous Medications    No medications on file       ALLERGIES     Patient has no known allergies.    FAMILY HISTORY     No family history on file.       SOCIAL HISTORY       Social History     Socioeconomic History    Marital status: Married       SCREENINGS          Glasgow Coma Scale  Eye Opening: Spontaneous  Best Verbal Response: Oriented  Best Motor Response: Obeys commands  Glasgow Coma Scale Score: 15                     CIWA Assessment  BP: 139/89  Pulse: 73                 PHYSICAL EXAM       ED Triage Vitals [12/27/22 1114]   BP Temp Temp Source Pulse Respirations SpO2 Height Weight - Scale   139/89 98.1 F (36.7 C) Oral 73 18 98 % 1.753 m (5\' 9" ) 70.3 kg (155 lb)       Physical Exam  Vitals and nursing note reviewed.   Constitutional:  General: He is not in acute distress.     Appearance: He is not ill-appearing.   HENT:      Head: Normocephalic and atraumatic.      Comments: Small hemorrhagic vesicles in mouth and on tongue  Eyes:      Extraocular Movements: Extraocular movements intact.   Cardiovascular:      Rate and Rhythm: Normal rate.      Heart sounds: Normal heart sounds.   Pulmonary:      Effort: Pulmonary effort is normal.      Breath sounds: Normal breath sounds.   Abdominal:      General: Abdomen is flat.      Palpations: Abdomen is soft.      Tenderness: There is no abdominal tenderness.   Musculoskeletal:         General: Normal range of motion.      Cervical back: Normal range of motion.      Right lower leg: No edema.      Left lower leg: No edema.   Skin:     Findings: Rash present.      Comments: Petechial rash on legs a few scattered lesions on his arms   Neurological:      General: No focal deficit present.      Mental Status: He is alert and oriented to person, place, and time.   Psychiatric:         Behavior: Behavior normal.         DIAGNOSTIC RESULTS     RADIOLOGY:   Non-plain film images such as CT, Ultrasound and MRI are read by the radiologist. Plain radiographic images are visualized and preliminarily interpreted by the emergency physician with the findings dictated in the ED Reassessment.      Interpretation per the Radiologist below, if available at the time of this note:    No orders to display         ED BEDSIDE ULTRASOUND:    Performed by ED Physician - none    LABS:  Labs Reviewed   COMPREHENSIVE METABOLIC PANEL - Abnormal; Notable for the following components:       Result Value    CO2 21 (*)     Glucose 125 (*)     All other components within normal limits   CBC WITH AUTO DIFFERENTIAL - Abnormal; Notable for the following components:    Platelets 2 (*)     Lymphocytes 13.9 (*)     Monocytes % 12.6 (*)     Monocytes Absolute 1.1 (*)     All other components within normal limits   PROTIME-INR - Abnormal; Notable for the following components:    Protime 16.7 (*)     INR 1.3 (*)     All other components within normal limits   IMMATURE PLATELET FRACTION - Abnormal; Notable for the following components:    Immature Platelet, Percent 0.9 (*)     All other components within normal limits   PTT   TYPE AND SCREEN    Narrative:     Specimen is valid for 3 days - nurse to verify valid specimen   PREPARE PLATELETS   ABO/RH    Narrative:     Specimen is valid for 3 days - nurse to verify valid specimen   ANTIBODY SCREEN    Narrative:     Specimen is valid for 3 days - nurse to verify valid specimen       All other labs were  within normal range or not returned as of this dictation.    EMERGENCY DEPARTMENT COURSE and DIFFERENTIAL DIAGNOSIS/MDM:   Vitals:    Vitals:    12/27/22 1114   BP: 139/89   Pulse: 73   Resp: 18   Temp: 98.1 F (36.7 C)   TempSrc: Oral   SpO2: 98%   Weight: 70.3 kg (155 lb)   Height: 1.753 m (5\' 9" )         Medical Decision Making  Presents with new onset petechiae will check his platelets as well as his other blood counts coags and TSH.  I suspect he is thrombocytopenic.    Amount and/or Complexity of Data Reviewed  Labs: ordered. Decision-making details documented in ED Course.    Risk  Prescription drug management.          REASSESSMENT     ED Course as of 12/27/22 1322   Mon Dec 27, 2022   1206 Quite thrombocytopenic. Will discuss with heme. [NL]   1320 I spoke with Dr. Victory Dakin who recommends platelet transfusion now as  well as 40 mg of Decadron.  She would like the patient admitted to fifth floor [facility] hospitalist service for continued Decadron and IVIG.  I will speak with the hospitalist for transfer.  I spoken with the patient and his wife as well about diagnosis and plan. [NL]      ED Course User Index  [NL] Clancy Gourd, MD           CONSULTS:  IP CONSULT TO HEMATOLOGY    PROCEDURES:  Unless otherwise noted below, none     Procedures  Total Critical Care time was 35 minutes, excluding separately reportable procedures, but including frequent and multiple evaluations for critical illness, family conversations, chart review, case review with nursing and consultation with medical specialist(s).  There was a high probability of clinically significant/life threatening deterioration in the patient's condition which required my emergent intervention.        FINAL IMPRESSION      1. Thrombocytopenia [facility])          DISPOSITION/PLAN   DISPOSITION Decision To Transfer 12/27/2022 01:22:46 PM      PATIENT REFERRED TO:  No follow-up provider specified.    DISCHARGE MEDICATIONS:  New Prescriptions    No medications on file     Controlled Substances Monitoring:          No data to display                (Please note that portions of this note were completed with a voice recognition program.  Efforts were made to edit the dictations but occasionally words are mis-transcribed.)    Clancy Gourd, MD (electronically signed)  Attending Emergency Physician              Clancy Gourd, MD  12/27/22 1322

## 2022-12-27 NOTE — Consults (Signed)
Hematology & Oncology Consult Note            Date:12/27/2022        Patient Name:Luis Wilcox       Date of Birth:August 17, 1941         Age: 82 y.o. male    Reason for Consult: Severe thrombocytopenia    History of Present Illness: Luis Wilcox is a very pleasant 82 year old male with history of atrial fibrillation on anticoagulation with Xarelto, history of CVA who presented earlier today with bruising and petechiae.  Symptoms have been progressive over the past several days.  Wife noticed a rash on his legs.  He excellently bit his tongue and this resulted in bruising/bleeding.  Labs in the ED notable for CBC with platelet count of 2,000.  He denies recent infectious symptoms.  He denies new medications.  Rosuvastatin recently increased.  White blood cell count and hemoglobin normal. Last dose of Xarelto was Sunday evening. He denies epistaxis, headache, change in vision or blood loss in stool. Reports history of mild gastritis/reflux, takes Tums prn. Denies history of H.pylori infection. Denies fevers/chills. Denies unintentional weight loss.     Past Medical History:  Past Medical History:   Diagnosis Date    Atrial fibrillation (HCC)     Hyperlipidemia     Hypertension         Past Surgical History:  No past surgical history on file.     Social History:  Social History       Tobacco History       Smoking Status  Never Assessed      Smokeless Tobacco Use  Unknown              Alcohol History       Alcohol Use Status  Not Asked              Drug Use       Drug Use Status  Not Asked              Sexual Activity       Sexually Active  Not Asked                    Family History:  No family history on file.    Allergies:  Patient has no known allergies.    Medications:  Prior to Admission medications    Not on File        sodium chloride flush 0.9 % injection 5-40 mL, 2 times per day  sodium chloride flush 0.9 % injection 5-40 mL, PRN  0.9 % sodium chloride infusion, PRN  potassium chloride (KLOR-CON M) extended  release tablet 40 mEq, PRN   Or  potassium bicarb-citric acid (EFFER-K) effervescent tablet 40 mEq, PRN   Or  potassium chloride 10 mEq/100 mL IVPB (Peripheral Line), PRN  magnesium sulfate 2000 mg in water 50 mL IVPB, PRN  ondansetron (ZOFRAN-ODT) disintegrating tablet 4 mg, Q8H PRN   Or  ondansetron (ZOFRAN) injection 4 mg, Q6H PRN  polyethylene glycol (GLYCOLAX) packet 17 g, Daily PRN  acetaminophen (TYLENOL) tablet 650 mg, Q6H PRN   Or  acetaminophen (TYLENOL) suppository 650 mg, Q6H PRN  [START ON 12/28/2022] dexAMETHasone (DECADRON) 40 mg in sodium chloride 0.9 % 50 mL IVPB, Daily  0.9 % sodium chloride infusion, PRN  famotidine (PEPCID) tablet 20 mg, BID        Review of Systems:  A 14 point ROS was performed. Pertinent positives included in HPI, otherwise  ROS negative.    Physical Exam:  BP 103/76   Pulse 85   Temp 97.8 F (36.6 C) (Oral)   Resp 18   Ht 1.753 m (5\' 9" )   Wt 70 kg (154 lb 6.4 oz)   SpO2 97%   BMI 22.80 kg/m     GEN: Appears well, in no apparent distress.  HEENT: EOMI. No scleral icterus. Normocephalic, atraumatic. +Bruising on tongue, blisters noted.  CV: Regular rate and rhythm. No murmurs rubs or gallops.  RESP: Breathing non-labored. Clear breath sounds bilaterally.  ABD: Soft, nontender, nondistended. No rebound or guarding. No hepatomegaly. No splenomegaly.  MSK: Normal strength and range of motion. No tenderness or swelling.  SKIN: +Petechiae noted on bilateral LE.  NEURO: Alert, oriented x3. CN II - XII grossly intact.  PSYCH: Appropriate mood, normal affect.    Laboratory Data:  Recent Labs     12/27/22  1141   WBC 8.4   RBC 4.83   HGB 14.9   HCT 43.7   MCV 90.5   RDW 13.0   PLT 2*     Recent Labs     12/27/22  1141   NA 136   K 4.4   CL 104   CO2 21*   BUN 18   CREATININE 0.8   GLUCOSE 125*     Recent Labs     12/27/22  1141   AST 24   ALT 18   BILITOT 0.60   ALKPHOS 50       Recent Labs     12/27/22  1141   PROTIME 16.7*   INR 1.3*      ASSESSMENT/PLAN:  Hospital Problems              Last Modified POA    * (Principal) Severe thrombocytopenia (HCC) 12/27/2022 Yes     1.  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  Will check labs including HIV, hepatitis panel, ANA with reflex panel, H.Pylori antigen.  I recommended empiric treatment for ITP with dexamethasone 40 mg daily x 4 days.  Given severity of platelet count at 2,000 recommended empiric transfusion.  I have also recommended IVIG 400mg /kg x1 today, can repeat subsequent dosing tomorrow if platelets remain low.  Will require bone marrow biopsy at a later date pending improvement in platelet count. Please repeat CBC following completion of platelet transfusion this evening.   2.  History of atrial fibrillation: Agree with holding anticoagulation with Xarelto given severity of thrombocytopenia. Consider reinitiation as a later date as outpatient pending improvement.    Thank you for this consultation.   Please contact me with any questions [phone].    Orpah Melter, MD

## 2022-12-27 NOTE — Other (Signed)
Informed Consent for Blood Component Transfusion Note    I have discussed with the patient the rationale for blood component transfusion; its benefits in treating or preventing fatigue, organ damage, or death; and its risk which includes mild transfusion reactions, rare risk of blood borne infection, or more serious but rare reactions. I have discussed the alternatives to transfusion, including the risk and consequences of not receiving transfusion. The patient had an opportunity to ask questions and had agreed to proceed with transfusion of blood components.    Electronically signed by Cory Munch, MD on 12/27/22 at 7:08 PM EDT

## 2022-12-27 NOTE — ED Notes (Signed)
[id number] clean, [id number]

## 2022-12-28 LAB — BASIC METABOLIC PANEL
Anion Gap: 11 mmol/L (ref 2–17)
BUN: 19 mg/dL (ref 8–23)
CO2: 21 mmol/L — ABNORMAL LOW (ref 22–29)
Calcium: 8.8 mg/dL (ref 8.5–10.7)
Chloride: 105 mmol/L (ref 98–107)
Creatinine: 0.7 mg/dL (ref 0.7–1.3)
Est, Glom Filt Rate: 91 mL/min/1.73m (ref >=60)
Glucose: 156 mg/dL — ABNORMAL HIGH (ref 70–99)
Osmolaliy Calculated: 279 mOsm/kg (ref 270–287)
Potassium: 4.2 mmol/L (ref 3.5–5.3)
Sodium: 137 mmol/L (ref 135–145)

## 2022-12-28 LAB — CBC WITH AUTO DIFFERENTIAL
Basophils %: 0.1 % (ref 0.0–2.0)
Basophils Absolute: 0 10*3/uL (ref 0.0–0.2)
Eosinophils %: 0 % (ref 0.0–7.0)
Eosinophils Absolute: 0 10*3/uL (ref 0.0–0.5)
Hematocrit: 37.4 % — ABNORMAL LOW (ref 38.0–52.0)
Hemoglobin: 12.6 g/dL — ABNORMAL LOW (ref 13.0–17.3)
Immature Grans (Abs): 0.03 10*3/uL (ref 0.00–0.06)
Immature Granulocytes %: 0.2 % (ref 0.0–0.6)
Lymphocytes Absolute: 0.6 10*3/uL — ABNORMAL LOW (ref 1.0–3.2)
Lymphocytes: 4.5 % — ABNORMAL LOW (ref 15.0–45.0)
MCH: 30.2 pg (ref 27.0–34.5)
MCHC: 33.7 g/dL (ref 32.0–36.0)
MCV: 89.7 fL (ref 84.0–100.0)
MPV: 13.3 fL — ABNORMAL HIGH (ref 7.2–13.2)
Monocytes %: 1.9 % — ABNORMAL LOW (ref 4.0–12.0)
Monocytes Absolute: 0.2 10*3/uL — ABNORMAL LOW (ref 0.3–1.0)
NRBC Absolute: 0 10*3/uL (ref 0.000–0.012)
NRBC Automated: 0 % (ref 0.0–0.2)
Neutrophils %: 93.3 % — ABNORMAL HIGH (ref 42.0–74.0)
Neutrophils Absolute: 11.5 10*3/uL — ABNORMAL HIGH (ref 1.6–7.3)
Platelets: 7 10*3/uL — CL (ref 140–440)
RBC: 4.17 x10e6/mcL (ref 4.00–5.60)
RDW: 12.9 % (ref 11.0–16.0)
WBC: 12.3 10*3/uL — ABNORMAL HIGH (ref 3.8–10.6)

## 2022-12-28 LAB — IMMATURE PLATELET FRACTION
Immature Platelet, Absolute: 2.1 10*3/uL
Immature Platelet, Absolute: 1.3 10*3/uL
Immature Platelet, Percent: 11.9 % — ABNORMAL HIGH (ref 1.2–8.6)
Immature Platelet, Percent: 18.9 % — ABNORMAL HIGH (ref 1.2–8.6)

## 2022-12-28 LAB — PREPARE PLATELETS
Blood Bank ISBT Product Blood Type: 5100
Blood Bank ISBT Product Blood Type: 9500
Expiration Date: 202405102359
Expiration Date: 202405082359

## 2022-12-28 LAB — CBC
Hematocrit: 37.5 % — ABNORMAL LOW (ref 38.0–52.0)
Hemoglobin: 12.5 g/dL — ABNORMAL LOW (ref 13.0–17.3)
MCH: 30 pg (ref 27.0–34.5)
MCHC: 33.3 g/dL (ref 32.0–36.0)
MCV: 90.1 fL (ref 84.0–100.0)
MPV: 12.2 fL (ref 7.2–13.2)
NRBC Absolute: 0 10*3/uL (ref 0.000–0.012)
NRBC Automated: 0 % (ref 0.0–0.2)
Platelets: 18 10*3/uL — CL (ref 140–440)
RBC: 4.16 x10e6/mcL (ref 4.00–5.60)
RDW: 13 % (ref 11.0–16.0)
WBC: 9.2 10*3/uL (ref 3.8–10.6)

## 2022-12-28 LAB — HEPATITIS PANEL, ACUTE
Hep A IgM: NEGATIVE
Hep B Core Ab, IgM: NEGATIVE
Hepatitis B Surface Ag: NEGATIVE
Hepatitis C Ab: NEGATIVE

## 2022-12-28 LAB — HIV SCREEN: HIV Screen: NEGATIVE

## 2022-12-28 MED ORDER — PRESERVISION AREDS 2 PO CAPS
Freq: Two times a day (BID) | ORAL | Status: DC
Start: 2022-12-28 — End: 2022-12-28

## 2022-12-28 MED ORDER — ROSUVASTATIN CALCIUM 10 MG PO TABS
10 | Freq: Every evening | ORAL | Status: DC
Start: 2022-12-28 — End: 2022-12-28

## 2022-12-28 MED ORDER — FISH OIL 1000 MG PO CAPS
1000 MG | Freq: Every day | ORAL | Status: AC
Start: 2022-12-28 — End: 2022-12-31
  Administered 2022-12-30: 12:00:00 1000 via ORAL

## 2022-12-28 MED ORDER — FAMOTIDINE 20 MG PO TABS
20 MG | Freq: Two times a day (BID) | ORAL | Status: AC
Start: 2022-12-28 — End: 2022-12-29
  Administered 2022-12-28 – 2022-12-29 (×4): 20 mg via ORAL

## 2022-12-28 MED ORDER — SODIUM CHLORIDE 0.9 % IV SOLN
0.9 | INTRAVENOUS | Status: DC | PRN
Start: 2022-12-28 — End: 2022-12-29

## 2022-12-28 MED ORDER — VITAMIN D 25 MCG (1000 UT) PO TABS
25 | Freq: Every day | ORAL | Status: DC
Start: 2022-12-28 — End: 2023-01-07
  Administered 2022-12-29 – 2023-01-07 (×10): 1000 [IU] via ORAL

## 2022-12-28 MED ORDER — ATENOLOL 25 MG PO TABS
25 | Freq: Two times a day (BID) | ORAL | Status: DC
Start: 2022-12-28 — End: 2023-01-07
  Administered 2022-12-29 – 2023-01-07 (×20): 50 mg via ORAL

## 2022-12-28 MED ORDER — ROSUVASTATIN CALCIUM 10 MG PO TABS
10 | Freq: Every evening | ORAL | Status: DC
Start: 2022-12-28 — End: 2023-01-07
  Administered 2022-12-29 – 2023-01-07 (×10): 10 mg via ORAL

## 2022-12-28 MED ORDER — OCUVITE-LUTEIN PO TABS
Freq: Every day | ORAL | Status: AC
Start: 2022-12-28 — End: 2023-01-02
  Administered 2022-12-29 – 2022-12-31 (×3): 1 via ORAL

## 2022-12-28 MED ORDER — MULTIPLE VITAMINS PO TABS
Freq: Every day | ORAL | Status: DC
Start: 2022-12-28 — End: 2023-01-07
  Administered 2022-12-29 – 2023-01-07 (×10): 1 via ORAL

## 2022-12-28 MED ORDER — IMMUNE GLOBULIN (PRIVIGEN) 10%
Freq: Once | INTRAVENOUS | Status: AC
Start: 2022-12-28 — End: 2022-12-28
  Administered 2022-12-29: 30 g via INTRAVENOUS

## 2022-12-28 MED FILL — PRIVIGEN 20 GM/200ML IV SOLN: 20 GM/0ML | INTRAVENOUS | Qty: 200

## 2022-12-28 MED FILL — DEXAMETHASONE SODIUM PHOSPHATE 20 MG/5ML IJ SOLN: 20 MG/5ML | INTRAMUSCULAR | Qty: 10

## 2022-12-28 MED FILL — NORMAL SALINE FLUSH 0.9 % IV SOLN: 0.9 % | INTRAVENOUS | Qty: 10

## 2022-12-28 MED FILL — FAMOTIDINE 20 MG PO TABS: 20 MG | ORAL | Qty: 1

## 2022-12-28 MED FILL — NORMAL SALINE FLUSH 0.9 % IV SOLN: 0.9 % | INTRAVENOUS | Qty: 30

## 2022-12-28 MED FILL — ROSUVASTATIN CALCIUM 10 MG PO TABS: 10 MG | ORAL | Qty: 1

## 2022-12-28 MED FILL — POTASSIUM CHLORIDE 10 MEQ/100ML IV SOLN: 10 MEQ/0ML | INTRAVENOUS | Qty: 100

## 2022-12-28 NOTE — Plan of Care (Signed)
Problem: ABCDS Injury Assessment  Goal: Absence of physical injury  Outcome: Progressing     Problem: Pain  Goal: Verbalizes/displays adequate comfort level or baseline comfort level  Outcome: Progressing     Problem: Safety - Adult  Goal: Free from fall injury  Outcome: Progressing

## 2022-12-28 NOTE — Progress Notes (Signed)
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  He denies any new problems overnight.  He has not noticed any new bruising.  He denies any significant bleeding.        PHYSICAL EXAM  Vitals:    12/28/22 0624 12/28/22 0645 12/28/22 0736 12/28/22 1113   BP: 123/76 110/73 109/67 112/68   Pulse: 81 75 71 84   Resp: 18 18 18 16    Temp: 97.8 F (36.6 C) 97.4 F (36.3 C) 97.5 F (36.4 C) 97.7 F (36.5 C)   TempSrc: Oral  Oral Oral   SpO2: 96% 97% 97% 95%   Weight:       Height:       The patient is awake and alert.  He is pleasant and comfortable appearing.  Heart is irregular.  He is not tachycardic.  Work of breathing is normal.  Lungs seem clear.  Abdomen soft and nontender.  There is no foreleg edema.  He has petechiae involving both forelegs.  Family is at bedside.      LABS  CBC:   Lab Results   Component Value Date/Time    WBC 12.3 12/28/2022 05:08 AM    HGB 12.6 12/28/2022 05:08 AM    HCT 37.4 12/28/2022 05:08 AM    MCV 89.7 12/28/2022 05:08 AM    PLT 7 12/28/2022 05:08 AM     BMP:   Lab Results   Component Value Date/Time    NA 137 12/28/2022 05:08 AM    K 4.2 12/28/2022 05:08 AM    CL 105 12/28/2022 05:08 AM    CO2 21 12/28/2022 05:08 AM    BUN 19 12/28/2022 05:08 AM    CREATININE 0.7 12/28/2022 05:08 AM    CALCIUM 8.8 12/28/2022 05:08 AM         DIAGNOSTIC STUDIES        ASSESSMENT/PLAN    1.  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  Continue dexamethasone 40 mg IV daily with plan for 4 days of therapy (apparently he received first dose yesterday in [facility] ED).  He may require further IVIG dosing, and I will defer to Dr. Victory Dakin.  Repeat CBC tomorrow.    2.  Atrial fibrillation, paroxysmal versus permanent:  Anticoagulation has been stopped.  He indicates that his PCP had been planning to transition him from rivaroxaban back to apixaban (he had taken apixaban in the past).   Continue to avoid anticoagulation.  Restart atenolol for rate control.    Continue inpatient care.      Electronically signed by Adah Salvage, MD on 12/28/2022 at 3:29 PM

## 2022-12-28 NOTE — Progress Notes (Signed)
Progress Note            Date:12/28/2022        Patient Name:Luis Wilcox       Date of Birth:1941-05-15         Subjective:  Patient reports mouth blistering is decreased.  Mild sensitivity is improving.  Able to eat more.  Denies any signs or symptoms of bleeding.  Petechiae persist, slightly improved.  Platelet count improved to 18,000 overnight decreased to 7000.  Received repeat platelet transfusion this morning.    Medications:  Prior to Admission medications    Medication Sig Start Date End Date Taking? Authorizing Provider   alendronate (FOSAMAX) 70 MG tablet Take 1 tablet by mouth every 7 days Sunday   Yes [provider]   atenolol (TENORMIN) 50 MG tablet Take 1 tablet by mouth 2 times daily   Yes [provider]   clobetasol (TEMOVATE) 0.05 % cream Apply 1 each topically as needed (skin flares)   Yes [provider]   rivaroxaban (XARELTO) 20 MG TABS tablet Take 1 tablet by mouth daily   Yes [provider]   rosuvastatin (CRESTOR) 10 MG tablet Take 1 tablet by mouth every evening   Yes [provider]   tadalafil (CIALIS) 5 MG tablet Take 1-4 tablets by mouth daily as needed for Erectile Dysfunction   Yes [provider]   calcium carbonate (TUMS) 500 MG chewable tablet Take 1-2 tablets by mouth nightly as needed for Heartburn   Yes [provider]   Multiple Vitamins-Minerals (PRESERVISION AREDS 2) CAPS Take 1 capsule by mouth in the morning and at bedtime   Yes [provider]   Multiple Vitamin (MULTIVITAMIN) TABS tablet Take 1 tablet by mouth daily   Yes [provider]   Vitamin D (CHOLECALCIFEROL) 25 MCG (1000 UT) TABS tablet Take 1 tablet by mouth daily   Yes [provider]   Omega-3 Fatty Acids (FISH OIL) 1000 MG capsule Take 1 capsule by mouth daily   Yes [provider]   fluticasone (FLONASE) 50 MCG/ACT nasal spray 1 spray by Each Nostril route nightly as needed for Rhinitis or Allergies   Yes  [provider]        0.9 % sodium chloride infusion, PRN  atenolol (TENORMIN) tablet 50 mg, BID  [START ON 12/29/2022] multivitamin 1 tablet, Daily  rosuvastatin (CRESTOR) tablet 10 mg, QPM  [START ON 12/29/2022] Vitamin D (CHOLECALCIFEROL) tablet 1,000 Units, Daily  [START ON 12/29/2022] fish oil 1000 MG capsule 1,000 mg, Daily  [START ON 12/29/2022] antioxidant multivitamin (OCUVITE) tablet, Daily  sodium chloride flush 0.9 % injection 5-40 mL, 2 times per day  sodium chloride flush 0.9 % injection 5-40 mL, PRN  0.9 % sodium chloride infusion, PRN  ondansetron (ZOFRAN-ODT) disintegrating tablet 4 mg, Q8H PRN   Or  ondansetron (ZOFRAN) injection 4 mg, Q6H PRN  polyethylene glycol (GLYCOLAX) packet 17 g, Daily PRN  acetaminophen (TYLENOL) tablet 650 mg, Q6H PRN   Or  acetaminophen (TYLENOL) suppository 650 mg, Q6H PRN  dexAMETHasone (DECADRON) 40 mg in sodium chloride 0.9 % 50 mL IVPB, Daily  0.9 % sodium chloride infusion, PRN  famotidine (PEPCID) tablet 20 mg, BID        Physical Exam:  BP 120/71   Pulse 80   Temp 97.6 F (36.4 C) (Oral)   Resp 16   Ht 1.753 m (5\' 9" )   Wt 70 kg (154 lb 6.4 oz)   SpO2  96%   BMI 22.80 kg/m     GEN: Appears well, in no apparent distress.  HEENT: EOMI. No scleral icterus. Normocephalic, atraumatic.  Ecchymosis noted on tongue.  Overall mouth blistering has improved.  CV: Regular rate and rhythm. No murmurs rubs or gallops.  RESP: Breathing non-labored. Clear breath sounds bilaterally.  ABD: Soft, nontender, nondistended. No rebound or guarding. No hepatomegaly. No splenomegaly.  MSK: Normal strength and range of motion. No tenderness or swelling.  SKIN: Persistent petechiae on lower extremities.  NEURO: Alert, oriented x3. CN II - XII grossly intact.  PSYCH: Appropriate mood, normal affect.    Laboratory Data:  Recent Labs     12/27/22  1141 12/28/22  0117 12/28/22  0508   WBC 8.4 9.2 12.3*   RBC 4.83 4.16 4.17   HGB 14.9 12.5* 12.6*   HCT 43.7 37.5* 37.4*   MCV 90.5 90.1  89.7   RDW 13.0 13.0 12.9   PLT 2* 18* 7*     Recent Labs     12/27/22  1141 12/28/22  0508   NA 136 137   K 4.4 4.2   CL 104 105   CO2 21* 21*   BUN 18 19   CREATININE 0.8 0.7   GLUCOSE 125* 156*     Recent Labs     12/27/22  1141   AST 24   ALT 18   BILITOT 0.60   ALKPHOS 50       Recent Labs     12/27/22  1141   PROTIME 16.7*   INR 1.3*     No results for input(s): "APTT" in the last 72 hours.  No results found for: "FIBRINOGEN"   No results found for: "LABURIC", "URICACID"   No results found for: "LDH"     No results found for: "IRON", "TIBC", "FERRITIN"   No results found for: "VITAMINB12"   No results found for: "FOLATE"     No results found for: "SPEP", "UPEP"   No results found for: "KLC"   No results found for: "LLC"     Diagnostic Imaging:  No image results found.       ASSESSMENT:  Hospital Problems             Last Modified POA    * (Principal) Severe thrombocytopenia (HCC) 12/27/2022 Yes     1.  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  White blood cell count and hemoglobin are relatively normal.  Currently day #2 of dexamethasone 40 mg.  Plan for total 4 days.  He received IVIG x 1 on 12/27/2022.  Recommend repeat dosing today and I will order Privigen 400mg /kg IV.  Can repeat for up to 5 days depending on tolerance.  No signs of volume overload.  We can consider bone marrow aspiration and biopsy if platelet count remains low without clear etiology.  Consider rituximab versus TPO therapy in the coming days if platelets remain less than 10,000.    I will continue to follow along.   Please call with any questions [phone].    Orpah Melter, MD

## 2022-12-28 NOTE — Care Coordination-Inpatient (Signed)
12/28/22 0844   Service Assessment   Patient Orientation Alert and Oriented   Cognition Alert   History Provided By Patient   Primary Caregiver Self   Support Systems Spouse/Significant Other   Patient's Healthcare Decision Maker is: Named in Scanned ACP Document   PCP Verified by CM Yes  (In [place])   Prior Functional Level Independent in ADLs/IADLs   Current Functional Level Independent in ADLs/IADLs   Can patient return to prior living arrangement Yes   Ability to make needs known: Good   Family able to assist with home care needs: Yes   Would you like for me to discuss the discharge plan with any other family members/significant others, and if so, who? Yes  (Spouse)   Chief Executive Officer;Other (Comment)   Community Resources None   Social/Functional History   Lives With Spouse   Type of Home House   Receives Help From Family   ADL Assistance Independent   Homemaking Assistance Independent   Ambulation Assistance Independent   Transfer Assistance Independent   Active Driver Yes   Mode of Engineer, water   Occupation Full time employment   Discharge Planning   Type of Residence House   Living Arrangements Spouse/Significant Other   Current Services Prior To Admission C-pap   Potential Assistance Needed N/A   DME Ordered? No   Potential Assistance Purchasing Medications No   Type of Home Care Services None   Patient expects to be discharged to: House   History of falls? 0   Services At/After Discharge   Transition of Care Consult (CM Consult) N/A   Services At/After Discharge None   [employer] Information Provided? Yes   Mode of Transport at Discharge Self   Confirm Follow Up Transport Family   Condition of Participation: Discharge Planning   The Plan for Transition of Care is related to the following treatment goals: Home

## 2022-12-29 ENCOUNTER — Inpatient Hospital Stay: Admit: 2022-12-29 | Payer: MEDICARE

## 2022-12-29 LAB — CBC WITH AUTO DIFFERENTIAL
Basophils %: 0.1 % (ref 0.0–2.0)
Basophils %: 0.1 % (ref 0.0–2.0)
Basophils Absolute: 0 10*3/uL (ref 0.0–0.2)
Basophils Absolute: 0 10*3/uL (ref 0.0–0.2)
Eosinophils %: 0 % (ref 0.0–7.0)
Eosinophils %: 0 % (ref 0.0–7.0)
Eosinophils Absolute: 0 10*3/uL (ref 0.0–0.5)
Eosinophils Absolute: 0 10*3/uL (ref 0.0–0.5)
Hematocrit: 38.5 % (ref 38.0–52.0)
Hematocrit: 39.1 % (ref 38.0–52.0)
Hemoglobin: 12.8 g/dL — ABNORMAL LOW (ref 13.0–17.3)
Hemoglobin: 12.9 g/dL — ABNORMAL LOW (ref 13.0–17.3)
Immature Grans (Abs): 0.11 10*3/uL — ABNORMAL HIGH (ref 0.00–0.06)
Immature Grans (Abs): 0.1 10*3/uL — ABNORMAL HIGH (ref 0.00–0.06)
Immature Granulocytes %: 0.6 % (ref 0.0–0.6)
Immature Granulocytes %: 0.6 % (ref 0.0–0.6)
Lymphocytes Absolute: 0.9 10*3/uL — ABNORMAL LOW (ref 1.0–3.2)
Lymphocytes Absolute: 0.9 10*3/uL — ABNORMAL LOW (ref 1.0–3.2)
Lymphocytes: 5 % — ABNORMAL LOW (ref 15.0–45.0)
Lymphocytes: 5.4 % — ABNORMAL LOW (ref 15.0–45.0)
MCH: 30 pg (ref 27.0–34.5)
MCH: 29.9 pg (ref 27.0–34.5)
MCHC: 33.2 g/dL (ref 32.0–36.0)
MCHC: 33 g/dL (ref 32.0–36.0)
MCV: 90.4 fL (ref 84.0–100.0)
MCV: 90.5 fL (ref 84.0–100.0)
Monocytes %: 4.9 % (ref 4.0–12.0)
Monocytes %: 4.7 % (ref 4.0–12.0)
Monocytes Absolute: 0.9 10*3/uL (ref 0.3–1.0)
Monocytes Absolute: 0.8 10*3/uL (ref 0.3–1.0)
NRBC Absolute: 0 10*3/uL (ref 0.000–0.012)
NRBC Absolute: 0 10*3/uL (ref 0.000–0.012)
NRBC Automated: 0 % (ref 0.0–0.2)
NRBC Automated: 0 % (ref 0.0–0.2)
Neutrophils %: 89.4 % — ABNORMAL HIGH (ref 42.0–74.0)
Neutrophils %: 89.2 % — ABNORMAL HIGH (ref 42.0–74.0)
Neutrophils Absolute: 16.6 10*3/uL — ABNORMAL HIGH (ref 1.6–7.3)
Neutrophils Absolute: 15.1 10*3/uL — ABNORMAL HIGH (ref 1.6–7.3)
Platelets: 2 10*3/uL — CL (ref 140–440)
Platelets: 2 10*3/uL — CL (ref 140–440)
RBC: 4.26 x10e6/mcL (ref 4.00–5.60)
RBC: 4.32 x10e6/mcL (ref 4.00–5.60)
RDW: 13.2 % (ref 11.0–16.0)
RDW: 13.4 % (ref 11.0–16.0)
WBC: 18.5 10*3/uL — ABNORMAL HIGH (ref 3.8–10.6)
WBC: 16.9 10*3/uL — ABNORMAL HIGH (ref 3.8–10.6)

## 2022-12-29 LAB — IMMATURE PLATELET FRACTION
Immature Platelet, Absolute: 0.2 10*3/uL
Immature Platelet, Absolute: 0.2 10*3/uL
Immature Platelet, Percent: 22.6 % — ABNORMAL HIGH (ref 1.2–8.6)
Immature Platelet, Percent: 9.4 % — ABNORMAL HIGH (ref 1.2–8.6)

## 2022-12-29 LAB — PREPARE PLATELETS
Blood Bank ISBT Product Blood Type: 2800
Expiration Date: 202405102359

## 2022-12-29 LAB — LACTATE DEHYDROGENASE: LD: 173 U/L (ref 135–225)

## 2022-12-29 LAB — PROTIME-INR
INR: 1.2 — ABNORMAL LOW (ref 1.5–3.5)
Protime: 14.9 seconds — ABNORMAL HIGH (ref 11.6–14.5)

## 2022-12-29 LAB — HAPTOGLOBIN: Haptoglobin: 121 mg/dL (ref 30.0–200.0)

## 2022-12-29 LAB — APTT: APTT: 24.4 seconds (ref 23.3–34.5)

## 2022-12-29 MED ORDER — IMMUNE GLOBULIN (PRIVIGEN) 10%
Freq: Once | INTRAVENOUS | Status: AC
Start: 2022-12-29 — End: 2022-12-29
  Administered 2022-12-30: 30 g via INTRAVENOUS

## 2022-12-29 MED ORDER — PANTOPRAZOLE SODIUM 40 MG IV SOLR
40 | Freq: Two times a day (BID) | INTRAVENOUS | Status: DC
Start: 2022-12-29 — End: 2023-01-07
  Administered 2022-12-30 – 2023-01-07 (×18): 40 mg via INTRAVENOUS

## 2022-12-29 MED ORDER — SODIUM CHLORIDE 0.9 % IV SOLN
0.9 | INTRAVENOUS | Status: DC | PRN
Start: 2022-12-29 — End: 2022-12-30

## 2022-12-29 MED FILL — FAMOTIDINE 20 MG PO TABS: 20 MG | ORAL | Qty: 1

## 2022-12-29 MED FILL — THERA PO TABS: ORAL | Qty: 1

## 2022-12-29 MED FILL — CEROVITE SENIOR PO TABS: ORAL | Qty: 1

## 2022-12-29 MED FILL — FISH OIL 1000 MG PO CAPS: 1000 MG | ORAL | Qty: 1

## 2022-12-29 MED FILL — ATENOLOL 25 MG PO TABS: 25 MG | ORAL | Qty: 2

## 2022-12-29 MED FILL — ROSUVASTATIN CALCIUM 10 MG PO TABS: 10 MG | ORAL | Qty: 1

## 2022-12-29 MED FILL — NORMAL SALINE FLUSH 0.9 % IV SOLN: 0.9 % | INTRAVENOUS | Qty: 10

## 2022-12-29 MED FILL — PRIVIGEN 20 GM/200ML IV SOLN: 20 GM/0ML | INTRAVENOUS | Qty: 200

## 2022-12-29 MED FILL — VITAMIN D3 25 MCG (1000 UT) PO TABS: 25 MCG (1000 UT) | ORAL | Qty: 1

## 2022-12-29 MED FILL — DEXAMETHASONE SODIUM PHOSPHATE 20 MG/5ML IJ SOLN: 20 MG/5ML | INTRAMUSCULAR | Qty: 10

## 2022-12-29 NOTE — Progress Notes (Signed)
[facility] Hospitalist Service    PROGRESS NOTE  Luis Wilcox   12/29/22     SUBJECTIVE  This is a follow-up visit for severe thrombocytopenia.  He noticed bruising on his right elbow after simply raising himself up on his elbows in bed.  Small red dots on his legs seems similar to yesterday on his inspection.  He did notice yesterday that he had some dark discoloration to stool.  He typically has 1 bowel movement per day.  Today his bowel movements seem mostly brown, but there was an aspect to it that appeared dark or black.  He has no history of GI bleeding.        PHYSICAL EXAM  Vitals:    12/29/22 1115 12/29/22 1200 12/29/22 1308 12/29/22 1315   BP: 133/85 139/87 (!) 146/82 (!) 146/82   Pulse: 72 83 85 85   Resp: 16 16 16 16    Temp: 97.5 F (36.4 C) 98.1 F (36.7 C) 97.9 F (36.6 C) 97.9 F (36.6 C)   TempSrc:  Oral Oral    SpO2: 98% 97% 96% 96%   Weight:       Height:       The patient is awake and alert.  He is comfortable appearing.  Heart is regular.  Breathing is normal.  Lungs seem clear.  Abdomen soft and nontender.  He has petechiae on his lower extremities.  He has ecchymoses over his right elbow.      LABS  CBC:   Lab Results   Component Value Date/Time    WBC 16.9 12/29/2022 08:34 AM    HGB 12.9 12/29/2022 08:34 AM    HCT 39.1 12/29/2022 08:34 AM    MCV 90.5 12/29/2022 08:34 AM    PLT 2 12/29/2022 08:34 AM     BMP:   Lab Results   Component Value Date/Time    NA 137 12/28/2022 05:08 AM    K 4.2 12/28/2022 05:08 AM    CL 105 12/28/2022 05:08 AM    CO2 21 12/28/2022 05:08 AM    BUN 19 12/28/2022 05:08 AM    CREATININE 0.7 12/28/2022 05:08 AM    CALCIUM 8.8 12/28/2022 05:08 AM         DIAGNOSTIC STUDIES        ASSESSMENT/PLAN  1.  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  He has not responded to dexamethasone and IVIG, however.  Repeat CBC tomorrow.  He may require rituximab.  I appreciate the help of Dr. Victory Dakin.  She notes that further platelet transfusion is probably unhelpful, unless there are signs  of bleeding.    2.  Stool discoloration:  He is certainly at risk for significant bleeding with his thrombocytopenia.  I spoke with him and his bedside nurse in detail.  Hemoccult testing is probably of limited clinical value (apparently a test was done earlier this hospital stay which was positive).  The patient knows to save any bowel movements for nurse inspection.  I have asked nursing staff to page me if he has red or black stool.  I will place the patient on IV PPI empirically pending clinical course.    3.  Atrial fibrillation:  He may be in sinus rhythm.  Avoid anticoagulation due to thrombocytopenia.  Rate is controlled.  Telemetry is not needed.    Continue inpatient care.        Electronically signed by Adah Salvage, MD on 12/29/2022 at 3:44 PM

## 2022-12-29 NOTE — Plan of Care (Signed)
Continue with safety protocals

## 2022-12-29 NOTE — Progress Notes (Signed)
Progress Note            Date:12/29/2022        Patient Name:Luis Wilcox       Date of Birth:[birth year]         Subjective:  He received IVIG after I saw him yesterday evening without difficulty. He reports he had "black" stool with bowel movement yesterday.     Medications:  Prior to Admission medications    Medication Sig Start Date End Date Taking? Authorizing Provider   alendronate (FOSAMAX) 70 MG tablet Take 1 tablet by mouth every 7 days Sunday   Yes [provider]   atenolol (TENORMIN) 50 MG tablet Take 1 tablet by mouth 2 times daily   Yes [provider]   clobetasol (TEMOVATE) 0.05 % cream Apply 1 each topically as needed (skin flares)   Yes [provider]   rivaroxaban (XARELTO) 20 MG TABS tablet Take 1 tablet by mouth daily   Yes [provider]   rosuvastatin (CRESTOR) 10 MG tablet Take 1 tablet by mouth every evening   Yes [provider]   tadalafil (CIALIS) 5 MG tablet Take 1-4 tablets by mouth daily as needed for Erectile Dysfunction   Yes [provider]   calcium carbonate (TUMS) 500 MG chewable tablet Take 1-2 tablets by mouth nightly as needed for Heartburn   Yes [provider]   Multiple Vitamins-Minerals (PRESERVISION AREDS 2) CAPS Take 1 capsule by mouth in the morning and at bedtime   Yes [provider]   Multiple Vitamin (MULTIVITAMIN) TABS tablet Take 1 tablet by mouth daily   Yes [provider]   Vitamin D (CHOLECALCIFEROL) 25 MCG (1000 UT) TABS tablet Take 1 tablet by mouth daily   Yes [provider]   Omega-3 Fatty Acids (FISH OIL) 1000 MG capsule Take 1 capsule by mouth daily   Yes [provider]   fluticasone (FLONASE) 50 MCG/ACT nasal spray 1 spray by Each Nostril route nightly as needed for Rhinitis or Allergies   Yes [provider]        0.9 % sodium chloride infusion, PRN  immune globulin (PRIVIGEN) 10% solution 30,000 mg, Once  atenolol (TENORMIN) tablet 50 mg,  BID  multivitamin 1 tablet, Daily  Vitamin D (CHOLECALCIFEROL) tablet 1,000 Units, Daily  fish oil 1000 MG capsule 1,000 mg, Daily  antioxidant multivitamin (OCUVITE) tablet, Daily  rosuvastatin (CRESTOR) tablet 10 mg, Nightly  sodium chloride flush 0.9 % injection 5-40 mL, 2 times per day  sodium chloride flush 0.9 % injection 5-40 mL, PRN  0.9 % sodium chloride infusion, PRN  ondansetron (ZOFRAN-ODT) disintegrating tablet 4 mg, Q8H PRN   Or  ondansetron (ZOFRAN) injection 4 mg, Q6H PRN  polyethylene glycol (GLYCOLAX) packet 17 g, Daily PRN  acetaminophen (TYLENOL) tablet 650 mg, Q6H PRN   Or  acetaminophen (TYLENOL) suppository 650 mg, Q6H PRN  dexAMETHasone (DECADRON) 40 mg in sodium chloride 0.9 % 50 mL IVPB, Daily  famotidine (PEPCID) tablet 20 mg, BID        Physical Exam:  BP 129/89   Pulse 76   Temp 97.5 F (36.4 C) (Oral)   Resp 16   Ht 1.753 m (5\' 9" )   Wt 70 kg (154 lb 6.4 oz)   SpO2 96%   BMI 22.80 kg/m     GEN: Appears well, in no apparent distress.  HEENT: EOMI. No scleral icterus. Normocephalic, atraumatic.  Ecchymosis noted on tongue.  Overall mouth blistering  has improved.  CV: Regular rate and rhythm. No murmurs rubs or gallops.  RESP: Breathing non-labored. Clear breath sounds bilaterally.  ABD: Soft, nontender, nondistended. No rebound or guarding. No hepatomegaly. No splenomegaly.  MSK: Normal strength and range of motion. No tenderness or swelling.  SKIN: Persistent petechiae on lower extremities.  NEURO: Alert, oriented x3. CN II - XII grossly intact.  PSYCH: Appropriate mood, normal affect.    Laboratory Data:  Recent Labs     12/28/22  0117 12/28/22  0508 12/29/22  0533   WBC 9.2 12.3* 18.5*   RBC 4.16 4.17 4.26   HGB 12.5* 12.6* 12.8*   HCT 37.5* 37.4* 38.5   MCV 90.1 89.7 90.4   RDW 13.0 12.9 13.2   PLT 18* 7* 2*     Recent Labs     12/27/22  1141 12/28/22  0508   NA 136 137   K 4.4 4.2   CL 104 105   CO2 21* 21*   BUN 18 19   CREATININE 0.8 0.7   GLUCOSE 125* 156*     Recent Labs      12/27/22  1141   AST 24   ALT 18   BILITOT 0.60   ALKPHOS 50       Recent Labs     12/27/22  1141 12/29/22  0834   PROTIME 16.7* 14.9*   INR 1.3* 1.2*     Recent Labs     12/29/22  0834   APTT 24.4     No results found for: "FIBRINOGEN"   No results found for: "LABURIC", "URICACID"   Lab Results   Component Value Date    LDH 173 12/29/2022        No results found for: "IRON", "TIBC", "FERRITIN"   No results found for: "VITAMINB12"   No results found for: "FOLATE"     No results found for: "SPEP", "UPEP"   No results found for: "KLC"   No results found for: "LLC"     Diagnostic Imaging:  No image results found.       ASSESSMENT:  Hospital Problems             Last Modified POA    * (Principal) Severe thrombocytopenia (HCC) 12/27/2022 Yes     1.  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 Not responding to steroids or IVIG thus far. Continue with treatment course, dose #3 of dexamethasone 40 mg today.  Will order dose #3 of IVIG 400 mg/kg.  Will check additional labs today including haptoglobin, LDH, INR PT, APTT.  Will also order abdominal ultrasound to evaluate liver and spleen to rule out any abnormalities.  If platelets remain less than 10,000 tomorrow, we discussed proceeding with rituximab 375 mg/m2.  We reviewed potential side effects.  Baseline hepatitis panel negative.  This will be given on a weekly basis for 4 weeks depending on response.  Consider bone marrow aspiration biopsy in the future if thrombocytopenia persist without clear cause.  I do not necessarily think he is responding to platelet transfusions.  Can hold for now unless active bleeding arises.  2.  Possible upper GI bleed: He reports dark stool yesterday.  Hemoglobin is stable at 12.8.  Continue to monitor closely.  3.  History of paroxysmal atrial fibrillation: Anticoagulation held on admission.  Continue to hold with severe  thrombocytopenia.    I will continue to follow along.  Please call with any questions [phone].    Orpah Melter, MD

## 2022-12-30 LAB — CBC WITH AUTO DIFFERENTIAL
Basophils %: 0.1 % (ref 0.0–2.0)
Basophils Absolute: 0 10*3/uL (ref 0.0–0.2)
Eosinophils %: 0 % (ref 0.0–7.0)
Eosinophils Absolute: 0 10*3/uL (ref 0.0–0.5)
Hematocrit: 36 % — ABNORMAL LOW (ref 38.0–52.0)
Hemoglobin: 12 g/dL — ABNORMAL LOW (ref 13.0–17.3)
Immature Grans (Abs): 0.1 10*3/uL — ABNORMAL HIGH (ref 0.00–0.06)
Immature Granulocytes %: 0.6 % (ref 0.0–0.6)
Lymphocytes Absolute: 1 10*3/uL (ref 1.0–3.2)
Lymphocytes: 6.2 % — ABNORMAL LOW (ref 15.0–45.0)
MCH: 30 pg (ref 27.0–34.5)
MCHC: 33.3 g/dL (ref 32.0–36.0)
MCV: 90 fL (ref 84.0–100.0)
Monocytes %: 5.5 % (ref 4.0–12.0)
Monocytes Absolute: 0.9 10*3/uL (ref 0.3–1.0)
NRBC Absolute: 0 10*3/uL (ref 0.000–0.012)
NRBC Automated: 0 % (ref 0.0–0.2)
Neutrophils %: 87.6 % — ABNORMAL HIGH (ref 42.0–74.0)
Neutrophils Absolute: 13.8 10*3/uL — ABNORMAL HIGH (ref 1.6–7.3)
Platelets: 1 10*3/uL — CL (ref 140–440)
RBC: 4 x10e6/mcL (ref 4.00–5.60)
RDW: 13.5 % (ref 11.0–16.0)
WBC: 15.7 10*3/uL — ABNORMAL HIGH (ref 3.8–10.6)

## 2022-12-30 LAB — BASIC METABOLIC PANEL
Anion Gap: 8 mmol/L (ref 2–17)
BUN: 26 mg/dL — ABNORMAL HIGH (ref 8–23)
CO2: 22 mmol/L (ref 22–29)
Calcium: 8.4 mg/dL — ABNORMAL LOW (ref 8.5–10.7)
Chloride: 108 mmol/L — ABNORMAL HIGH (ref 98–107)
Creatinine: 0.8 mg/dL (ref 0.7–1.3)
Est, Glom Filt Rate: 88 mL/min/1.73m (ref >=60)
Glucose: 122 mg/dL — ABNORMAL HIGH (ref 70–99)
Osmolaliy Calculated: 282 mOsm/kg (ref 270–287)
Potassium: 4.2 mmol/L (ref 3.5–5.3)
Sodium: 138 mmol/L (ref 135–145)

## 2022-12-30 LAB — PREPARE PLATELETS

## 2022-12-30 LAB — IMMATURE PLATELET FRACTION
Immature Platelet, Absolute: 0 10*3/uL
Immature Platelet, Percent: 1.9 % (ref 1.2–8.6)

## 2022-12-30 LAB — VITAMIN B12: Vitamin B-12: 362 pg/mL (ref 232–1245)

## 2022-12-30 LAB — ANA SCREEN WITH REFLEX: ANA, Direct: NEGATIVE

## 2022-12-30 MED ORDER — HEPARIN NA (PORK) LOCK FLSH PF 100 UNIT/ML IV SOLN
100 | INTRAVENOUS | Status: AC | PRN
Start: 2022-12-30 — End: 2022-12-31

## 2022-12-30 MED ORDER — GRANISETRON HCL 1 MG/ML IV SOLN
1 | Freq: Once | INTRAVENOUS | Status: AC | PRN
Start: 2022-12-30 — End: 2022-12-31

## 2022-12-30 MED ORDER — LIDOCAINE HCL (PF) 1 % IJ SOLN
1 | Freq: Once | INTRAMUSCULAR | Status: AC | PRN
Start: 2022-12-30 — End: 2022-12-31

## 2022-12-30 MED ORDER — NORMAL SALINE FLUSH 0.9 % IV SOLN
0.9 | INTRAVENOUS | Status: AC | PRN
Start: 2022-12-30 — End: 2022-12-31
  Administered 2022-12-30: 18:00:00 10 mL via INTRAVENOUS

## 2022-12-30 MED ORDER — SODIUM CHLORIDE (PF) 0.9 % IJ SOLN
0.9 | Freq: Once | INTRAMUSCULAR | Status: AC | PRN
Start: 2022-12-30 — End: 2022-12-31

## 2022-12-30 MED ORDER — SODIUM CHLORIDE 0.9 % IV SOLN
0.9 | Freq: Once | INTRAVENOUS | Status: AC
Start: 2022-12-30 — End: 2022-12-30
  Administered 2022-12-30: 18:00:00 700 mg/m2 via INTRAVENOUS

## 2022-12-30 MED ORDER — ONDANSETRON HCL 4 MG/2ML IJ SOLN
4 | Freq: Once | INTRAMUSCULAR | Status: AC | PRN
Start: 2022-12-30 — End: 2022-12-31

## 2022-12-30 MED ORDER — METHYLPREDNISOLONE NA SUC (PF) 125 MG IJ SOLR
125 | Freq: Once | INTRAMUSCULAR | Status: DC
Start: 2022-12-30 — End: 2022-12-30

## 2022-12-30 MED ORDER — ACETAMINOPHEN 325 MG PO TABS
325 | Freq: Once | ORAL | Status: AC | PRN
Start: 2022-12-30 — End: 2022-12-31

## 2022-12-30 MED ORDER — HYDROCORTISONE SOD SUC (PF) 100 MG IJ SOLR
100 | Freq: Once | INTRAMUSCULAR | Status: AC | PRN
Start: 2022-12-30 — End: 2022-12-31

## 2022-12-30 MED ORDER — SODIUM CHLORIDE 0.9 % IV SOLN
0.9 | INTRAVENOUS | Status: AC | PRN
Start: 2022-12-30 — End: 2022-12-31

## 2022-12-30 MED ORDER — MEPERIDINE HCL 25 MG/ML IJ SOLN
25 | INTRAMUSCULAR | Status: DC | PRN
Start: 2022-12-30 — End: 2023-01-07

## 2022-12-30 MED ORDER — SODIUM CHLORIDE 0.9 % IV SOLN
0.9 | INTRAVENOUS | Status: AC
Start: 2022-12-30 — End: 2022-12-31

## 2022-12-30 MED ORDER — SODIUM CHLORIDE 0.9 % IV SOLN
0.9 | INTRAVENOUS | Status: DC | PRN
Start: 2022-12-30 — End: 2022-12-31

## 2022-12-30 MED ORDER — EPINEPHRINE (ANAPHYLAXIS) 1 MG/ML IJ SOLN
1 MG/ML | INTRAMUSCULAR | Status: DC | PRN
Start: 2022-12-30 — End: 2023-01-07

## 2022-12-30 MED ORDER — ALTEPLASE 2 MG IJ SOLR
2 MG | INTRAMUSCULAR | Status: AC | PRN
Start: 2022-12-30 — End: 2022-12-31

## 2022-12-30 MED ORDER — ALBUTEROL SULFATE HFA 108 (90 BASE) MCG/ACT IN AERS
108 | RESPIRATORY_TRACT | Status: AC | PRN
Start: 2022-12-30 — End: 2022-12-31

## 2022-12-30 MED ORDER — ACETAMINOPHEN 325 MG PO TABS
325 | Freq: Once | ORAL | Status: AC
Start: 2022-12-30 — End: 2022-12-30
  Administered 2022-12-30: 17:00:00 650 mg via ORAL

## 2022-12-30 MED ORDER — DIPHENHYDRAMINE HCL 50 MG/ML IJ SOLN
50 | Freq: Once | INTRAMUSCULAR | Status: AC | PRN
Start: 2022-12-30 — End: 2022-12-30
  Administered 2022-12-30: 17:00:00 50 mg via INTRAVENOUS

## 2022-12-30 MED ORDER — DIPHENHYDRAMINE HCL 50 MG/ML IJ SOLN
50 | Freq: Once | INTRAMUSCULAR | Status: DC
Start: 2022-12-30 — End: 2023-01-07

## 2022-12-30 MED FILL — TRUXIMA 500 MG/50ML IV SOLN: 500 MG/50ML | INTRAVENOUS | Qty: 50

## 2022-12-30 MED FILL — NORMAL SALINE FLUSH 0.9 % IV SOLN: 0.9 % | INTRAVENOUS | Qty: 10

## 2022-12-30 MED FILL — DIPHENHYDRAMINE HCL 50 MG/ML IJ SOLN: 50 MG/ML | INTRAMUSCULAR | Qty: 1

## 2022-12-30 MED FILL — THERA PO TABS: ORAL | Qty: 1

## 2022-12-30 MED FILL — FISH OIL 1000 MG PO CAPS: 1000 MG | ORAL | Qty: 1

## 2022-12-30 MED FILL — TYLENOL 325 MG PO TABS: 325 MG | ORAL | Qty: 2

## 2022-12-30 MED FILL — ATENOLOL 25 MG PO TABS: 25 MG | ORAL | Qty: 2

## 2022-12-30 MED FILL — CEROVITE SENIOR PO TABS: ORAL | Qty: 1

## 2022-12-30 MED FILL — PROTONIX 40 MG IV SOLR: 40 MG | INTRAVENOUS | Qty: 40

## 2022-12-30 MED FILL — VITAMIN D3 25 MCG (1000 UT) PO TABS: 25 MCG (1000 UT) | ORAL | Qty: 1

## 2022-12-30 MED FILL — NORMAL SALINE FLUSH 0.9 % IV SOLN: 0.9 % | INTRAVENOUS | Qty: 30

## 2022-12-30 MED FILL — ROSUVASTATIN CALCIUM 10 MG PO TABS: 10 MG | ORAL | Qty: 1

## 2022-12-30 MED FILL — DEXAMETHASONE SODIUM PHOSPHATE 20 MG/5ML IJ SOLN: 20 MG/5ML | INTRAMUSCULAR | Qty: 10

## 2022-12-30 NOTE — Progress Notes (Signed)
Progress Note            Date:12/30/2022        Patient Name:Luis Wilcox       Date of Birth:[birth year]         Subjective:  Platelet count further declined to 1000 today.  He had a dark bowel movement yesterday morning.  Otherwise denies active bleeding.  Petechiae.  Denies confusion chest pain or dyspnea.    Medications:  Prior to Admission medications    Medication Sig Start Date End Date Taking? Authorizing Provider   alendronate (FOSAMAX) 70 MG tablet Take 1 tablet by mouth every 7 days Sunday   Yes [provider]   atenolol (TENORMIN) 50 MG tablet Take 1 tablet by mouth 2 times daily   Yes [provider]   clobetasol (TEMOVATE) 0.05 % cream Apply 1 each topically as needed (skin flares)   Yes [provider]   rivaroxaban (XARELTO) 20 MG TABS tablet Take 1 tablet by mouth daily   Yes [provider]   rosuvastatin (CRESTOR) 10 MG tablet Take 1 tablet by mouth every evening   Yes [provider]   tadalafil (CIALIS) 5 MG tablet Take 1-4 tablets by mouth daily as needed for Erectile Dysfunction   Yes [provider]   calcium carbonate (TUMS) 500 MG chewable tablet Take 1-2 tablets by mouth nightly as needed for Heartburn   Yes [provider]   Multiple Vitamins-Minerals (PRESERVISION AREDS 2) CAPS Take 1 capsule by mouth in the morning and at bedtime   Yes [provider]   Multiple Vitamin (MULTIVITAMIN) TABS tablet Take 1 tablet by mouth daily   Yes [provider]   Vitamin D (CHOLECALCIFEROL) 25 MCG (1000 UT) TABS tablet Take 1 tablet by mouth daily   Yes [provider]   Omega-3 Fatty Acids (FISH OIL) 1000 MG capsule Take 1 capsule by mouth daily   Yes [provider]   fluticasone (FLONASE) 50 MCG/ACT nasal spray 1 spray by Each Nostril route nightly as needed for Rhinitis or Allergies   Yes [provider]        0.9 % sodium chloride infusion, PRN  0.9 % sodium chloride infusion,  PRN  pantoprazole (PROTONIX) injection 40 mg, BID  atenolol (TENORMIN) tablet 50 mg, BID  multivitamin 1 tablet, Daily  Vitamin D (CHOLECALCIFEROL) tablet 1,000 Units, Daily  fish oil 1000 MG capsule 1,000 mg, Daily  antioxidant multivitamin (OCUVITE) tablet, Daily  rosuvastatin (CRESTOR) tablet 10 mg, Nightly  sodium chloride flush 0.9 % injection 5-40 mL, 2 times per day  sodium chloride flush 0.9 % injection 5-40 mL, PRN  0.9 % sodium chloride infusion, PRN  ondansetron (ZOFRAN-ODT) disintegrating tablet 4 mg, Q8H PRN   Or  ondansetron (ZOFRAN) injection 4 mg, Q6H PRN  polyethylene glycol (GLYCOLAX) packet 17 g, Daily PRN  acetaminophen (TYLENOL) tablet 650 mg, Q6H PRN   Or  acetaminophen (TYLENOL) suppository 650 mg, Q6H PRN  dexAMETHasone (DECADRON) 40 mg in sodium chloride 0.9 % 50 mL IVPB, Daily        Physical Exam:  BP 111/76   Pulse 76   Temp 97.6 F (36.4 C) (Oral)   Resp 18   Ht 1.753 m (5\' 9" )   Wt 70 kg (154 lb 6.4 oz)   SpO2 97%   BMI 22.80 kg/m     GEN: Appears well, in no apparent distress.  HEENT: EOMI. No scleral icterus. Normocephalic, atraumatic.  Ecchymosis noted  on tongue.  Overall mouth blistering has improved.  CV: Regular rate and rhythm. No murmurs rubs or gallops.  RESP: Breathing non-labored. Clear breath sounds bilaterally.  ABD: Soft, nontender, nondistended. No rebound or guarding. No hepatomegaly. No splenomegaly.  MSK: Normal strength and range of motion. No tenderness or swelling.  SKIN: Persistent petechiae on lower extremities.  NEURO: Alert, oriented x3. CN II - XII grossly intact.  PSYCH: Appropriate mood, normal affect.    Laboratory Data:  Recent Labs     12/29/22  0533 12/29/22  0834 12/30/22  0520   WBC 18.5* 16.9* 15.7*   RBC 4.26 4.32 4.00   HGB 12.8* 12.9* 12.0*   HCT 38.5 39.1 36.0*   MCV 90.4 90.5 90.0   RDW 13.2 13.4 13.5   PLT 2* 2* 1*     Recent Labs     12/27/22  1141 12/28/22  0508 12/30/22  0520   NA 136 137 138   K 4.4 4.2 4.2   CL 104 105 108*   CO2  21* 21* 22   BUN 18 19 26*   CREATININE 0.8 0.7 0.8   GLUCOSE 125* 156* 122*     Recent Labs     12/27/22  1141   AST 24   ALT 18   BILITOT 0.60   ALKPHOS 50       Recent Labs     12/27/22  1141 12/29/22  0834   PROTIME 16.7* 14.9*   INR 1.3* 1.2*     Recent Labs     12/29/22  0834   APTT 24.4     No results found for: "FIBRINOGEN"   No results found for: "LABURIC", "URICACID"   Lab Results   Component Value Date    LDH 173 12/29/2022        No results found for: "IRON", "TIBC", "FERRITIN"   Lab Results   Component Value Date    VITAMINB12 362 12/30/2022      No results found for: "FOLATE"     No results found for: "SPEP", "UPEP"   No results found for: "KLC"   No results found for: "LLC"     Diagnostic Imaging:  US ABDOMEN COMPLETE  Narrative: Complete abdominal ultrasound: 12/29/22    INDICATION: "thrombocytopenia, evaluate for any abnormalities with liver or   spleen".    COMPARISON:    TECHNIQUE: Multiple gray-scale and color Doppler images of the abdomen.    FINDINGS:  The gallbladder is normal, no gallstones. The gallbladder wall measures 2 mm in   thickness.  CBD 2 mm cm, CHD 3.2 mm cm.  Visualized portions of the pancreas are normal. The liver is normal in size and   echotexture, measuring 14.5 cm cm.   The vena cava is patent Aorta is normal 2.1 cm proximally, 1.8 cm mid, 1.0 cm   distally.    The right kidney Is normal in echogenicity, Normal renal collecting system, no   hydronephrosis., renal diameter 9.9 cm. Small 1 cm cyst, anechoic.  The left kidney Is normal in echogenicity, Normal collecting system, no   hydronephrosis.. Renal diameter is 1.5 cm.  The spleen is normal in appearance, measuring 9.4 in diameter.  Impression: 1. Normal abdominal ultrasound.   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  HIV and hepatitis screen negative.  ANA and H. pylori antigen pending.  Hemolysis labs negative.  Platelet count does not appear to be responding to standard therapy with high-dose steroids/IVIG.  He will receive fourth day of dexamethasone 40 mg today.  I have recommended moving forward with rituximab today 375 mg/m2.  Reviewed potential toxicities of treatment.  This will be continued on a weekly basis if effective.  Consider TPO agonist therapy if platelet count is not improving in the coming days after rituximab.  2.  History of paroxysmal atrial fibrillation: Anticoagulation held on admission.  Continue to hold with severe thrombocytopenia.    I will continue to follow along.   Please call with any questions [phone].    Orpah Melter, MD

## 2022-12-30 NOTE — Progress Notes (Signed)
[facility] Hospitalist Service    PROGRESS NOTE  Luis Wilcox   12/30/22     SUBJECTIVE  This is a follow-up visit for severe thrombocytopenia.  He feels about the same as yesterday.  He has not noticed any new bruising.  He notices no new skin lesions.  He has not had a bowel movement since yesterday.  He expects that he will have a usual bowel movement following breakfast today.  He is not having dizziness or shortness of breath.        PHYSICAL EXAM  Vitals:    12/29/22 2329 12/30/22 0359 12/30/22 0820 12/30/22 0902   BP: (!) 117/55 111/76 111/76 (!) 144/82   Pulse: 67 76  66   Resp: 16 18  16    Temp: 97.6 F (36.4 C) 97.6 F (36.4 C)  97.6 F (36.4 C)   TempSrc: Oral Oral  Oral   SpO2: 97% 97%  97%   Weight:       Height:       The patient is awake and alert.  He is talkative.  He is pleasant and fairly nontoxic appearing.  Heart is regular.  Work of breathing is normal.  Hear no rales or wheezes.  Abdomen is soft and nontender.  He has no foreleg edema.  He has petechia on both forelegs, similar to yesterday.  He has some ecchymoses on his upper extremities, especially his right elbow.  This is similar to yesterday.      LABS  CBC:   Lab Results   Component Value Date/Time    WBC 15.7 12/30/2022 05:20 AM    HGB 12.0 12/30/2022 05:20 AM    HCT 36.0 12/30/2022 05:20 AM    MCV 90.0 12/30/2022 05:20 AM    PLT 1 12/30/2022 05:20 AM     BMP:   Lab Results   Component Value Date/Time    NA 138 12/30/2022 05:20 AM    K 4.2 12/30/2022 05:20 AM    CL 108 12/30/2022 05:20 AM    CO2 22 12/30/2022 05:20 AM    BUN 26 12/30/2022 05:20 AM    CREATININE 0.8 12/30/2022 05:20 AM    CALCIUM 8.4 12/30/2022 05:20 AM         DIAGNOSTIC STUDIES        ASSESSMENT/PLAN  1.  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  Unfortunately, thrombocytopenia has persisted and worsened despite dexamethasone and IVIG treatment.  Plan rituximab today per Dr. Victory Dakin.  I appreciate her help.  Platelet transfusion has also been ineffective and will  likely only be given if the patient were to have active bleeding.    2.  Atrial fibrillation:  Continue to hold anticoagulation.    3.  Discolored stool:  This is a nonspecific description.  I reminded nursing staff to contact me if the patient has visibly bloody stool (red stool or black tarry stool).    Continue inpatient care.        Electronically signed by Adah Salvage, MD on 12/30/2022 at 9:30 AM

## 2022-12-30 NOTE — Progress Notes (Signed)
Comprehensive Nutrition Assessment    Type and Reason for Visit:  Initial, Positive Nutrition Screen (Underweight for age BMI)    Nutrition Recommendations/Plan:   Continue Regular diet.  Ordered Ensure HP once daily (160kcal, 16g pro).     Malnutrition Assessment:  Malnutrition Status:   No malnutrition.    Findings of the 6 clinical characteristics of malnutrition:  No weight loss.  Mild muscle wasting of the temporalis/clavicles.    Nutrition Assessment:      82 y.o. 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    5/9-Pt on room air. No edema. BM 5/8.  Nutrition:  5/9-Pt reported a good appetite with no N/V/D. He reported he is eating most of the meals provided. No weight loss or difficulty chewing/swallowing. NKFA. No issues with access to food noted. No recorded po intake in the EMR.    Past Medical History:   Diagnosis Date    Atrial fibrillation (HCC)     Hyperlipidemia     Hypertension      Lab Results   Component Value Date/Time    NA 138 12/30/2022 05:20 AM    K 4.2 12/30/2022 05:20 AM    CL 108 12/30/2022 05:20 AM    CO2 22 12/30/2022 05:20 AM    BUN 26 12/30/2022 05:20 AM    CREATININE 0.8 12/30/2022 05:20 AM    GLUCOSE 122 12/30/2022 05:20 AM    CALCIUM 8.4 12/30/2022 05:20 AM     No results found for: "TRIG"  No results found for: "POCGLU"  Scheduled Meds:   diphenhydrAMINE  25 mg IntraVENous Once    pantoprazole  40 mg IntraVENous BID    atenolol  50 mg Oral BID    multivitamin  1 tablet Oral Daily    Vitamin D  1,000 Units Oral Daily    fish oil  1 capsule Oral Daily    ocuvite-lutein  1 tablet Oral Daily    rosuvastatin  10 mg Oral Nightly    sodium chloride flush  5-40 mL IntraVENous 2 times per day    dexAMETHasone  40 mg IntraVENous Daily     Continuous Infusions:   sodium chloride      sodium chloride      sodium chloride       sodium chloride      sodium chloride       PRN Meds:.sodium chloride, sodium chloride, granisetron, sodium chloride flush, sodium chloride, heparin flush, ALTEplase (CATHFLO) 2 mg in sterile water 2 mL injection, lidocaine PF, famotidine (PEPCID) injection, hydrocortisone sodium succinate PF, acetaminophen, meperidine, ondansetron, EPINEPHrine, albuterol sulfate HFA, sodium chloride flush, sodium chloride, ondansetron **OR** ondansetron, polyethylene glycol, acetaminophen **OR** acetaminophen      Nutrition Related Findings:      Wound Type:  (no pressure injuries noted.)       Current Nutrition Intake & Therapies:    Average Meal Intake: 76-100%, 51-75%     ADULT DIET; Regular    Anthropometric Measures:  Height: 175 cm (5' 8.9")  Ideal Body Weight (IBW): 159 lbs (72 kg)       Current Body Weight: 70 kg (154 lb 5.2 oz), 97.1 % IBW.    Current BMI (kg/m2): 22.9        Weight Adjustment For: No Adjustment  BMI Categories: Underweight (BMI less than 22) age over 56    Wt hx:  70kg (12/27/22)  *No wt hx in the EMR.    Estimated Daily Nutrient Needs:  Energy Requirements Based On: Formula  Weight Used for Energy Requirements: Current  Energy (kcal/day): 1799kcal (MSJx1.3)  Weight Used for Protein Requirements: Current  Protein (g/day): 84g (1.2g/kg)  CHO: 225g (50%EEN)  Method Used for Fluid Requirements: 1 ml/kcal  Fluid (ml/day): (49ml/kcal) or per MD.    Nutrition Diagnosis:   No nutrition diagnosis at this time     Nutrition Interventions:   Food and/or Nutrient Delivery: Continue Current Diet, Start Oral Nutrition Supplement             Goals:     Goals: Meet at least 75% of estimated needs, PO intake 75% or greater, by next RD assessment, other (specify)  Specify Other Goals: Prevent weight loss.    Nutrition Monitoring and Evaluation:    Wt trends, po intake, ONS tolerance, gi function, labs.          Discharge Planning:    Continue Oral Nutrition Supplement, Continue current diet      Susa Day, RD

## 2022-12-30 NOTE — Consults (Signed)
ULTRASOUND GUIDED PERIPHERAL IV PLACED    INDICATION: Multiple unsuccessful attempts to obtain peripheral IV access by nursing staff    INSERTED BY: Blakleigh Straw, RN, [facility]-BC      The patient's LEFT arm was prepped aseptically. Local anesthesia was obtained with 0.25mL of 1% lidocaine. Noted BRACHIAL vein compressible.  The BRACHIAL  vein was accessed with an 20 gauge gauge catheter 1.75 inches in length. X 1 attempts. Positive blood return and flushed easily.  Visualized catheter within vein lumen via ultrasound.  10 mL of normal saline flush used to place access.  Stat-Lock and transparent dressing applied.    COMPLICATIONS: None    TOLERATED: well with no complications

## 2022-12-30 NOTE — Plan of Care (Signed)
Problem: Pain  Goal: Verbalizes/displays adequate comfort level or baseline comfort level  Outcome: Progressing     Problem: ABCDS Injury Assessment  Goal: Absence of physical injury  Outcome: Progressing     Problem: Safety - Adult  Goal: Free from fall injury  Outcome: Progressing

## 2022-12-30 NOTE — Plan of Care (Signed)
Problem: ABCDS Injury Assessment  Goal: Absence of physical injury  12/30/2022 2150 by Ricarda Frame, RN  Outcome: Progressing  12/30/2022 1603 by Peyton Najjar, RN  Outcome: Progressing     Problem: Pain  Goal: Verbalizes/displays adequate comfort level or baseline comfort level  12/30/2022 2150 by Ricarda Frame, RN  Outcome: Progressing  12/30/2022 1603 by Peyton Najjar, RN  Outcome: Progressing     Problem: Safety - Adult  Goal: Free from fall injury  12/30/2022 2150 by Ricarda Frame, RN  Outcome: Progressing  12/30/2022 1603 by Peyton Najjar, RN  Outcome: Progressing

## 2022-12-31 DIAGNOSIS — D693 Immune thrombocytopenic purpura: Principal | ICD-10-CM

## 2022-12-31 LAB — CBC WITH AUTO DIFFERENTIAL
Basophils %: 0.1 % (ref 0.0–2.0)
Basophils Absolute: 0 10*3/uL (ref 0.0–0.2)
Eosinophils %: 0 % (ref 0.0–7.0)
Eosinophils Absolute: 0 10*3/uL (ref 0.0–0.5)
Hematocrit: 38.2 % (ref 38.0–52.0)
Hemoglobin: 13 g/dL (ref 13.0–17.3)
Immature Grans (Abs): 0.18 10*3/uL — ABNORMAL HIGH (ref 0.00–0.06)
Immature Granulocytes %: 1.1 % — ABNORMAL HIGH (ref 0.0–0.6)
Lymphocytes Absolute: 0.8 10*3/uL — ABNORMAL LOW (ref 1.0–3.2)
Lymphocytes: 5.1 % — ABNORMAL LOW (ref 15.0–45.0)
MCH: 30.5 pg (ref 27.0–34.5)
MCHC: 34 g/dL (ref 32.0–36.0)
MCV: 89.7 fL (ref 84.0–100.0)
Monocytes %: 6.6 % (ref 4.0–12.0)
Monocytes Absolute: 1.1 10*3/uL — ABNORMAL HIGH (ref 0.3–1.0)
NRBC Absolute: 0.04 10*3/uL — ABNORMAL HIGH (ref 0.000–0.012)
NRBC Automated: 0.2 % (ref 0.0–0.2)
Neutrophils %: 87.1 % — ABNORMAL HIGH (ref 42.0–74.0)
Neutrophils Absolute: 14.2 10*3/uL — ABNORMAL HIGH (ref 1.6–7.3)
Platelets: 1 10*3/uL — CL (ref 140–440)
RBC: 4.26 x10e6/mcL (ref 4.00–5.60)
RDW: 13.3 % (ref 11.0–16.0)
WBC: 16.3 10*3/uL — ABNORMAL HIGH (ref 3.8–10.6)

## 2022-12-31 LAB — BASIC METABOLIC PANEL
Anion Gap: 10 mmol/L (ref 2–17)
BUN: 25 mg/dL — ABNORMAL HIGH (ref 8–23)
CO2: 23 mmol/L (ref 22–29)
Calcium: 8.4 mg/dL — ABNORMAL LOW (ref 8.5–10.7)
Chloride: 105 mmol/L (ref 98–107)
Creatinine: 0.8 mg/dL (ref 0.7–1.3)
Est, Glom Filt Rate: 88 mL/min/1.73m (ref >=60)
Glucose: 112 mg/dL — ABNORMAL HIGH (ref 70–99)
Osmolaliy Calculated: 281 mOsm/kg (ref 270–287)
Potassium: 4.2 mmol/L (ref 3.5–5.3)
Sodium: 138 mmol/L (ref 135–145)

## 2022-12-31 LAB — IMMATURE PLATELET FRACTION
Immature Platelet, Absolute: 0 10*3/uL
Immature Platelet, Percent: 4.8 % (ref 1.2–8.6)

## 2022-12-31 MED ORDER — GLUCOSAMINE CHONDROITIN ADV PO TABS
Freq: Two times a day (BID) | ORAL | Status: DC
Start: 2022-12-31 — End: 2022-12-31

## 2022-12-31 MED ORDER — GLUCOSAMINE CHONDROITIN ADV PO TABS
Freq: Two times a day (BID) | ORAL | Status: DC
Start: 2022-12-31 — End: 2023-01-07
  Administered 2023-01-01 – 2023-01-07 (×14): 1 via ORAL

## 2022-12-31 MED ORDER — ICAPS AREDS FORMULA PO TABS
Freq: Two times a day (BID) | ORAL | Status: DC
Start: 2022-12-31 — End: 2023-01-07
  Administered 2023-01-01 – 2023-01-07 (×14): 1 via ORAL

## 2022-12-31 MED ORDER — ROMIPLOSTIM 250 MCG SC SOLR
250 | Freq: Once | SUBCUTANEOUS | Status: AC
Start: 2022-12-31 — End: 2022-12-31
  Administered 2022-12-31: 16:00:00 70 ug/kg via SUBCUTANEOUS

## 2022-12-31 MED FILL — NORMAL SALINE FLUSH 0.9 % IV SOLN: 0.9 % | INTRAVENOUS | Qty: 30

## 2022-12-31 MED FILL — NORMAL SALINE FLUSH 0.9 % IV SOLN: 0.9 % | INTRAVENOUS | Qty: 10

## 2022-12-31 MED FILL — PROTONIX 40 MG IV SOLR: 40 MG | INTRAVENOUS | Qty: 40

## 2022-12-31 MED FILL — NPLATE 250 MCG SC SOLR: 250 MCG | SUBCUTANEOUS | Qty: 0.14

## 2022-12-31 MED FILL — THERA PO TABS: ORAL | Qty: 1

## 2022-12-31 MED FILL — NORMAL SALINE FLUSH 0.9 % IV SOLN: 0.9 % | INTRAVENOUS | Qty: 20

## 2022-12-31 MED FILL — FISH OIL 1000 MG PO CAPS: 1000 MG | ORAL | Qty: 1

## 2022-12-31 MED FILL — CEROVITE SENIOR PO TABS: ORAL | Qty: 1

## 2022-12-31 MED FILL — DEXAMETHASONE SODIUM PHOSPHATE 20 MG/5ML IJ SOLN: 20 MG/5ML | INTRAMUSCULAR | Qty: 10

## 2022-12-31 MED FILL — GLUCOSAMINE CHONDROITIN ADV PO TABS: ORAL | Qty: 120

## 2022-12-31 MED FILL — ATENOLOL 25 MG PO TABS: 25 MG | ORAL | Qty: 2

## 2022-12-31 MED FILL — VITAMIN D3 25 MCG (1000 UT) PO TABS: 25 MCG (1000 UT) | ORAL | Qty: 1

## 2022-12-31 MED FILL — ROSUVASTATIN CALCIUM 10 MG PO TABS: 10 MG | ORAL | Qty: 1

## 2022-12-31 NOTE — Plan of Care (Signed)
Problem: ABCDS Injury Assessment  Goal: Absence of physical injury  Outcome: Progressing     Problem: Pain  Goal: Verbalizes/displays adequate comfort level or baseline comfort level  Outcome: Progressing     Problem: Safety - Adult  Goal: Free from fall injury  Outcome: Progressing

## 2022-12-31 NOTE — Progress Notes (Signed)
Progress Note            Date:12/31/2022        Patient Name:Luis Wilcox       Date of Birth:03/05/1941         Subjective:  Platelet count further declined to 1000 today.  Dark bowel movement yesterday but not since yesterday AM    Medications:  Prior to Admission medications    Medication Sig Start Date End Date Taking? Authorizing Provider   alendronate (FOSAMAX) 70 MG tablet Take 1 tablet by mouth every 7 days Sunday   Yes [provider]   atenolol (TENORMIN) 50 MG tablet Take 1 tablet by mouth 2 times daily   Yes [provider]   clobetasol (TEMOVATE) 0.05 % cream Apply 1 each topically as needed (skin flares)   Yes [provider]   rivaroxaban (XARELTO) 20 MG TABS tablet Take 1 tablet by mouth daily   Yes [provider]   rosuvastatin (CRESTOR) 10 MG tablet Take 1 tablet by mouth every evening   Yes [provider]   tadalafil (CIALIS) 5 MG tablet Take 1-4 tablets by mouth daily as needed for Erectile Dysfunction   Yes [provider]   calcium carbonate (TUMS) 500 MG chewable tablet Take 1-2 tablets by mouth nightly as needed for Heartburn   Yes [provider]   Multiple Vitamins-Minerals (PRESERVISION AREDS 2) CAPS Take 1 capsule by mouth in the morning and at bedtime   Yes [provider]   Multiple Vitamin (MULTIVITAMIN) TABS tablet Take 1 tablet by mouth daily   Yes [provider]   Vitamin D (CHOLECALCIFEROL) 25 MCG (1000 UT) TABS tablet Take 1 tablet by mouth daily   Yes [provider]   Omega-3 Fatty Acids (FISH OIL) 1000 MG capsule Take 1 capsule by mouth daily   Yes [provider]   fluticasone (FLONASE) 50 MCG/ACT nasal spray 1 spray by Each Nostril route nightly as needed for Rhinitis or Allergies   Yes [provider]        0.9 % sodium chloride infusion, PRN  diphenhydrAMINE (BENADRYL) injection 25 mg, Once  granisetron (KYTRIL) injection 1 mg, Once PRN  lidocaine PF 1 %  injection 0.3 mL, Once PRN  famotidine (PEPCID) 20 mg in sodium chloride (PF) 0.9 % 10 mL injection, Once PRN  hydrocortisone sodium succinate PF (SOLU-CORTEF) injection 100 mg, Once PRN  acetaminophen (TYLENOL) tablet 650 mg, Once PRN  meperidine (DEMEROL) injection 12.5 mg, PRN  ondansetron (ZOFRAN) injection 8 mg, Once PRN  EPINEPHrine 1 MG/ML injection 0.3 mg, PRN  pantoprazole (PROTONIX) injection 40 mg, BID  atenolol (TENORMIN) tablet 50 mg, BID  multivitamin 1 tablet, Daily  Vitamin D (CHOLECALCIFEROL) tablet 1,000 Units, Daily  fish oil 1000 MG capsule 1,000 mg, Daily  antioxidant multivitamin (OCUVITE) tablet, Daily  rosuvastatin (CRESTOR) tablet 10 mg, Nightly  sodium chloride flush 0.9 % injection 5-40 mL, 2 times per day  sodium chloride flush 0.9 % injection 5-40 mL, PRN  0.9 % sodium chloride infusion, PRN  ondansetron (ZOFRAN-ODT) disintegrating tablet 4 mg, Q8H PRN   Or  ondansetron (ZOFRAN) injection 4 mg, Q6H PRN  polyethylene glycol (GLYCOLAX) packet 17 g, Daily PRN  acetaminophen (TYLENOL) tablet 650 mg, Q6H PRN   Or  acetaminophen (TYLENOL) suppository 650 mg, Q6H PRN  dexAMETHasone (DECADRON) 40 mg in sodium chloride 0.9 % 50 mL IVPB, Daily        Physical Exam:  BP Marland Kitchen)  148/97 Comment: asymptomatic  Pulse 75   Temp 97.5 F (36.4 C) (Oral)   Resp 17   Ht 1.75 m (5' 8.9")   Wt 70 kg (154 lb 6.4 oz)   SpO2 96%   BMI 22.87 kg/m     GEN: Appears well, in no apparent distress.  HEENT: EOMI. No scleral icterus. Normocephalic, atraumatic.  Ecchymosis noted on tongue.  Overall mouth blistering has improved.  CV: Regular rate and rhythm. No murmurs rubs or gallops.  RESP: Breathing non-labored. Clear breath sounds bilaterally.  ABD: Soft, nontender, nondistended. No rebound or guarding. No hepatomegaly. No splenomegaly.  MSK: Normal strength and range of motion. No tenderness or swelling.  SKIN: Persistent petechiae on lower extremities.  NEURO: Alert, oriented x3. CN II - XII grossly  intact.  PSYCH: Appropriate mood, normal affect.    Laboratory Data:  Recent Labs     12/29/22  0834 12/30/22  0520 12/31/22  0616   WBC 16.9* 15.7* 16.3*   RBC 4.32 4.00 4.26   HGB 12.9* 12.0* 13.0   HCT 39.1 36.0* 38.2   MCV 90.5 90.0 89.7   RDW 13.4 13.5 13.3   PLT 2* 1* 1*       Recent Labs     12/30/22  0520 12/31/22  0616   NA 138 138   K 4.2 4.2   CL 108* 105   CO2 22 23   BUN 26* 25*   CREATININE 0.8 0.8   GLUCOSE 122* 112*       No results for input(s): "AST", "ALT", "BILIDIR", "BILITOT", "ALKPHOS" in the last 72 hours.      Recent Labs     12/29/22  0834   PROTIME 14.9*   INR 1.2*       Recent Labs     12/29/22  0834   APTT 24.4       No results found for: "FIBRINOGEN"   No results found for: "LABURIC", "URICACID"   Lab Results   Component Value Date    LDH 173 12/29/2022        No results found for: "IRON", "TIBC", "FERRITIN"   Lab Results   Component Value Date    VITAMINB12 362 12/30/2022      No results found for: "FOLATE"     No results found for: "SPEP", "UPEP"   No results found for: "KLC"   No results found for: "LLC"     Diagnostic Imaging:  US ABDOMEN COMPLETE  Narrative: Complete abdominal ultrasound: 12/29/22    INDICATION: "thrombocytopenia, evaluate for any abnormalities with liver or   spleen".    COMPARISON:    TECHNIQUE: Multiple gray-scale and color Doppler images of the abdomen.    FINDINGS:  The gallbladder is normal, no gallstones. The gallbladder wall measures 2 mm in   thickness.  CBD 2 mm cm, CHD 3.2 mm cm.  Visualized portions of the pancreas are normal. The liver is normal in size and   echotexture, measuring 14.5 cm cm.   The vena cava is patent Aorta is normal 2.1 cm proximally, 1.8 cm mid, 1.0 cm   distally.    The right kidney Is normal in echogenicity, Normal renal collecting system, no   hydronephrosis., renal diameter 9.9 cm. Small 1 cm cyst, anechoic.  The left kidney Is normal in echogenicity, Normal collecting system, no   hydronephrosis.. Renal diameter is 1.5 cm.  The  spleen is normal in appearance, measuring 9.4 in diameter.  Impression: 1. Normal  abdominal ultrasound.   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  HIV, Hep C, ANA negative. Not responding well to standard therapy with high-dose steroids/IVIG.  Got first dose weekly rituximab 375 mg/m2 on 05/09. Will look to give promacta 50mg  daily (avoiding romiplostim if possible to avoid subq injections while platelets of 1). Left message with pharmacy to discuss.  2.  History of paroxysmal atrial fibrillation: Anticoagulation held on admission.  Continue to hold with severe thrombocytopenia.

## 2022-12-31 NOTE — Plan of Care (Signed)
Problem: Pain  Goal: Verbalizes/displays adequate comfort level or baseline comfort level  12/31/2022 2234 by Ricarda Frame, RN  Outcome: Progressing  12/31/2022 1411 by Ulis Rias, RN  Outcome: Progressing     Problem: Safety - Adult  Goal: Free from fall injury  12/31/2022 2234 by Ricarda Frame, RN  Outcome: Progressing  12/31/2022 1411 by Ulis Rias, RN  Outcome: Progressing     Problem: Hematologic - Adult  Goal: Maintains hematologic stability  Outcome: Not Progressing     Problem: Hematologic - Adult  Goal: Maintains hematologic stability  Outcome: Not Progressing

## 2022-12-31 NOTE — Progress Notes (Signed)
[facility] Hospitalist Service    PROGRESS NOTE  Luis Wilcox   12/31/22     SUBJECTIVE  This is a follow-up visit for thrombocytopenia.  He denies any new bruising.  He still has bruising of his right elbow.  He still has pinpoint lesions of both forelegs, unchanged from previously.  He denies any further stool discoloration.        PHYSICAL EXAM  Vitals:    12/31/22 0515 12/31/22 0900 12/31/22 1109 12/31/22 1250   BP: (!) 148/97 (!) 166/106  123/83   Pulse:  71  79   Resp:  18  17   Temp:  97.5 F (36.4 C)  98.6 F (37 C)   TempSrc:  Oral  Oral   SpO2:  97%  97%   Weight:   70.9 kg (156 lb 6.4 oz)    Height:       The patient is awake and alert.  He is talkative.  He is a little bit tired appearing but pleasant.  Heart is regular.  Work of breathing is normal.  I hear no rales or wheezes.  Abdomen is soft and nontender.  There is no leg edema.  He has ecchymoses of the right elbow, stable.  He has petechiae on both forelegs.      LABS  CBC:   Lab Results   Component Value Date/Time    WBC 16.3 12/31/2022 06:16 AM    HGB 13.0 12/31/2022 06:16 AM    HCT 38.2 12/31/2022 06:16 AM    MCV 89.7 12/31/2022 06:16 AM    PLT 1 12/31/2022 06:16 AM     BMP:   Lab Results   Component Value Date/Time    NA 138 12/31/2022 06:16 AM    K 4.2 12/31/2022 06:16 AM    CL 105 12/31/2022 06:16 AM    CO2 23 12/31/2022 06:16 AM    BUN 25 12/31/2022 06:16 AM    CREATININE 0.8 12/31/2022 06:16 AM    CALCIUM 8.4 12/31/2022 06:16 AM         DIAGNOSTIC STUDIES        ASSESSMENT/PLAN  1.  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  Other diagnostic evaluation is negative for alternative diagnosis.  He has received dexamethasone and IVIG without clinical response.  He was treated with rituximab yesterday.  Today he received Nplate.  Repeat platelet count tomorrow.  He has not responded to platelet transfusion, and this will be deferred unless there are signs of bleeding.    2.  Atrial fibrillation, paroxysmal:  Continue rate control with atenolol.   Anticoagulation has been stopped with severe thrombocytopenia.    3.  Discolored stool:  He does not have significant anemia.  I doubt active GI bleeding.  I told nursing staff to contact me if he has red or black stool so that we can inspect.  Hemoccult testing is not indicated.  He is on PPI.    Continue inpatient care.  I appreciate the help of Dr. Victory Dakin and Dr. Shana Chute.        Electronically signed by Adah Salvage, MD on 12/31/2022 at 4:07 PM

## 2023-01-01 LAB — CBC WITH AUTO DIFFERENTIAL
Basophils %: 0.2 % (ref 0.0–2.0)
Basophils Absolute: 0 10*3/uL (ref 0.0–0.2)
Eosinophils %: 0.4 % (ref 0.0–7.0)
Eosinophils Absolute: 0.1 10*3/uL (ref 0.0–0.5)
Hematocrit: 39 % (ref 38.0–52.0)
Hemoglobin: 13.1 g/dL (ref 13.0–17.3)
Immature Grans (Abs): 0.31 10*3/uL — ABNORMAL HIGH (ref 0.00–0.06)
Immature Granulocytes %: 1.9 % — ABNORMAL HIGH (ref 0.0–0.6)
Lymphocytes Absolute: 0.9 10*3/uL — ABNORMAL LOW (ref 1.0–3.2)
Lymphocytes: 5.7 % — ABNORMAL LOW (ref 15.0–45.0)
MCH: 29.8 pg (ref 27.0–34.5)
MCHC: 33.6 g/dL (ref 32.0–36.0)
MCV: 88.8 fL (ref 84.0–100.0)
Monocytes %: 8.8 % (ref 4.0–12.0)
Monocytes Absolute: 1.4 10*3/uL — ABNORMAL HIGH (ref 0.3–1.0)
NRBC Absolute: 0.09 10*3/uL — ABNORMAL HIGH (ref 0.000–0.012)
NRBC Automated: 0.5 % — ABNORMAL HIGH (ref 0.0–0.2)
Neutrophils %: 83 % — ABNORMAL HIGH (ref 42.0–74.0)
Neutrophils Absolute: 13.6 10*3/uL — ABNORMAL HIGH (ref 1.6–7.3)
Platelets: 1 10*3/uL — CL (ref 140–440)
RBC: 4.39 x10e6/mcL (ref 4.00–5.60)
RDW: 13 % (ref 11.0–16.0)
WBC: 16.4 10*3/uL — ABNORMAL HIGH (ref 3.8–10.6)

## 2023-01-01 LAB — IMMATURE PLATELET FRACTION
Immature Platelet, Absolute: 0.1 10*3/uL
Immature Platelet, Percent: 14.2 % — ABNORMAL HIGH (ref 1.2–8.6)

## 2023-01-01 MED FILL — PROTONIX 40 MG IV SOLR: 40 MG | INTRAVENOUS | Qty: 40

## 2023-01-01 MED FILL — CEROVITE SENIOR PO TABS: ORAL | Qty: 1

## 2023-01-01 MED FILL — NORMAL SALINE FLUSH 0.9 % IV SOLN: 0.9 % | INTRAVENOUS | Qty: 20

## 2023-01-01 MED FILL — VITAMIN D3 25 MCG (1000 UT) PO TABS: 25 MCG (1000 UT) | ORAL | Qty: 1

## 2023-01-01 MED FILL — ATENOLOL 25 MG PO TABS: 25 MG | ORAL | Qty: 2

## 2023-01-01 MED FILL — NORMAL SALINE FLUSH 0.9 % IV SOLN: 0.9 % | INTRAVENOUS | Qty: 10

## 2023-01-01 MED FILL — THERA PO TABS: ORAL | Qty: 1

## 2023-01-01 MED FILL — ROSUVASTATIN CALCIUM 10 MG PO TABS: 10 MG | ORAL | Qty: 1

## 2023-01-01 NOTE — Progress Notes (Signed)
[facility] Hospitalist Service    PROGRESS NOTE  Luis Wilcox   01/01/23     SUBJECTIVE    This is a follow-up visit for thrombocytopenia.  He has no new petechiae on his legs.  Right elbow hematoma is unchanged.  He denies any discolored stools.  He denies any headache, chest pain, shortness of breath.  He reports that his blood pressure is typically well-controlled.      PHYSICAL EXAM  Vitals:    01/01/23 0810 01/01/23 0909 01/01/23 1124 01/01/23 1145   BP: (!) 160/101 (!) 156/110 (!) 142/93 (!) 141/99   Pulse: 70  82    Resp: 16  18    Temp: 98.3 F (36.8 C)  97.7 F (36.5 C)    TempSrc: Oral  Oral    SpO2: 97%  97%    Weight:       Height:          Patient is awake and alert.  He is sitting up in the chair, eating lunch.  Heart is regular.  Work of breathing is normal.  I hear no rales or wheezes.  Abdomen is soft and nontender.  There is no leg edema.  He has petechiae on both forelegs, unchanged from yesterday.  He has right elbow ecchymoses.    LABS  CBC:   Lab Results   Component Value Date/Time    WBC 16.4 01/01/2023 06:00 AM    HGB 13.1 01/01/2023 06:00 AM    HCT 39.0 01/01/2023 06:00 AM    MCV 88.8 01/01/2023 06:00 AM    PLT 1 01/01/2023 06:00 AM     BMP:   Lab Results   Component Value Date/Time    NA 138 12/31/2022 06:16 AM    K 4.2 12/31/2022 06:16 AM    CL 105 12/31/2022 06:16 AM    CO2 23 12/31/2022 06:16 AM    BUN 25 12/31/2022 06:16 AM    CREATININE 0.8 12/31/2022 06:16 AM    CALCIUM 8.4 12/31/2022 06:16 AM         DIAGNOSTIC STUDIES        ASSESSMENT/PLAN  1.  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  He has been treated with systemic corticosteroids, IVIG, Rituxan, and Nplate.  I appreciate the help of Dr. Shana Chute.  He has no new bleeding.  Follow for recovery and counts.  He has not benefited from platelet transfusion, and this will be avoided unless there is bleeding.    2.  Atrial fibrillation:  He is off anticoagulation.  Continue atenolol.    3.  Elevated blood pressure:  Acute blood  pressure elevation may be a physiologic response to his acute illness.  Recent studies do not support the use of "as needed" antihypertensives for asymptomatic blood pressure elevation in the acute care setting, absent any acute cardiovascular problem.  Continue usual atenolol.    4.  Discolored stool:  Stool discoloration seems to be transient.  I am doubtful of any acute bleeding.  Nursing staff will continue to monitor for obvious melena or hematochezia.    Continue inpatient care.    Electronically signed by Adah Salvage, MD on 01/01/2023 at 2:08 PM

## 2023-01-01 NOTE — Plan of Care (Signed)
Problem: Hematologic - Adult  Goal: Maintains hematologic stability  01/01/2023 1214 by Blair Hailey, RN  Outcome: Not Progressing  12/31/2022 2234 by Ricarda Frame, RN  Outcome: Not Progressing

## 2023-01-01 NOTE — Progress Notes (Signed)
Progress Note            Date:01/01/2023        Patient Name:Luis Wilcox       Date of Birth:October 28, 1940         Subjective:  Got Nplate yesterday    Medications:  Prior to Admission medications    Medication Sig Start Date End Date Taking? Authorizing Provider   alendronate (FOSAMAX) 70 MG tablet Take 1 tablet by mouth every 7 days Sunday   Yes [provider]   atenolol (TENORMIN) 50 MG tablet Take 1 tablet by mouth 2 times daily   Yes [provider]   clobetasol (TEMOVATE) 0.05 % cream Apply 1 each topically as needed (skin flares)   Yes [provider]   rivaroxaban (XARELTO) 20 MG TABS tablet Take 1 tablet by mouth daily   Yes [provider]   rosuvastatin (CRESTOR) 10 MG tablet Take 1 tablet by mouth every evening   Yes [provider]   tadalafil (CIALIS) 5 MG tablet Take 1-4 tablets by mouth daily as needed for Erectile Dysfunction   Yes [provider]   calcium carbonate (TUMS) 500 MG chewable tablet Take 1-2 tablets by mouth nightly as needed for Heartburn   Yes [provider]   Multiple Vitamins-Minerals (PRESERVISION AREDS 2) CAPS Take 1 capsule by mouth in the morning and at bedtime   Yes [provider]   Multiple Vitamin (MULTIVITAMIN) TABS tablet Take 1 tablet by mouth daily   Yes [provider]   Vitamin D (CHOLECALCIFEROL) 25 MCG (1000 UT) TABS tablet Take 1 tablet by mouth daily   Yes [provider]   Omega-3 Fatty Acids (FISH OIL) 1000 MG capsule Take 1 capsule by mouth daily   Yes [provider]   fluticasone (FLONASE) 50 MCG/ACT nasal spray 1 spray by Each Nostril route nightly as needed for Rhinitis or Allergies   Yes [provider]        Glucosamine Chondroitin 1 tablet pt supplied, BID  PreserVision capsule pt supplied, BID  diphenhydrAMINE (BENADRYL) injection 25 mg, Once  meperidine (DEMEROL) injection 12.5 mg, PRN  EPINEPHrine 1 MG/ML injection 0.3 mg, PRN  pantoprazole  (PROTONIX) injection 40 mg, BID  atenolol (TENORMIN) tablet 50 mg, BID  multivitamin 1 tablet, Daily  Vitamin D (CHOLECALCIFEROL) tablet 1,000 Units, Daily  antioxidant multivitamin (OCUVITE) tablet, Daily  rosuvastatin (CRESTOR) tablet 10 mg, Nightly  sodium chloride flush 0.9 % injection 5-40 mL, 2 times per day  sodium chloride flush 0.9 % injection 5-40 mL, PRN  0.9 % sodium chloride infusion, PRN  ondansetron (ZOFRAN-ODT) disintegrating tablet 4 mg, Q8H PRN   Or  ondansetron (ZOFRAN) injection 4 mg, Q6H PRN  polyethylene glycol (GLYCOLAX) packet 17 g, Daily PRN  acetaminophen (TYLENOL) tablet 650 mg, Q6H PRN   Or  acetaminophen (TYLENOL) suppository 650 mg, Q6H PRN        Physical Exam:  BP (!) 156/110 Comment: pt sitting in chair  Pulse 70   Temp 98.3 F (36.8 C) (Oral)   Resp 16   Ht 1.75 m (5' 8.9")   Wt 70.9 kg (156 lb 6.4 oz)   SpO2 97%   BMI 23.16 kg/m     GEN: Appears well, in no apparent distress.  HEENT: EOMI. No scleral icterus. Normocephalic, atraumatic.  Ecchymosis noted on tongue.  Overall mouth blistering has improved.  CV: Regular rate and rhythm. No murmurs rubs or gallops.  RESP: Breathing non-labored.  Clear breath sounds bilaterally.  ABD: Soft, nontender, nondistended. No rebound or guarding. No hepatomegaly. No splenomegaly.  MSK: Normal strength and range of motion. No tenderness or swelling.  SKIN: Persistent petechiae on lower extremities.  NEURO: Alert, oriented x3. CN II - XII grossly intact.  PSYCH: Appropriate mood, normal affect.    Laboratory Data:  Recent Labs     12/30/22  0520 12/31/22  0616 01/01/23  0600   WBC 15.7* 16.3* 16.4*   RBC 4.00 4.26 4.39   HGB 12.0* 13.0 13.1   HCT 36.0* 38.2 39.0   MCV 90.0 89.7 88.8   RDW 13.5 13.3 13.0   PLT 1* 1* 1*       Recent Labs     12/30/22  0520 12/31/22  0616   NA 138 138   K 4.2 4.2   CL 108* 105   CO2 22 23   BUN 26* 25*   CREATININE 0.8 0.8   GLUCOSE 122* 112*       No results for input(s): "AST", "ALT", "BILIDIR", "BILITOT",  "ALKPHOS" in the last 72 hours.      No results for input(s): "PROTIME", "INR" in the last 72 hours.    No results for input(s): "APTT" in the last 72 hours.    No results found for: "FIBRINOGEN"   No results found for: "LABURIC", "URICACID"   Lab Results   Component Value Date    LDH 173 12/29/2022        No results found for: "IRON", "TIBC", "FERRITIN"   Lab Results   Component Value Date    VITAMINB12 362 12/30/2022      No results found for: "FOLATE"     No results found for: "SPEP", "UPEP"   No results found for: "KLC"   No results found for: "LLC"     Diagnostic Imaging:  US ABDOMEN COMPLETE  Narrative: Complete abdominal ultrasound: 12/29/22    INDICATION: "thrombocytopenia, evaluate for any abnormalities with liver or   spleen".    COMPARISON:    TECHNIQUE: Multiple gray-scale and color Doppler images of the abdomen.    FINDINGS:  The gallbladder is normal, no gallstones. The gallbladder wall measures 2 mm in   thickness.  CBD 2 mm cm, CHD 3.2 mm cm.  Visualized portions of the pancreas are normal. The liver is normal in size and   echotexture, measuring 14.5 cm cm.   The vena cava is patent Aorta is normal 2.1 cm proximally, 1.8 cm mid, 1.0 cm   distally.    The right kidney Is normal in echogenicity, Normal renal collecting system, no   hydronephrosis., renal diameter 9.9 cm. Small 1 cm cyst, anechoic.  The left kidney Is normal in echogenicity, Normal collecting system, no   hydronephrosis.. Renal diameter is 1.5 cm.  The spleen is normal in appearance, measuring 9.4 in diameter.  Impression: 1. Normal abdominal ultrasound.   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  HIV, Hep C, ANA negative.   - Got first dose weekly rituximab 375 mg/m2 on  05/09.   - Gave a dose of 49mcg/kg Nplate on 05/10.   - Five doses dex  40mg  05/06-05/10.   - IVIG 30g 05/06-05/08.  Discussed case with patient and his wife again today. Reviewed case again but no other etiologies seem likely with normal RBC morphology and count, normal renal function, normal LDH, and no recent blood transfusions in the weeks preceding admission.  2.  History of paroxysmal atrial fibrillation: Anticoagulation held on admission.  Continue to hold with severe thrombocytopenia.    >35 minute spent in this visit in reviewing case history in depth, discussing with patient and wife, and coordination of care

## 2023-01-01 NOTE — Plan of Care (Signed)
Problem: ABCDS Injury Assessment  Goal: Absence of physical injury  01/01/2023 2152 by Ricarda Frame, RN  Outcome: Progressing  01/01/2023 1214 by Blair Hailey, RN  Outcome: Progressing     Problem: Pain  Goal: Verbalizes/displays adequate comfort level or baseline comfort level  01/01/2023 2152 by Ricarda Frame, RN  Outcome: Adequate for Discharge  01/01/2023 1214 by Blair Hailey, RN  Outcome: Progressing     Problem: Safety - Adult  Goal: Free from fall injury  01/01/2023 2152 by Ricarda Frame, RN  Outcome: Progressing  01/01/2023 1214 by Blair Hailey, RN  Outcome: Progressing     Problem: Hematologic - Adult  Goal: Maintains hematologic stability  01/01/2023 2152 by Ricarda Frame, RN  Outcome: Not Progressing  01/01/2023 1214 by Blair Hailey, RN  Outcome: Not Progressing     Problem: Hematologic - Adult  Goal: Maintains hematologic stability  01/01/2023 2152 by Ricarda Frame, RN  Outcome: Not Progressing  01/01/2023 1214 by Blair Hailey, RN  Outcome: Not Progressing     Problem: Hematologic - Adult  Goal: Maintains hematologic stability  01/01/2023 2152 by Ricarda Frame, RN  Outcome: Not Progressing  01/01/2023 1214 by Blair Hailey, RN  Outcome: Not Progressing

## 2023-01-02 LAB — CBC WITH AUTO DIFFERENTIAL
Basophils %: 0.2 % (ref 0.0–2.0)
Basophils Absolute: 0 10*3/uL (ref 0.0–0.2)
Eosinophils %: 1.2 % (ref 0.0–7.0)
Eosinophils Absolute: 0.1 10*3/uL (ref 0.0–0.5)
Hematocrit: 39.2 % (ref 38.0–52.0)
Hemoglobin: 13.2 g/dL (ref 13.0–17.3)
Immature Grans (Abs): 0.41 10*3/uL — ABNORMAL HIGH (ref 0.00–0.06)
Immature Granulocytes %: 3.5 % — ABNORMAL HIGH (ref 0.0–0.6)
Lymphocytes Absolute: 1.8 10*3/uL (ref 1.0–3.2)
Lymphocytes: 15 % (ref 15.0–45.0)
MCH: 29.9 pg (ref 27.0–34.5)
MCHC: 33.7 g/dL (ref 32.0–36.0)
MCV: 88.9 fL (ref 84.0–100.0)
Monocytes %: 10.3 % (ref 4.0–12.0)
Monocytes Absolute: 1.2 10*3/uL — ABNORMAL HIGH (ref 0.3–1.0)
NRBC Absolute: 0.1 10*3/uL — ABNORMAL HIGH (ref 0.000–0.012)
NRBC Automated: 0.9 % — ABNORMAL HIGH (ref 0.0–0.2)
Neutrophils %: 69.8 % (ref 42.0–74.0)
Neutrophils Absolute: 8.2 10*3/uL — ABNORMAL HIGH (ref 1.6–7.3)
Platelets: 1 10*3/uL — CL (ref 140–440)
RBC: 4.41 x10e6/mcL (ref 4.00–5.60)
RDW: 13.2 % (ref 11.0–16.0)
WBC: 11.7 10*3/uL — ABNORMAL HIGH (ref 3.8–10.6)

## 2023-01-02 LAB — BASIC METABOLIC PANEL
Anion Gap: 8 mmol/L (ref 2–17)
BUN: 33 mg/dL — ABNORMAL HIGH (ref 8–23)
CO2: 25 mmol/L (ref 22–29)
Calcium: 8 mg/dL — ABNORMAL LOW (ref 8.5–10.7)
Chloride: 104 mmol/L (ref 98–107)
Creatinine: 0.7 mg/dL (ref 0.7–1.3)
Est, Glom Filt Rate: 91 mL/min/1.73m (ref >=60)
Glucose: 94 mg/dL (ref 70–99)
Osmolaliy Calculated: 281 mOsm/kg (ref 270–287)
Potassium: 4.1 mmol/L (ref 3.5–5.3)
Sodium: 137 mmol/L (ref 135–145)

## 2023-01-02 LAB — IMMATURE PLATELET FRACTION
Immature Platelet, Absolute: 0.1 10*3/uL
Immature Platelet, Percent: 8.4 % (ref 1.2–8.6)

## 2023-01-02 LAB — *Unknown: Occult Blood Fecal: POSITIVE — AB

## 2023-01-02 MED FILL — PROTONIX 40 MG IV SOLR: 40 MG | INTRAVENOUS | Qty: 40

## 2023-01-02 MED FILL — THERA PO TABS: ORAL | Qty: 1

## 2023-01-02 MED FILL — ROSUVASTATIN CALCIUM 10 MG PO TABS: 10 MG | ORAL | Qty: 1

## 2023-01-02 MED FILL — ATENOLOL 25 MG PO TABS: 25 MG | ORAL | Qty: 2

## 2023-01-02 MED FILL — NORMAL SALINE FLUSH 0.9 % IV SOLN: 0.9 % | INTRAVENOUS | Qty: 20

## 2023-01-02 MED FILL — VITAMIN D3 25 MCG (1000 UT) PO TABS: 25 MCG (1000 UT) | ORAL | Qty: 1

## 2023-01-02 NOTE — Plan of Care (Signed)
Problem: Hematologic - Adult  Goal: Maintains hematologic stability  01/02/2023 1127 by Blair Hailey, RN  Outcome: Not Progressing  01/01/2023 2152 by Ricarda Frame, RN  Outcome: Not Progressing

## 2023-01-02 NOTE — Progress Notes (Signed)
[facility] Hospitalist Service    PROGRESS NOTE  Luis Wilcox   01/02/23     SUBJECTIVE  This is a follow-up visit for severe thrombocytopenia.  He feels about the same as yesterday.  He has not noticed any bruising.  He denies fever.  Nursing staff reports that stool seemed a little bit darker than normal.  He has not had any red stool.  His wife plans to bring him in his usual Fosamax which she takes weekly on Monday.        PHYSICAL EXAM  Vitals:    01/01/23 2329 01/02/23 0445 01/02/23 0821 01/02/23 1240   BP: 120/81 133/82 138/81 117/81   Pulse: 57 78 70 79   Resp: 17 17 16 16    Temp: 97.6 F (36.4 C) 98 F (36.7 C) 97.9 F (36.6 C) 97.8 F (36.6 C)   TempSrc: Oral Oral Oral Oral   SpO2: 97% 97% 99% 99%   Weight:       Height:          The patient is sleeping on my arrival.  He awakens to voice.  He is pleasant and comfortable appearing.  Heart is regular.  Work of breathing seems normal.  I hear no rales or wheezes.  Abdomen is soft and nontender.  There is no leg edema.  Ecchymosis of the right elbow are unchanged.  He has petechia on both forelegs, unchanged.    LABS  CBC:   Lab Results   Component Value Date/Time    WBC 11.7 01/02/2023 06:08 AM    HGB 13.2 01/02/2023 06:08 AM    HCT 39.2 01/02/2023 06:08 AM    MCV 88.9 01/02/2023 06:08 AM    PLT 1 01/02/2023 06:08 AM     BMP:   Lab Results   Component Value Date/Time    NA 137 01/02/2023 06:08 AM    K 4.1 01/02/2023 06:08 AM    CL 104 01/02/2023 06:08 AM    CO2 25 01/02/2023 06:08 AM    BUN 33 01/02/2023 06:08 AM    CREATININE 0.7 01/02/2023 06:08 AM    CALCIUM 8.0 01/02/2023 06:08 AM         DIAGNOSTIC STUDIES        ASSESSMENT/PLAN    1.  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  He has been treated with a variety of medications.  Thrombocytopenia persists.  We will see if platelet count slowly improves following recent rituximab infusion.  He will have weekly treatment.  Monitor for signs of bleeding.  Platelet count has not improved with platelet  transfusion.  Transfusion will only be given if the patient has bleeding.    2.  Atrial fibrillation:  He is in sinus rhythm.  He is on atenolol.  Avoid anticoagulation with thrombocytopenia.    3.  Benign essential hypertension:  Blood pressure is somewhat variable, typical for the acute care setting.  No adjustment is needed in antihypertensive medication.    4.  Discolored stool:  He has occasionally had dark stool.  Hemoccult testing may be positive but is not clinically useful.  Nursing staff will continue to monitor for any frank melena or hematochezia.  He is receiving prophylactic IV PPI.    Continue inpatient care.      Electronically signed by Adah Salvage, MD on 01/02/2023 at 2:05 PM

## 2023-01-02 NOTE — Progress Notes (Signed)
Progress Note            Date:01/02/2023        Patient Name:Luis Wilcox       Date of Birth:11/12/1940         Subjective:  Asking about his fosamax    Medications:  Prior to Admission medications    Medication Sig Start Date End Date Taking? Authorizing Provider   alendronate (FOSAMAX) 70 MG tablet Take 1 tablet by mouth every 7 days Sunday   Yes [provider]   atenolol (TENORMIN) 50 MG tablet Take 1 tablet by mouth 2 times daily   Yes [provider]   clobetasol (TEMOVATE) 0.05 % cream Apply 1 each topically as needed (skin flares)   Yes [provider]   rivaroxaban (XARELTO) 20 MG TABS tablet Take 1 tablet by mouth daily   Yes [provider]   rosuvastatin (CRESTOR) 10 MG tablet Take 1 tablet by mouth every evening   Yes [provider]   tadalafil (CIALIS) 5 MG tablet Take 1-4 tablets by mouth daily as needed for Erectile Dysfunction   Yes [provider]   calcium carbonate (TUMS) 500 MG chewable tablet Take 1-2 tablets by mouth nightly as needed for Heartburn   Yes [provider]   Multiple Vitamins-Minerals (PRESERVISION AREDS 2) CAPS Take 1 capsule by mouth in the morning and at bedtime   Yes [provider]   Multiple Vitamin (MULTIVITAMIN) TABS tablet Take 1 tablet by mouth daily   Yes [provider]   Vitamin D (CHOLECALCIFEROL) 25 MCG (1000 UT) TABS tablet Take 1 tablet by mouth daily   Yes [provider]   Omega-3 Fatty Acids (FISH OIL) 1000 MG capsule Take 1 capsule by mouth daily   Yes [provider]   fluticasone (FLONASE) 50 MCG/ACT nasal spray 1 spray by Each Nostril route nightly as needed for Rhinitis or Allergies   Yes [provider]        Glucosamine Chondroitin 1 tablet pt supplied, BID  PreserVision capsule pt supplied, BID  diphenhydrAMINE (BENADRYL) injection 25 mg, Once  meperidine (DEMEROL) injection 12.5 mg, PRN  EPINEPHrine 1 MG/ML injection 0.3 mg,  PRN  pantoprazole (PROTONIX) injection 40 mg, BID  atenolol (TENORMIN) tablet 50 mg, BID  multivitamin 1 tablet, Daily  Vitamin D (CHOLECALCIFEROL) tablet 1,000 Units, Daily  antioxidant multivitamin (OCUVITE) tablet, Daily  rosuvastatin (CRESTOR) tablet 10 mg, Nightly  sodium chloride flush 0.9 % injection 5-40 mL, 2 times per day  sodium chloride flush 0.9 % injection 5-40 mL, PRN  0.9 % sodium chloride infusion, PRN  ondansetron (ZOFRAN-ODT) disintegrating tablet 4 mg, Q8H PRN   Or  ondansetron (ZOFRAN) injection 4 mg, Q6H PRN  polyethylene glycol (GLYCOLAX) packet 17 g, Daily PRN  acetaminophen (TYLENOL) tablet 650 mg, Q6H PRN   Or  acetaminophen (TYLENOL) suppository 650 mg, Q6H PRN        Physical Exam:  BP 138/81   Pulse 70   Temp 97.9 F (36.6 C) (Oral)   Resp 16   Ht 1.75 m (5' 8.9")   Wt 70.9 kg (156 lb 6.4 oz)   SpO2 99%   BMI 23.16 kg/m     GEN: Appears well, in no apparent distress.  HEENT: EOMI. No scleral icterus. Normocephalic, atraumatic.  Ecchymosis noted on tongue.  Overall mouth blistering has improved.  CV: Regular rate and rhythm. No murmurs rubs or gallops.  RESP: Breathing non-labored. Clear breath sounds bilaterally.  ABD: Soft, nontender, nondistended. No rebound or guarding. No hepatomegaly. No splenomegaly.  MSK: Normal strength and range of motion. No tenderness or swelling.  SKIN: Persistent petechiae on lower extremities.  NEURO: Alert, oriented x3. CN II - XII grossly intact.  PSYCH: Appropriate mood, normal affect.    Laboratory Data:  Recent Labs     12/31/22  0616 01/01/23  0600 01/02/23  0608   WBC 16.3* 16.4* 11.7*   RBC 4.26 4.39 4.41   HGB 13.0 13.1 13.2   HCT 38.2 39.0 39.2   MCV 89.7 88.8 88.9   RDW 13.3 13.0 13.2   PLT 1* 1* 1*       Recent Labs     12/31/22  0616 01/02/23  0608   NA 138 137   K 4.2 4.1   CL 105 104   CO2 23 25   BUN 25* 33*   CREATININE 0.8 0.7   GLUCOSE 112* 94       No results for input(s): "AST", "ALT", "BILIDIR", "BILITOT", "ALKPHOS" in the  last 72 hours.      No results for input(s): "PROTIME", "INR" in the last 72 hours.    No results for input(s): "APTT" in the last 72 hours.    No results found for: "FIBRINOGEN"   No results found for: "LABURIC", "URICACID"   Lab Results   Component Value Date    LDH 173 12/29/2022        No results found for: "IRON", "TIBC", "FERRITIN"   Lab Results   Component Value Date    VITAMINB12 362 12/30/2022      No results found for: "FOLATE"     No results found for: "SPEP", "UPEP"   No results found for: "KLC"   No results found for: "LLC"     Diagnostic Imaging:  US ABDOMEN COMPLETE  Narrative: Complete abdominal ultrasound: 12/29/22    INDICATION: "thrombocytopenia, evaluate for any abnormalities with liver or   spleen".    COMPARISON:    TECHNIQUE: Multiple gray-scale and color Doppler images of the abdomen.    FINDINGS:  The gallbladder is normal, no gallstones. The gallbladder wall measures 2 mm in   thickness.  CBD 2 mm cm, CHD 3.2 mm cm.  Visualized portions of the pancreas are normal. The liver is normal in size and   echotexture, measuring 14.5 cm cm.   The vena cava is patent Aorta is normal 2.1 cm proximally, 1.8 cm mid, 1.0 cm   distally.    The right kidney Is normal in echogenicity, Normal renal collecting system, no   hydronephrosis., renal diameter 9.9 cm. Small 1 cm cyst, anechoic.  The left kidney Is normal in echogenicity, Normal collecting system, no   hydronephrosis.. Renal diameter is 1.5 cm.  The spleen is normal in appearance, measuring 9.4 in diameter.  Impression: 1. Normal abdominal ultrasound.   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  HIV, Hep C, ANA negative.   - Got first dose weekly rituximab 375 mg/m2 on 05/09.   - Gave a  dose of 4mcg/kg Nplate on 05/10.   - Five doses dex 40mg  05/06-05/10.   -  IVIG 30g 05/06-05/08.  Discussed case with patient and his wife again today. Reviewed case again but no other etiologies seem likely with normal RBC morphology and count, normal renal function, normal LDH, and no recent blood transfusions in the weeks preceding admission. Continue to await platelet recovery. May look to do BMBx this week if continued slow response.    2.  History of paroxysmal atrial fibrillation: Anticoagulation held on admission.  Continue to hold with severe thrombocytopenia.    3.  Osteopenia: Okay to continue home fosamax (his wife is bringing it in).

## 2023-01-03 LAB — CBC WITH AUTO DIFFERENTIAL
Basophils %: 0.3 % (ref 0.0–2.0)
Basophils Absolute: 0 10*3/uL (ref 0.0–0.2)
Eosinophils %: 2.6 % (ref 0.0–7.0)
Eosinophils Absolute: 0.2 10*3/uL (ref 0.0–0.5)
Hematocrit: 37.9 % — ABNORMAL LOW (ref 38.0–52.0)
Hemoglobin: 13.3 g/dL (ref 13.0–17.3)
Immature Grans (Abs): 0.29 10*3/uL — ABNORMAL HIGH (ref 0.00–0.06)
Immature Granulocytes %: 3.7 % — ABNORMAL HIGH (ref 0.0–0.6)
Lymphocytes Absolute: 1.3 10*3/uL (ref 1.0–3.2)
Lymphocytes: 16.8 % (ref 15.0–45.0)
MCH: 30.6 pg (ref 27.0–34.5)
MCHC: 35.1 g/dL (ref 32.0–36.0)
MCV: 87.1 fL (ref 84.0–100.0)
Monocytes %: 10.2 % (ref 4.0–12.0)
Monocytes Absolute: 0.8 10*3/uL (ref 0.3–1.0)
NRBC Absolute: 0.03 10*3/uL — ABNORMAL HIGH (ref 0.000–0.012)
NRBC Automated: 0.4 % — ABNORMAL HIGH (ref 0.0–0.2)
Neutrophils %: 66.4 % (ref 42.0–74.0)
Neutrophils Absolute: 5.3 10*3/uL (ref 1.6–7.3)
Platelets: 1 10*3/uL — CL (ref 140–440)
RBC: 4.35 x10e6/mcL (ref 4.00–5.60)
RDW: 13.2 % (ref 11.0–16.0)
WBC: 7.9 10*3/uL (ref 3.8–10.6)

## 2023-01-03 LAB — IMMATURE PLATELET FRACTION
Immature Platelet, Absolute: 0 10*3/uL
Immature Platelet, Percent: 4.2 % (ref 1.2–8.6)

## 2023-01-03 MED ORDER — ALENDRONATE SODIUM 70 MG PO TABS
70 MG | ORAL | Status: DC
Start: 2023-01-03 — End: 2023-01-07
  Administered 2023-01-03: 12:00:00 70 mg via ORAL

## 2023-01-03 MED ORDER — ROMIPLOSTIM 500 MCG SC SOLR
500 MCG | Freq: Once | SUBCUTANEOUS | Status: AC
Start: 2023-01-03 — End: 2023-01-05
  Administered 2023-01-05: 17:00:00 355 ug/kg via SUBCUTANEOUS

## 2023-01-03 MED FILL — PROTONIX 40 MG IV SOLR: 40 MG | INTRAVENOUS | Qty: 40

## 2023-01-03 MED FILL — THERA PO TABS: ORAL | Qty: 1

## 2023-01-03 MED FILL — ATENOLOL 25 MG PO TABS: 25 MG | ORAL | Qty: 2

## 2023-01-03 MED FILL — ROSUVASTATIN CALCIUM 10 MG PO TABS: 10 MG | ORAL | Qty: 1

## 2023-01-03 MED FILL — VITAMIN D3 25 MCG (1000 UT) PO TABS: 25 MCG (1000 UT) | ORAL | Qty: 1

## 2023-01-03 MED FILL — NORMAL SALINE FLUSH 0.9 % IV SOLN: 0.9 % | INTRAVENOUS | Qty: 10

## 2023-01-03 MED FILL — ALENDRONATE SODIUM 70 MG PO TABS: 70 MG | ORAL | Qty: 1

## 2023-01-03 MED FILL — NORMAL SALINE FLUSH 0.9 % IV SOLN: 0.9 % | INTRAVENOUS | Qty: 20

## 2023-01-03 NOTE — Progress Notes (Signed)
[facility] Hospitalist Service    PROGRESS NOTE  Luis Wilcox   01/03/23     SUBJECTIVE  This is a follow-up visit for thrombocytopenia.  He has no new problems today.  He denies any new bruising.  He has occasionally seen some dark discoloration of stool, but no red stool.  He denies feeling dizzy.        PHYSICAL EXAM  Vitals:    01/03/23 0920 01/03/23 1128 01/03/23 1622 01/03/23 1935   BP: (!) 126/97 124/79 120/84 128/84   Pulse:  75 73 87   Resp:  16 18 18    Temp:  98 F (36.7 C) 97.6 F (36.4 C) 97.6 F (36.4 C)   TempSrc:  Oral Oral Oral   SpO2:  98% 97% 98%   Weight:       Height:       Patient is awake and alert.  He is pleasant and talkative.  He is walking freely in his room, nontoxic-appearing.  Heart is regular.  Work of breathing is normal.  Lungs seem clear.  Abdomen is soft and nontender.  He has some ecchymoses on his right elbow, stable.  He has petechiae on both forelegs, unchanged from prior examination.      LABS  CBC:   Lab Results   Component Value Date/Time    WBC 7.9 01/03/2023 06:12 AM    HGB 13.3 01/03/2023 06:12 AM    HCT 37.9 01/03/2023 06:12 AM    MCV 87.1 01/03/2023 06:12 AM    PLT 1 01/03/2023 06:12 AM     BMP:   Lab Results   Component Value Date/Time    NA 137 01/02/2023 06:08 AM    K 4.1 01/02/2023 06:08 AM    CL 104 01/02/2023 06:08 AM    CO2 25 01/02/2023 06:08 AM    BUN 33 01/02/2023 06:08 AM    CREATININE 0.7 01/02/2023 06:08 AM    CALCIUM 8.0 01/02/2023 06:08 AM         DIAGNOSTIC STUDIES        ASSESSMENT/PLAN  1.  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  No other causes identified.  He was treated with systemic corticosteroids with IVIG without improvement.  Rituximab was administered last week.  He received Nplate last week and another dose is planned for today.  Input from Dr. Victory Dakin is appreciated.  Bone marrow biopsy is being considered.    2.  Atrial fibrillation:  This is paroxysmal.  Anticoagulation was stopped because of severe thrombocytopenia.  Continue usual  atenolol.    3.  Benign essential hypertension:  He had some transient elevation in his blood pressure above normal range.  No additional treatment is needed.  Continue usual atenolol.    4.  Discolored stool:  He has had some dark stool, but without obvious melena or hematochezia.  We doubt acute bleeding.  We have given him empiric PPI.  He has not developed anemia.  Nursing staff and patient will continue to monitor for any sign of GI bleeding.    Continue inpatient care.  Dr. Carla Drape will follow-up for me starting tomorrow.        Electronically signed by Adah Salvage, MD on 01/03/2023 at 7:58 PM

## 2023-01-03 NOTE — Progress Notes (Signed)
Progress Note            Date:01/03/2023        Patient Name:Luis Wilcox       Date of Birth:1941/05/10         Subjective:  No bleeding overnight. Mild bruising but overall petechiae on LE improving from admission.    Medications:  Prior to Admission medications    Medication Sig Start Date End Date Taking? Authorizing Provider   alendronate (FOSAMAX) 70 MG tablet Take 1 tablet by mouth every 7 days Sunday   Yes [provider]   atenolol (TENORMIN) 50 MG tablet Take 1 tablet by mouth 2 times daily   Yes [provider]   clobetasol (TEMOVATE) 0.05 % cream Apply 1 each topically as needed (skin flares)   Yes [provider]   rivaroxaban (XARELTO) 20 MG TABS tablet Take 1 tablet by mouth daily   Yes [provider]   rosuvastatin (CRESTOR) 10 MG tablet Take 1 tablet by mouth every evening   Yes [provider]   tadalafil (CIALIS) 5 MG tablet Take 1-4 tablets by mouth daily as needed for Erectile Dysfunction   Yes [provider]   calcium carbonate (TUMS) 500 MG chewable tablet Take 1-2 tablets by mouth nightly as needed for Heartburn   Yes [provider]   Multiple Vitamins-Minerals (PRESERVISION AREDS 2) CAPS Take 1 capsule by mouth in the morning and at bedtime   Yes [provider]   Multiple Vitamin (MULTIVITAMIN) TABS tablet Take 1 tablet by mouth daily   Yes [provider]   Vitamin D (CHOLECALCIFEROL) 25 MCG (1000 UT) TABS tablet Take 1 tablet by mouth daily   Yes [provider]   Omega-3 Fatty Acids (FISH OIL) 1000 MG capsule Take 1 capsule by mouth daily   Yes [provider]   fluticasone (FLONASE) 50 MCG/ACT nasal spray 1 spray by Each Nostril route nightly as needed for Rhinitis or Allergies   Yes [provider]        Glucosamine Chondroitin 1 tablet pt supplied, BID  PreserVision capsule pt supplied, BID  diphenhydrAMINE (BENADRYL) injection 25 mg, Once  meperidine (DEMEROL) injection  12.5 mg, PRN  EPINEPHrine 1 MG/ML injection 0.3 mg, PRN  pantoprazole (PROTONIX) injection 40 mg, BID  atenolol (TENORMIN) tablet 50 mg, BID  multivitamin 1 tablet, Daily  Vitamin D (CHOLECALCIFEROL) tablet 1,000 Units, Daily  antioxidant multivitamin (OCUVITE) tablet, Daily  rosuvastatin (CRESTOR) tablet 10 mg, Nightly  sodium chloride flush 0.9 % injection 5-40 mL, 2 times per day  sodium chloride flush 0.9 % injection 5-40 mL, PRN  0.9 % sodium chloride infusion, PRN  ondansetron (ZOFRAN-ODT) disintegrating tablet 4 mg, Q8H PRN   Or  ondansetron (ZOFRAN) injection 4 mg, Q6H PRN  polyethylene glycol (GLYCOLAX) packet 17 g, Daily PRN  acetaminophen (TYLENOL) tablet 650 mg, Q6H PRN   Or  acetaminophen (TYLENOL) suppository 650 mg, Q6H PRN        Physical Exam:  BP 124/79   Pulse 75   Temp 98 F (36.7 C) (Oral)   Resp 18   Ht 1.75 m (5' 8.9")   Wt 70.9 kg (156 lb 6.4 oz)   SpO2 98%   BMI 23.16 kg/m     GEN: Appears well, in no apparent distress.  HEENT: EOMI. No scleral icterus. Normocephalic, atraumatic.  Ecchymosis noted on tongue.  Overall mouth blistering has improved.  CV: Regular rate and rhythm. No murmurs rubs or  gallops.  RESP: Breathing non-labored. Clear breath sounds bilaterally.  ABD: Soft, nontender, nondistended. No rebound or guarding. No hepatomegaly. No splenomegaly.  MSK: Normal strength and range of motion. No tenderness or swelling.  SKIN: Persistent petechiae on lower extremities, improving.  NEURO: Alert, oriented x3. CN II - XII grossly intact.  PSYCH: Appropriate mood, normal affect.    Laboratory Data:  Recent Labs     01/01/23  0600 01/02/23  0608 01/03/23  0612   WBC 16.4* 11.7* 7.9   RBC 4.39 4.41 4.35   HGB 13.1 13.2 13.3   HCT 39.0 39.2 37.9*   MCV 88.8 88.9 87.1   RDW 13.0 13.2 13.2   PLT 1* 1* 1*     Recent Labs     01/02/23  0608   NA 137   K 4.1   CL 104   CO2 25   BUN 33*   CREATININE 0.7   GLUCOSE 94     No results for input(s): "AST", "ALT", "BILIDIR", "BILITOT",  "ALKPHOS" in the last 72 hours.      No results for input(s): "PROTIME", "INR" in the last 72 hours.    No results for input(s): "APTT" in the last 72 hours.    No results found for: "FIBRINOGEN"   No results found for: "LABURIC", "URICACID"   Lab Results   Component Value Date    LDH 173 12/29/2022        No results found for: "IRON", "TIBC", "FERRITIN"   Lab Results   Component Value Date    VITAMINB12 362 12/30/2022      No results found for: "FOLATE"     No results found for: "SPEP", "UPEP"   No results found for: "KLC"   No results found for: "LLC"     Diagnostic Imaging:  US ABDOMEN COMPLETE  Narrative: Complete abdominal ultrasound: 12/29/22    INDICATION: "thrombocytopenia, evaluate for any abnormalities with liver or   spleen".    COMPARISON:    TECHNIQUE: Multiple gray-scale and color Doppler images of the abdomen.    FINDINGS:  The gallbladder is normal, no gallstones. The gallbladder wall measures 2 mm in   thickness.  CBD 2 mm cm, CHD 3.2 mm cm.  Visualized portions of the pancreas are normal. The liver is normal in size and   echotexture, measuring 14.5 cm cm.   The vena cava is patent Aorta is normal 2.1 cm proximally, 1.8 cm mid, 1.0 cm   distally.    The right kidney Is normal in echogenicity, Normal renal collecting system, no   hydronephrosis., renal diameter 9.9 cm. Small 1 cm cyst, anechoic.  The left kidney Is normal in echogenicity, Normal collecting system, no   hydronephrosis.. Renal diameter is 1.5 cm.  The spleen is normal in appearance, measuring 9.4 in diameter.  Impression: 1. Normal abdominal ultrasound.   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  HIV, Hep C, ANA negative.   - Got first dose weekly rituximab 375 mg/m2 on  05/09. Plan for 2nd dose 5/16.  - Gave a dose of 17mcg/kg Nplate on 05/10. Will give additional 61mcg/kg today (for total of 64mcg/kg this  week) for more moderate dosing given severity of thrombocytopenia.  - Five doses dex 40mg  05/06-05/10.   - IVIG 30g 05/06-05/08.  No other etiologies seem likely with normal RBC morphology and count, normal renal function, normal LDH, and no recent blood transfusions in the weeks preceding admission. Continue to await platelet recovery. May look to do BMBx if continued slow response. Will attempt higher dose of Nplate today.    2.  History of paroxysmal atrial fibrillation: Anticoagulation held on admission.  Continue to hold with severe thrombocytopenia.    Orpah Melter, MD

## 2023-01-03 NOTE — Plan of Care (Signed)
Problem: ABCDS Injury Assessment  Goal: Absence of physical injury  Outcome: Progressing     Problem: Pain  Goal: Verbalizes/displays adequate comfort level or baseline comfort level  Outcome: Progressing     Problem: Safety - Adult  Goal: Free from fall injury  Outcome: Progressing     Problem: Hematologic - Adult  Goal: Maintains hematologic stability  Outcome: Not Progressing

## 2023-01-04 LAB — CBC WITH AUTO DIFFERENTIAL
Basophils %: 0.2 % (ref 0.0–2.0)
Basophils Absolute: 0 10*3/uL (ref 0.0–0.2)
Eosinophils %: 2.9 % (ref 0.0–7.0)
Eosinophils Absolute: 0.2 10*3/uL (ref 0.0–0.5)
Hematocrit: 38.1 % (ref 38.0–52.0)
Hemoglobin: 13 g/dL (ref 13.0–17.3)
Immature Grans (Abs): 0.18 10*3/uL — ABNORMAL HIGH (ref 0.00–0.06)
Immature Granulocytes %: 2.8 % — ABNORMAL HIGH (ref 0.0–0.6)
Lymphocytes Absolute: 1.3 10*3/uL (ref 1.0–3.2)
Lymphocytes: 19.9 % (ref 15.0–45.0)
MCH: 30 pg (ref 27.0–34.5)
MCHC: 34.1 g/dL (ref 32.0–36.0)
MCV: 88 fL (ref 84.0–100.0)
Monocytes %: 12.2 % — ABNORMAL HIGH (ref 4.0–12.0)
Monocytes Absolute: 0.8 10*3/uL (ref 0.3–1.0)
NRBC Absolute: 0.02 10*3/uL — ABNORMAL HIGH (ref 0.000–0.012)
NRBC Automated: 0.3 % — ABNORMAL HIGH (ref 0.0–0.2)
Neutrophils %: 62 % (ref 42.0–74.0)
Neutrophils Absolute: 4 10*3/uL (ref 1.6–7.3)
Platelets: 2 10*3/uL — CL (ref 140–440)
RBC: 4.33 x10e6/mcL (ref 4.00–5.60)
RDW: 13.3 % (ref 11.0–16.0)
WBC: 6.5 10*3/uL (ref 3.8–10.6)

## 2023-01-04 LAB — H. PYLORI ANTIGEN: HELICOBACTER PYLORI ANTIGEN, STOOL: NEGATIVE

## 2023-01-04 LAB — BASIC METABOLIC PANEL
Anion Gap: 7 mmol/L (ref 2–17)
BUN: 24 mg/dL — ABNORMAL HIGH (ref 8–23)
CO2: 26 mmol/L (ref 22–29)
Calcium: 8.1 mg/dL — ABNORMAL LOW (ref 8.5–10.7)
Chloride: 105 mmol/L (ref 98–107)
Creatinine: 0.7 mg/dL (ref 0.7–1.3)
Est, Glom Filt Rate: 91 mL/min/1.73m (ref >=60)
Glucose: 112 mg/dL — ABNORMAL HIGH (ref 70–99)
Osmolaliy Calculated: 280 mOsm/kg (ref 270–287)
Potassium: 3.9 mmol/L (ref 3.5–5.3)
Sodium: 138 mmol/L (ref 135–145)

## 2023-01-04 LAB — IMMATURE PLATELET FRACTION
Immature Platelet, Absolute: 0.2 10*3/uL
Immature Platelet, Percent: 9.7 % — ABNORMAL HIGH (ref 1.2–8.6)

## 2023-01-04 MED FILL — NORMAL SALINE FLUSH 0.9 % IV SOLN: 0.9 % | INTRAVENOUS | Qty: 10

## 2023-01-04 MED FILL — VITAMIN D3 25 MCG (1000 UT) PO TABS: 25 MCG (1000 UT) | ORAL | Qty: 1

## 2023-01-04 MED FILL — ROSUVASTATIN CALCIUM 10 MG PO TABS: 10 MG | ORAL | Qty: 1

## 2023-01-04 MED FILL — THERA PO TABS: ORAL | Qty: 1

## 2023-01-04 MED FILL — PROTONIX 40 MG IV SOLR: 40 MG | INTRAVENOUS | Qty: 40

## 2023-01-04 MED FILL — ATENOLOL 25 MG PO TABS: 25 MG | ORAL | Qty: 2

## 2023-01-04 NOTE — Plan of Care (Signed)
Problem: Hematologic - Adult  Goal: Maintains hematologic stability  01/04/2023 1039 by Theron Arista, RN  Outcome: Progressing  01/03/2023 2255 by Hortencia Conradi, RN  Outcome: Not Progressing

## 2023-01-04 NOTE — Progress Notes (Signed)
[facility] Hospitalist Service    PROGRESS NOTE  Luis Wilcox   01/04/23     SUBJECTIVE  Patient has arm bruising. 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 Family has RA. Plts are 2.        PHYSICAL EXAM  Vitals:    01/04/23 0534 01/04/23 0559 01/04/23 0838 01/04/23 0900   BP: 134/88  (!) 127/96 138/86   Pulse: 80  76 78   Resp: 20   18   Temp: 97.9 F (36.6 C)   98 F (36.7 C)   TempSrc: Oral   Oral   SpO2: 98%   100%   Weight:  69.4 kg (153 lb)     Height:       Patient is awake and alert.  He is pleasant and talkative.  He is walking freely in his room, nontoxic-appearing.  Heart is regular.  Work of breathing is normal.  Lungs seem clear.  Abdomen is soft and nontender.  He has some ecchymoses on his right elbow, stable.  He has petechiae on both forelegs, unchanged from prior examination.      LABS  CBC:   Lab Results   Component Value Date/Time    WBC 6.5 01/04/2023 05:38 AM    HGB 13.0 01/04/2023 05:38 AM    HCT 38.1 01/04/2023 05:38 AM    MCV 88.0 01/04/2023 05:38 AM    PLT 2 01/04/2023 05:38 AM     BMP:   Lab Results   Component Value Date/Time    NA 138 01/04/2023 05:38 AM    K 3.9 01/04/2023 05:38 AM    CL 105 01/04/2023 05:38 AM    CO2 26 01/04/2023 05:38 AM    BUN 24 01/04/2023 05:38 AM    CREATININE 0.7 01/04/2023 05:38 AM    CALCIUM 8.1 01/04/2023 05:38 AM         DIAGNOSTIC STUDIES        ASSESSMENT/PLAN  1.  Severe thrombocytopenia:  ITP  - Rituxan 2nd dose tomorrow 5/16  - Nplate 5/10 and 1/61  - dex 5/6-10  - IVIG 5/6-5/8  - slow response with plt 2 today    2.  Atrial fibrillation:  This is paroxysmal.  Anticoagulation was stopped because of severe thrombocytopenia.  Continue usual atenolol.    3.  Benign essential hypertension:  He had some transient elevation in his blood pressure above normal range.  No additional treatment is needed.  Continue usual atenolol.    4.  Discolored stool:  He has had some dark stool, but without obvious melena or hematochezia.  We doubt acute  bleeding.  We have given him empiric PPI.  He has not developed anemia.  Nursing staff and patient will continue to monitor for any sign of GI bleeding.  - no bleeding today      Continue inpatient care for ITP    Electronically signed by Roxy Horseman, MD on 01/04/2023 at 11:04 AM

## 2023-01-05 LAB — CBC WITH AUTO DIFFERENTIAL
Basophils %: 0.2 % (ref 0.0–2.0)
Basophils Absolute: 0 10*3/uL (ref 0.0–0.2)
Eosinophils %: 3.8 % (ref 0.0–7.0)
Eosinophils Absolute: 0.2 10*3/uL (ref 0.0–0.5)
Hematocrit: 38.1 % (ref 38.0–52.0)
Hemoglobin: 13.1 g/dL (ref 13.0–17.3)
Immature Grans (Abs): 0.08 10*3/uL — ABNORMAL HIGH (ref 0.00–0.06)
Immature Granulocytes %: 1.4 % — ABNORMAL HIGH (ref 0.0–0.6)
Lymphocytes Absolute: 1.5 10*3/uL (ref 1.0–3.2)
Lymphocytes: 25.4 % (ref 15.0–45.0)
MCH: 30.3 pg (ref 27.0–34.5)
MCHC: 34.4 g/dL (ref 32.0–36.0)
MCV: 88 fL (ref 84.0–100.0)
Monocytes %: 9.8 % (ref 4.0–12.0)
Monocytes Absolute: 0.6 10*3/uL (ref 0.3–1.0)
NRBC Absolute: 0 10*3/uL (ref 0.000–0.012)
NRBC Automated: 0 % (ref 0.0–0.2)
Neutrophils %: 59.4 % (ref 42.0–74.0)
Neutrophils Absolute: 3.4 10*3/uL (ref 1.6–7.3)
Platelets: 3 10*3/uL — CL (ref 140–440)
RBC: 4.33 x10e6/mcL (ref 4.00–5.60)
RDW: 13.3 % (ref 11.0–16.0)
WBC: 5.7 10*3/uL (ref 3.8–10.6)

## 2023-01-05 LAB — IMMATURE PLATELET FRACTION
Immature Platelet, Absolute: 0.8 10*3/uL
Immature Platelet, Percent: 26.9 % — ABNORMAL HIGH (ref 1.2–8.6)

## 2023-01-05 MED FILL — ATENOLOL 25 MG PO TABS: 25 MG | ORAL | Qty: 2

## 2023-01-05 MED FILL — PROTONIX 40 MG IV SOLR: 40 MG | INTRAVENOUS | Qty: 40

## 2023-01-05 MED FILL — THERA PO TABS: ORAL | Qty: 1

## 2023-01-05 MED FILL — NPLATE 250 MCG SC SOLR: 250 MCG | SUBCUTANEOUS | Qty: 0.5

## 2023-01-05 MED FILL — VITAMIN D3 25 MCG (1000 UT) PO TABS: 25 MCG (1000 UT) | ORAL | Qty: 1

## 2023-01-05 MED FILL — NORMAL SALINE FLUSH 0.9 % IV SOLN: 0.9 % | INTRAVENOUS | Qty: 10

## 2023-01-05 MED FILL — ROSUVASTATIN CALCIUM 10 MG PO TABS: 10 MG | ORAL | Qty: 1

## 2023-01-05 NOTE — Progress Notes (Signed)
Pt was unavailable when I attempted to visit. Will attempt to f/u at another time. PC services will continue to be available. Please let us know via Telemediq of any emergent spiritual/emotional concerns.      [facility] Pastoral Care [Mon-Friday  8 AM-4 PM]   and   [facility]_Pastoral Care/Chaplain (After hours and Weekends)     Chaplain Cameron Ali, MDiv, [facility]  Pastoral Care

## 2023-01-05 NOTE — Plan of Care (Signed)
Problem: ABCDS Injury Assessment  Goal: Absence of physical injury  Outcome: Progressing     Problem: Pain  Goal: Verbalizes/displays adequate comfort level or baseline comfort level  Outcome: Progressing     Problem: Safety - Adult  Goal: Free from fall injury  Outcome: Progressing     Problem: Hematologic - Adult  Goal: Maintains hematologic stability  Outcome: Progressing

## 2023-01-05 NOTE — Progress Notes (Signed)
[facility] Hospitalist Service    PROGRESS NOTE  Luis Wilcox   01/05/23     SUBJECTIVE  Patient denies any abdominal/GI bleeding.  Continues to have bruising throughout his arms.  He is washing his face this morning.  He is standing and walking comfortably.        PHYSICAL EXAM  Vitals:    01/05/23 0009 01/05/23 0349 01/05/23 0746 01/05/23 0926   BP: 113/77 129/77 106/89 106/89   Pulse: 66 66 72    Resp: 16 17 16     Temp: 97.9 F (36.6 C) 97.8 F (36.6 C) 97.9 F (36.6 C)    TempSrc: Oral Oral Oral    SpO2: 97% 98% 98%    Weight:       Height:       Patient is awake and alert.  He is pleasant and talkative.  He is walking freely in his room, nontoxic-appearing.  Heart is regular.  Work of breathing is normal.  Lungs seem clear.  Abdomen is soft and nontender.  He has some ecchymoses on his right elbow, stable.  He has petechiae on both forelegs, unchanged from prior examination.      LABS  CBC:   Lab Results   Component Value Date/Time    WBC 5.7 01/05/2023 05:50 AM    HGB 13.1 01/05/2023 05:50 AM    HCT 38.1 01/05/2023 05:50 AM    MCV 88.0 01/05/2023 05:50 AM    PLT 3 01/05/2023 05:50 AM     BMP:   Lab Results   Component Value Date/Time    NA 138 01/04/2023 05:38 AM    K 3.9 01/04/2023 05:38 AM    CL 105 01/04/2023 05:38 AM    CO2 26 01/04/2023 05:38 AM    BUN 24 01/04/2023 05:38 AM    CREATININE 0.7 01/04/2023 05:38 AM    CALCIUM 8.1 01/04/2023 05:38 AM         DIAGNOSTIC STUDIES        ASSESSMENT/PLAN  1.  Severe thrombocytopenia:  ITP  - Rituxan 2nd dose tomorrow 5/17  - Nplate 5/10 and  5/15, 5 mcg/kg today  - dex 40 mg 5/6-10  - IVIG 5/6-5/8  - slow response with plt 3 today.  Hematology following.  Considering discharge once platelets are consistently above 10,000 and given his slow response    2.  Atrial fibrillation:  This is paroxysmal.  Anticoagulation was stopped because of severe thrombocytopenia.  Continue usual atenolol.    3.  Benign essential hypertension:  He had some transient elevation in his  blood pressure above normal range.  No additional treatment is needed.  Continue usual atenolol.    4.  Discolored stool:  He has had some dark stool, but without obvious melena or hematochezia.  We doubt acute bleeding.  We have given him empiric PPI.  He has not developed anemia.  Nursing staff and patient will continue to monitor for any sign of GI bleeding.  - no bleeding today      Continue inpatient care for ITP    Electronically signed by Roxy Horseman, MD on 01/05/2023 at 10:48 AM

## 2023-01-05 NOTE — Progress Notes (Signed)
Progress Note            Date:01/05/2023        Patient Name:Luis Wilcox       Date of Birth:1940/09/04         Subjective:  No events overnight. Denies any bleeding.     Medications:  Prior to Admission medications    Medication Sig Start Date End Date Taking? Authorizing Provider   alendronate (FOSAMAX) 70 MG tablet Take 1 tablet by mouth every 7 days Sunday   Yes [provider]   atenolol (TENORMIN) 50 MG tablet Take 1 tablet by mouth 2 times daily   Yes [provider]   clobetasol (TEMOVATE) 0.05 % cream Apply 1 each topically as needed (skin flares)   Yes [provider]   rivaroxaban (XARELTO) 20 MG TABS tablet Take 1 tablet by mouth daily   Yes [provider]   rosuvastatin (CRESTOR) 10 MG tablet Take 1 tablet by mouth every evening   Yes [provider]   tadalafil (CIALIS) 5 MG tablet Take 1-4 tablets by mouth daily as needed for Erectile Dysfunction   Yes [provider]   calcium carbonate (TUMS) 500 MG chewable tablet Take 1-2 tablets by mouth nightly as needed for Heartburn   Yes [provider]   Multiple Vitamins-Minerals (PRESERVISION AREDS 2) CAPS Take 1 capsule by mouth in the morning and at bedtime   Yes [provider]   Multiple Vitamin (MULTIVITAMIN) TABS tablet Take 1 tablet by mouth daily   Yes [provider]   Vitamin D (CHOLECALCIFEROL) 25 MCG (1000 UT) TABS tablet Take 1 tablet by mouth daily   Yes [provider]   Omega-3 Fatty Acids (FISH OIL) 1000 MG capsule Take 1 capsule by mouth daily   Yes [provider]   fluticasone (FLONASE) 50 MCG/ACT nasal spray 1 spray by Each Nostril route nightly as needed for Rhinitis or Allergies   Yes [provider]        Glucosamine Chondroitin 1 tablet pt supplied, BID  PreserVision capsule pt supplied, BID  diphenhydrAMINE (BENADRYL) injection 25 mg, Once  meperidine (DEMEROL) injection 12.5 mg, PRN  EPINEPHrine 1 MG/ML injection 0.3  mg, PRN  pantoprazole (PROTONIX) injection 40 mg, BID  atenolol (TENORMIN) tablet 50 mg, BID  multivitamin 1 tablet, Daily  Vitamin D (CHOLECALCIFEROL) tablet 1,000 Units, Daily  antioxidant multivitamin (OCUVITE) tablet, Daily  rosuvastatin (CRESTOR) tablet 10 mg, Nightly  sodium chloride flush 0.9 % injection 5-40 mL, 2 times per day  sodium chloride flush 0.9 % injection 5-40 mL, PRN  0.9 % sodium chloride infusion, PRN  ondansetron (ZOFRAN-ODT) disintegrating tablet 4 mg, Q8H PRN   Or  ondansetron (ZOFRAN) injection 4 mg, Q6H PRN  polyethylene glycol (GLYCOLAX) packet 17 g, Daily PRN  acetaminophen (TYLENOL) tablet 650 mg, Q6H PRN   Or  acetaminophen (TYLENOL) suppository 650 mg, Q6H PRN        Physical Exam:  BP 106/89   Pulse 72   Temp 97.9 F (36.6 C) (Oral)   Resp 16   Ht 1.75 m (5' 8.9")   Wt 69.4 kg (153 lb)   SpO2 98%   BMI 22.66 kg/m     GEN: Appears well, in no apparent distress.  HEENT: EOMI. No scleral icterus. Normocephalic, atraumatic.    CV: Regular rate and rhythm. No murmurs rubs or gallops.  RESP: Breathing non-labored. Clear breath sounds bilaterally.  ABD: Soft, nontender, nondistended. No rebound or  guarding. No hepatomegaly. No splenomegaly.  MSK: Normal strength and range of motion. No tenderness or swelling.  SKIN: Persistent petechiae on lower extremities, improving.  NEURO: Alert, oriented x3. CN II - XII grossly intact.  PSYCH: Appropriate mood, normal affect.    Laboratory Data:  Recent Labs     01/03/23  0612 01/04/23  0538 01/05/23  0550   WBC 7.9 6.5 5.7   RBC 4.35 4.33 4.33   HGB 13.3 13.0 13.1   HCT 37.9* 38.1 38.1   MCV 87.1 88.0 88.0   RDW 13.2 13.3 13.3   PLT 1* 2* 3*     Recent Labs     01/04/23  0538   NA 138   K 3.9   CL 105   CO2 26   BUN 24*   CREATININE 0.7   GLUCOSE 112*     No results for input(s): "AST", "ALT", "BILIDIR", "BILITOT", "ALKPHOS" in the last 72 hours.      No results for input(s): "PROTIME", "INR" in the last 72 hours.    No results for input(s):  "APTT" in the last 72 hours.    No results found for: "FIBRINOGEN"   No results found for: "LABURIC", "URICACID"   Lab Results   Component Value Date    LDH 173 12/29/2022        No results found for: "IRON", "TIBC", "FERRITIN"   Lab Results   Component Value Date    VITAMINB12 362 12/30/2022      No results found for: "FOLATE"     No results found for: "SPEP", "UPEP"   No results found for: "KLC"   No results found for: "LLC"     Diagnostic Imaging:  US ABDOMEN COMPLETE  Narrative: Complete abdominal ultrasound: 12/29/22    INDICATION: "thrombocytopenia, evaluate for any abnormalities with liver or   spleen".    COMPARISON:    TECHNIQUE: Multiple gray-scale and color Doppler images of the abdomen.    FINDINGS:  The gallbladder is normal, no gallstones. The gallbladder wall measures 2 mm in   thickness.  CBD 2 mm cm, CHD 3.2 mm cm.  Visualized portions of the pancreas are normal. The liver is normal in size and   echotexture, measuring 14.5 cm cm.   The vena cava is patent Aorta is normal 2.1 cm proximally, 1.8 cm mid, 1.0 cm   distally.    The right kidney Is normal in echogenicity, Normal renal collecting system, no   hydronephrosis., renal diameter 9.9 cm. Small 1 cm cyst, anechoic.  The left kidney Is normal in echogenicity, Normal collecting system, no   hydronephrosis.. Renal diameter is 1.5 cm.  The spleen is normal in appearance, measuring 9.4 in diameter.  Impression: 1. Normal abdominal ultrasound.   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  HIV, Hep C, ANA negative.   - Got first dose weekly rituximab 375 mg/m2 on 05/09. Plan for 2nd dose 5/16.  - Gave a dose of 42mcg/kg Nplate on 05/10. Plan for 5 mcg/kg today.   - Five doses dex 40mg   05/06-05/10.   - IVIG 30g 05/06-05/08.  No other etiologies seem likely with normal  RBC morphology and count, normal renal function, normal LDH, and no recent blood transfusions in the weeks preceding admission. Continue to await platelet recovery. Consider discharge when platelets are consistently above 10,000.    2.  History of paroxysmal atrial fibrillation: Anticoagulation held on admission.  Continue to hold with severe thrombocytopenia.    Orpah Melter, MD

## 2023-01-05 NOTE — Care Coordination-Inpatient (Signed)
01/05/23:  BMP  CM Note.  Reviewed chart.  Spoke with hospitalist.  Hematology following pt for d/c from [facility].  CM remains available for identified d/c needs.   12/28/22 0844   Service Assessment   Patient Orientation Alert and Oriented   Cognition Alert   History Provided By Patient   Primary Caregiver Self   Support Systems Spouse/Significant Other   Patient's Healthcare Decision Maker is: Named in Scanned ACP Document   PCP Verified by CM Yes  (In [place])   Prior Functional Level Independent in ADLs/IADLs   Current Functional Level Independent in ADLs/IADLs   Can patient return to prior living arrangement Yes   Ability to make needs known: Good   Family able to assist with home care needs: Yes   Would you like for me to discuss the discharge plan with any other family members/significant others, and if so, who? Yes  (Spouse)   Chief Executive Officer;Other (Comment)   Community Resources None   Social/Functional History   Lives With Spouse   Type of Home House   Receives Help From Family   ADL Assistance Independent   Homemaking Assistance Independent   Ambulation Assistance Independent   Transfer Assistance Independent   Active Driver Yes   Mode of Engineer, water   Occupation Full time employment   Discharge Planning   Type of Residence House   Living Arrangements Spouse/Significant Other   Current Services Prior To Admission C-pap   Potential Assistance Needed N/A   DME Ordered? No   Potential Assistance Purchasing Medications No   Type of Home Care Services None   Patient expects to be discharged to: House   History of falls? 0   Services At/After Discharge   Transition of Care Consult (CM Consult) N/A   Services At/After Discharge None   [employer] Information Provided? Yes   Mode of Transport at Discharge Self   Confirm Follow Up Transport Family   Condition of Participation: Discharge Planning   The Plan for Transition of Care is related to the following treatment goals: Home

## 2023-01-06 ENCOUNTER — Encounter

## 2023-01-06 LAB — CBC WITH AUTO DIFFERENTIAL
Basophils %: 0 % (ref 0.0–2.0)
Basophils Absolute: 0 10*3/uL (ref 0.0–0.2)
Eosinophils %: 2.3 % (ref 0.0–7.0)
Eosinophils Absolute: 0.2 10*3/uL (ref 0.0–0.5)
Hematocrit: 37.4 % — ABNORMAL LOW (ref 38.0–52.0)
Hemoglobin: 12.7 g/dL — ABNORMAL LOW (ref 13.0–17.3)
Immature Grans (Abs): 0.06 10*3/uL (ref 0.00–0.06)
Immature Granulocytes %: 0.9 % — ABNORMAL HIGH (ref 0.0–0.6)
Lymphocytes Absolute: 1 10*3/uL (ref 1.0–3.2)
Lymphocytes: 14.7 % — ABNORMAL LOW (ref 15.0–45.0)
MCH: 29.9 pg (ref 27.0–34.5)
MCHC: 34 g/dL (ref 32.0–36.0)
MCV: 88 fL (ref 84.0–100.0)
MPV: 13.5 fL — ABNORMAL HIGH (ref 7.2–13.2)
Monocytes %: 9.8 % (ref 4.0–12.0)
Monocytes Absolute: 0.6 10*3/uL (ref 0.3–1.0)
NRBC Absolute: 0 10*3/uL (ref 0.000–0.012)
NRBC Automated: 0 % (ref 0.0–0.2)
Neutrophils %: 72.3 % (ref 42.0–74.0)
Neutrophils Absolute: 4.7 10*3/uL (ref 1.6–7.3)
Platelets: 10 10*3/uL — CL (ref 140–440)
RBC: 4.25 x10e6/mcL (ref 4.00–5.60)
RDW: 13.6 % (ref 11.0–16.0)
WBC: 6.5 10*3/uL (ref 3.8–10.6)

## 2023-01-06 LAB — BASIC METABOLIC PANEL
Anion Gap: 10 mmol/L (ref 2–17)
BUN: 23 mg/dL (ref 8–23)
CO2: 22 mmol/L (ref 22–29)
Calcium: 8.4 mg/dL — ABNORMAL LOW (ref 8.5–10.7)
Chloride: 105 mmol/L (ref 98–107)
Creatinine: 0.8 mg/dL (ref 0.7–1.3)
Est, Glom Filt Rate: 88 mL/min/1.73m (ref >=60)
Glucose: 109 mg/dL — ABNORMAL HIGH (ref 70–99)
Osmolaliy Calculated: 278 mOsm/kg (ref 270–287)
Potassium: 4 mmol/L (ref 3.5–5.3)
Sodium: 137 mmol/L (ref 135–145)

## 2023-01-06 LAB — IMMATURE PLATELET FRACTION
Immature Platelet, Absolute: 2.3 10*3/uL
Immature Platelet, Percent: 22.6 % — ABNORMAL HIGH (ref 1.2–8.6)

## 2023-01-06 MED ORDER — SODIUM CHLORIDE 0.9 % IV SOLN
0.9 | INTRAVENOUS | Status: AC
Start: 2023-01-06 — End: 2023-01-07

## 2023-01-06 MED ORDER — ALBUTEROL SULFATE HFA 108 (90 BASE) MCG/ACT IN AERS
108 | RESPIRATORY_TRACT | Status: AC | PRN
Start: 2023-01-06 — End: 2023-01-07

## 2023-01-06 MED ORDER — ACETAMINOPHEN 325 MG PO TABS
325 | Freq: Once | ORAL | Status: AC | PRN
Start: 2023-01-06 — End: 2023-01-07

## 2023-01-06 MED ORDER — STERILE WATER FOR INJECTION (MIXTURES ONLY)
2 | INTRAMUSCULAR | Status: AC | PRN
Start: 2023-01-06 — End: 2023-01-07

## 2023-01-06 MED ORDER — SODIUM CHLORIDE 0.9 % IV SOLN
0.9 | Freq: Once | INTRAVENOUS | Status: AC
Start: 2023-01-06 — End: 2023-01-06
  Administered 2023-01-06: 18:00:00 700 mg/m2 via INTRAVENOUS

## 2023-01-06 MED ORDER — DIPHENHYDRAMINE HCL 50 MG/ML IJ SOLN
50 | Freq: Once | INTRAMUSCULAR | Status: AC | PRN
Start: 2023-01-06 — End: 2023-01-07

## 2023-01-06 MED ORDER — METHYLPREDNISOLONE NA SUC (PF) 125 MG IJ SOLR
125 | Freq: Once | INTRAMUSCULAR | Status: AC
Start: 2023-01-06 — End: 2023-01-06
  Administered 2023-01-06: 17:00:00 125 mg via INTRAVENOUS

## 2023-01-06 MED ORDER — ACETAMINOPHEN 325 MG PO TABS
325 | Freq: Once | ORAL | Status: AC
Start: 2023-01-06 — End: 2023-01-06
  Administered 2023-01-06: 17:00:00 650 mg via ORAL

## 2023-01-06 MED ORDER — LIDOCAINE HCL (PF) 1 % IJ SOLN
1 | Freq: Once | INTRAMUSCULAR | Status: AC | PRN
Start: 2023-01-06 — End: 2023-01-07

## 2023-01-06 MED ORDER — GRANISETRON HCL 1 MG/ML IV SOLN
1 | Freq: Once | INTRAVENOUS | Status: AC | PRN
Start: 2023-01-06 — End: 2023-01-07

## 2023-01-06 MED ORDER — NORMAL SALINE FLUSH 0.9 % IV SOLN
0.9 | INTRAVENOUS | Status: AC | PRN
Start: 2023-01-06 — End: 2023-01-07

## 2023-01-06 MED ORDER — ONDANSETRON HCL 4 MG/2ML IJ SOLN
4 | Freq: Once | INTRAMUSCULAR | Status: AC | PRN
Start: 2023-01-06 — End: 2023-01-07

## 2023-01-06 MED ORDER — HYDROCORTISONE SOD SUC (PF) 100 MG IJ SOLR
100 | Freq: Once | INTRAMUSCULAR | Status: AC | PRN
Start: 2023-01-06 — End: 2023-01-07

## 2023-01-06 MED ORDER — SODIUM CHLORIDE 0.9 % IV SOLN
0.9 | INTRAVENOUS | Status: AC | PRN
Start: 2023-01-06 — End: 2023-01-07

## 2023-01-06 MED ORDER — MEPERIDINE HCL 25 MG/ML IJ SOLN
25 MG/ML | INTRAMUSCULAR | Status: DC | PRN
Start: 2023-01-06 — End: 2023-01-07

## 2023-01-06 MED ORDER — EPINEPHRINE (ANAPHYLAXIS) 1 MG/ML IJ SOLN
1 MG/ML | INTRAMUSCULAR | Status: DC | PRN
Start: 2023-01-06 — End: 2023-01-07

## 2023-01-06 MED ORDER — DIPHENHYDRAMINE HCL 50 MG/ML IJ SOLN
50 | Freq: Once | INTRAMUSCULAR | Status: AC
Start: 2023-01-06 — End: 2023-01-06
  Administered 2023-01-06: 17:00:00 25 mg via INTRAVENOUS

## 2023-01-06 MED ORDER — HEPARIN NA (PORK) LOCK FLSH PF 100 UNIT/ML IV SOLN
100 | INTRAVENOUS | Status: AC | PRN
Start: 2023-01-06 — End: 2023-01-07

## 2023-01-06 MED ORDER — SODIUM CHLORIDE (PF) 0.9 % IJ SOLN
0.9 | Freq: Once | INTRAMUSCULAR | Status: AC | PRN
Start: 2023-01-06 — End: 2023-01-07

## 2023-01-06 MED FILL — PROTONIX 40 MG IV SOLR: 40 MG | INTRAVENOUS | Qty: 40

## 2023-01-06 MED FILL — TRUXIMA 500 MG/50ML IV SOLN: 500 MG/50ML | INTRAVENOUS | Qty: 70

## 2023-01-06 MED FILL — ATENOLOL 25 MG PO TABS: 25 MG | ORAL | Qty: 2

## 2023-01-06 MED FILL — VITAMIN D3 25 MCG (1000 UT) PO TABS: 25 MCG (1000 UT) | ORAL | Qty: 1

## 2023-01-06 MED FILL — NORMAL SALINE FLUSH 0.9 % IV SOLN: 0.9 % | INTRAVENOUS | Qty: 10

## 2023-01-06 MED FILL — TYLENOL 325 MG PO TABS: 325 MG | ORAL | Qty: 2

## 2023-01-06 MED FILL — DIPHENHYDRAMINE HCL 50 MG/ML IJ SOLN: 50 MG/ML | INTRAMUSCULAR | Qty: 1

## 2023-01-06 MED FILL — THERA PO TABS: ORAL | Qty: 1

## 2023-01-06 MED FILL — NORMAL SALINE FLUSH 0.9 % IV SOLN: 0.9 % | INTRAVENOUS | Qty: 30

## 2023-01-06 MED FILL — SOLU-MEDROL (PF) 125 MG IJ SOLR: 125 MG | INTRAMUSCULAR | Qty: 125

## 2023-01-06 MED FILL — ROSUVASTATIN CALCIUM 10 MG PO TABS: 10 MG | ORAL | Qty: 1

## 2023-01-06 NOTE — Consults (Addendum)
ULTRASOUND GUIDED PERIPHERAL IV PLACED    INDICATION: IV Access Required, Multiple unsuccessful attempts to obtain peripheral IV access by nursing staff, and Large bore/Large vessel to facilitate care plan    INSERTED BY: Andreas Ohm, BSN, RN, CEN      The patient's RIGHT arm was prepped aseptically. Local anesthesia was obtained with 0.71mL of 1% lidocaine. Noted MID FOREARM vein compressible.  The MID FOREARM  vein was accessed with an 20 gauge gauge catheter 1.75 inches in length. X 1 attempts. Positive blood return and flushed easily.  Visualized catheter within vein lumen via ultrasound.  10 mL of normal saline flush used to place access.  Stat-Lock and transparent dressing applied.    COMPLICATIONS: None    TOLERATED: well with no complications

## 2023-01-06 NOTE — Progress Notes (Signed)
[facility] Hospitalist Service    PROGRESS NOTE  Luis Wilcox   01/06/23     SUBJECTIVE  Patient is feeling well, denies any bleeding.  His platelet count is up to 10,000.  He is hopeful to leave the hospital tomorrow.  No severe fatigue, however he has not moved around the room much.  He is having regular bowel movements.        PHYSICAL EXAM  Vitals:    01/06/23 0520 01/06/23 0739 01/06/23 0858 01/06/23 1221   BP: 113/70 119/83 119/83 104/64   Pulse: 68 72  74   Resp: 16 16  18    Temp: 97.5 F (36.4 C) 97.5 F (36.4 C)  98.1 F (36.7 C)   TempSrc: Oral Oral  Oral   SpO2: 96% 99%  99%   Weight:       Height:       Patient is awake and alert.  He is pleasant and talkative.  He is walking freely in his room, nontoxic-appearing.  Heart is regular.  Work of breathing is normal.  Lungs seem clear.  Abdomen is soft and nontender.  He has some ecchymoses on his right elbow, stable.  He has petechiae on both forelegs, unchanged from prior examination.      LABS  CBC:   Lab Results   Component Value Date/Time    WBC 6.5 01/06/2023 05:29 AM    HGB 12.7 01/06/2023 05:29 AM    HCT 37.4 01/06/2023 05:29 AM    MCV 88.0 01/06/2023 05:29 AM    PLT 10 01/06/2023 05:29 AM     BMP:   Lab Results   Component Value Date/Time    NA 137 01/06/2023 05:29 AM    K 4.0 01/06/2023 05:29 AM    CL 105 01/06/2023 05:29 AM    CO2 22 01/06/2023 05:29 AM    BUN 23 01/06/2023 05:29 AM    CREATININE 0.8 01/06/2023 05:29 AM    CALCIUM 8.4 01/06/2023 05:29 AM         DIAGNOSTIC STUDIES        ASSESSMENT/PLAN  1.  Severe thrombocytopenia:  ITP  - Rituxan 2nd dose today 5/17  - Nplate 5/10 and  5/15, 5 mcg/kg   - dex 40 mg 5/6-10  - IVIG 5/6-5/8  - slow response however his platelet count has responded better today, and is at 10,000.  - Continue to monitor throughout the day today, recheck platelets in the morning, plan for discharge in the morning if they are stable.  He needs close follow-up with Orpah Melter in her clinic.  She is in [place] on  Wednesdays.    2.  Atrial fibrillation:  This is paroxysmal.  Anticoagulation was stopped because of severe thrombocytopenia.  Continue usual atenolol.    3.  Benign essential hypertension:  He had some transient elevation in his blood pressure above normal range.  No additional treatment is needed.  Continue usual atenolol.    4.  Discolored stool:  He has had some dark stool, but without obvious melena or hematochezia.  We doubt acute bleeding.  We have given him empiric PPI.  He has not developed anemia.  Nursing staff and patient will continue to monitor for any sign of GI bleeding.  - no bleeding today      Continue inpatient care for ITP, anticipate discharge tomorrow.  DVT prophylaxis with ambulation only    Electronically signed by Roxy Horseman, MD on 01/06/2023 at 12:23 PM

## 2023-01-06 NOTE — Progress Notes (Signed)
Progress Note            Date:01/06/2023        Patient Name:Luis Wilcox       Date of Birth:10-01-1940         Subjective:  Received Nplate yesterday. Platelets rising to 10,000 today. Denies bleeding overnight.    Medications:  Prior to Admission medications    Medication Sig Start Date End Date Taking? Authorizing Provider   alendronate (FOSAMAX) 70 MG tablet Take 1 tablet by mouth every 7 days Sunday   Yes [provider]   atenolol (TENORMIN) 50 MG tablet Take 1 tablet by mouth 2 times daily   Yes [provider]   clobetasol (TEMOVATE) 0.05 % cream Apply 1 each topically as needed (skin flares)   Yes [provider]   rivaroxaban (XARELTO) 20 MG TABS tablet Take 1 tablet by mouth daily   Yes [provider]   rosuvastatin (CRESTOR) 10 MG tablet Take 1 tablet by mouth every evening   Yes [provider]   tadalafil (CIALIS) 5 MG tablet Take 1-4 tablets by mouth daily as needed for Erectile Dysfunction   Yes [provider]   calcium carbonate (TUMS) 500 MG chewable tablet Take 1-2 tablets by mouth nightly as needed for Heartburn   Yes [provider]   Multiple Vitamins-Minerals (PRESERVISION AREDS 2) CAPS Take 1 capsule by mouth in the morning and at bedtime   Yes [provider]   Multiple Vitamin (MULTIVITAMIN) TABS tablet Take 1 tablet by mouth daily   Yes [provider]   Vitamin D (CHOLECALCIFEROL) 25 MCG (1000 UT) TABS tablet Take 1 tablet by mouth daily   Yes [provider]   Omega-3 Fatty Acids (FISH OIL) 1000 MG capsule Take 1 capsule by mouth daily   Yes [provider]   fluticasone (FLONASE) 50 MCG/ACT nasal spray 1 spray by Each Nostril route nightly as needed for Rhinitis or Allergies   Yes [provider]        Glucosamine Chondroitin 1 tablet pt supplied, BID  PreserVision capsule pt supplied, BID  diphenhydrAMINE (BENADRYL) injection 25 mg, Once  meperidine (DEMEROL) injection  12.5 mg, PRN  EPINEPHrine 1 MG/ML injection 0.3 mg, PRN  pantoprazole (PROTONIX) injection 40 mg, BID  atenolol (TENORMIN) tablet 50 mg, BID  multivitamin 1 tablet, Daily  Vitamin D (CHOLECALCIFEROL) tablet 1,000 Units, Daily  antioxidant multivitamin (OCUVITE) tablet, Daily  rosuvastatin (CRESTOR) tablet 10 mg, Nightly  sodium chloride flush 0.9 % injection 5-40 mL, 2 times per day  sodium chloride flush 0.9 % injection 5-40 mL, PRN  0.9 % sodium chloride infusion, PRN  ondansetron (ZOFRAN-ODT) disintegrating tablet 4 mg, Q8H PRN   Or  ondansetron (ZOFRAN) injection 4 mg, Q6H PRN  polyethylene glycol (GLYCOLAX) packet 17 g, Daily PRN  acetaminophen (TYLENOL) tablet 650 mg, Q6H PRN   Or  acetaminophen (TYLENOL) suppository 650 mg, Q6H PRN        Physical Exam:  BP 119/83   Pulse 72   Temp 97.5 F (36.4 C) (Oral)   Resp 16   Ht 1.75 m (5' 8.9")   Wt 69.4 kg (153 lb)   SpO2 99%   BMI 22.66 kg/m     GEN: Appears well, in no apparent distress.  HEENT: EOMI. No scleral icterus. Normocephalic, atraumatic.    CV: Regular rate and rhythm. No murmurs rubs or gallops.  RESP: Breathing non-labored. Clear breath sounds bilaterally.  ABD: Soft, nontender,  nondistended. No rebound or guarding. No hepatomegaly. No splenomegaly.  MSK: Normal strength and range of motion. No tenderness or swelling.  SKIN: Persistent petechiae on lower extremities, improving.  NEURO: Alert, oriented x3. CN II - XII grossly intact.  PSYCH: Appropriate mood, normal affect.    Laboratory Data:  Recent Labs     01/04/23  0538 01/05/23  0550 01/06/23  0529   WBC 6.5 5.7 6.5   RBC 4.33 4.33 4.25   HGB 13.0 13.1 12.7*   HCT 38.1 38.1 37.4*   MCV 88.0 88.0 88.0   RDW 13.3 13.3 13.6   PLT 2* 3* 10*     Recent Labs     01/04/23  0538 01/06/23  0529   NA 138 137   K 3.9 4.0   CL 105 105   CO2 26 22   BUN 24* 23   CREATININE 0.7 0.8   GLUCOSE 112* 109*     No results for input(s): "AST", "ALT", "BILIDIR", "BILITOT", "ALKPHOS" in the last 72  hours.      No results for input(s): "PROTIME", "INR" in the last 72 hours.    No results for input(s): "APTT" in the last 72 hours.    No results found for: "FIBRINOGEN"   No results found for: "LABURIC", "URICACID"   Lab Results   Component Value Date    LDH 173 12/29/2022        No results found for: "IRON", "TIBC", "FERRITIN"   Lab Results   Component Value Date    VITAMINB12 362 12/30/2022      No results found for: "FOLATE"     No results found for: "SPEP", "UPEP"   No results found for: "KLC"   No results found for: "LLC"     Diagnostic Imaging:  US ABDOMEN COMPLETE  Narrative: Complete abdominal ultrasound: 12/29/22    INDICATION: "thrombocytopenia, evaluate for any abnormalities with liver or   spleen".    COMPARISON:    TECHNIQUE: Multiple gray-scale and color Doppler images of the abdomen.    FINDINGS:  The gallbladder is normal, no gallstones. The gallbladder wall measures 2 mm in   thickness.  CBD 2 mm cm, CHD 3.2 mm cm.  Visualized portions of the pancreas are normal. The liver is normal in size and   echotexture, measuring 14.5 cm cm.   The vena cava is patent Aorta is normal 2.1 cm proximally, 1.8 cm mid, 1.0 cm   distally.    The right kidney Is normal in echogenicity, Normal renal collecting system, no   hydronephrosis., renal diameter 9.9 cm. Small 1 cm cyst, anechoic.  The left kidney Is normal in echogenicity, Normal collecting system, no   hydronephrosis.. Renal diameter is 1.5 cm.  The spleen is normal in appearance, measuring 9.4 in diameter.  Impression: 1. Normal abdominal ultrasound.   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  HIV, Hep C, ANA negative.   - Got first dose weekly rituximab 375 mg/m2 on 05/09. Plan for 2nd dose  today.  - Gave a dose of 15mcg/kg Nplate on 05/10. Received 5 mcg/kg on 01/05/23.   - Five doses dex 40mg  05/06-05/10.   -  IVIG 30g 05/06-05/08.  No other etiologies seem likely with normal RBC morphology and count, normal renal function, normal LDH, and no recent blood transfusions in the weeks preceding admission. Continue to await platelet recovery. Platelet count improved to 10,000 today. We discussed if this continues to rise with repeat CBC in the morning, stable for discharge and I will see him next Wednesday in clinic to continue weekly Rituximab/Nplate.    2.  History of paroxysmal atrial fibrillation: Anticoagulation held on admission.  Continue to hold with severe thrombocytopenia. Recommend holding until platelets consistently above 50,000 to reduce risk of bleeding.     Orpah Melter, MD

## 2023-01-06 NOTE — Progress Notes (Signed)
Comprehensive Nutrition Assessment    Type and Reason for Visit:  Reassess    Nutrition Recommendations/Plan:   Continue current diet + ensure high protein once daily (160 kcal, 16 g protein). Encouraged PO intake.      Malnutrition Assessment:  Malnutrition Status:  No malnutrition (01/06/23 1353)      Nutrition Assessment:    82 y.o. male admitted with bruising and petechiae. Dx Severe thrombocytopenia likely ITP, HTN.    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     Scheduled Meds:   alendronate  70 mg Oral Q7 Days    Glucosamine Chondroitin Adv  1 tablet Oral BID    ICAPS  1 tablet Oral BID    diphenhydrAMINE  25 mg IntraVENous Once    pantoprazole  40 mg IntraVENous BID    atenolol  50 mg Oral BID    multivitamin  1 tablet Oral Daily    Vitamin D  1,000 Units Oral Daily    rosuvastatin  10 mg Oral Nightly    sodium chloride flush  5-40 mL IntraVENous 2 times per day     Continuous Infusions:   sodium chloride      sodium chloride      sodium chloride      sodium chloride       PRN Meds:.sodium chloride, granisetron, sodium chloride flush, sodium chloride, heparin flush, ALTEplase (CATHFLO) 2 mg in sterile water 2 mL injection, lidocaine PF, diphenhydrAMINE, famotidine (PEPCID) injection, hydrocortisone sodium succinate PF, acetaminophen, meperidine, ondansetron, EPINEPHrine, albuterol sulfate HFA, meperidine, EPINEPHrine, sodium chloride flush, sodium chloride, ondansetron **OR** ondansetron, polyethylene glycol, acetaminophen **OR** acetaminophen    Lab Results   Component Value Date/Time    NA 137 01/06/2023 05:29 AM    K 4.0 01/06/2023 05:29 AM    CL 105 01/06/2023 05:29 AM    CO2 22 01/06/2023 05:29 AM    BUN 23 01/06/2023 05:29 AM    CREATININE 0.8 01/06/2023 05:29 AM    GLUCOSE 109 01/06/2023 05:29 AM    CALCIUM 8.4 01/06/2023 05:29 AM     No results found for: "TRIG"  No results found for: "POCGLU"    Current Nutrition Intake & Therapies:    Average Meal Intake: 76-100%     ADULT DIET;  Regular  ADULT ORAL NUTRITION SUPPLEMENT; PM Snack; Low Calorie/High Protein Oral Supplement, NKFA    Pt reports good appetite PTA, typically eats 3 meals/day at home but occasionally skips lunch when he is busy. This admit, good appetite, eating 100% of meals. Pt hasn't tried any of the ensure but agreeable to take once/day d/t underweight for age BMI. Denies N/V/D/C. LBM 5/14.     Nutrition Related Findings:    12/30/22: Mild muscle wasting of the temporalis/clavicles. Wound Type: None         Anthropometric Measures:  Height: 175 cm (5' 8.9")  Ideal Body Weight (IBW): 159 lbs (72 kg)       Current Body Weight: 69.4 kg (153 lb), 96.2 % IBW.    Current BMI (kg/m2): 22.7  BMI Categories: Underweight (BMI less than 22) age over 52    UBW: 155 lb/70.5 kg, denies wt loss PTA. No wt hx PTA per EMR.  Trends this admit:  70kg (12/27/22)  69.4 kg (5/14)-<1% wt loss since admit (non-severe)    Estimated Daily Nutrient Needs:  Energy Requirements Based On: Formula  Weight Used for Energy Requirements: Current  Energy (kcal/day): 1791 kcal (MSJ x 1.3, 26 kcal/kg)  Weight  Used for Protein Requirements: Current  Protein (g/day): 90 g (1.3 g/kg-20%EER)  Method Used for Fluid Requirements: 1 ml/kcal  Fluid (ml/day): 1791 ml or per MD  Carb (g/day): 224 g (50%EER)    Nutrition Diagnosis:   Underweight related to inadequate protein-energy intake as evidenced by BMI    Nutrition Interventions:   Food and/or Nutrient Delivery: Continue Current Diet, Continue Oral Nutrition Supplement             Goals:  Previous Goal Met: Progressing toward Goal(s)  Goals: Meet at least 75% of estimated needs, by next RD assessment, other (specify)  Specify Other Goals: Prevent weight loss.    Nutrition Monitoring and Evaluation:      Food/Nutrient Intake Outcomes: Food and Nutrient Intake, Supplement Intake  Physical Signs/Symptoms Outcomes: Biochemical Data, Chewing or Swallowing, Skin, Weight    Discharge Planning:    Continue current diet, Continue  Oral Nutrition Supplement     RD to f/u within 7 days.   Rosie Fate, RD  Contact: Thank you for allowing me to participate in the care of this patient.   Please contact your Registered Dietitian with any questions or concerns.     [facility]: [phone] Thelma Barge: [phone]  [place]: [phone]  [facility]: [phone]  Or message your clinical nutrition team via [facility]

## 2023-01-06 NOTE — Care Coordination-Inpatient (Signed)
01/05/22:  CM Note.  Spoke to pt at bedside, RE:  discharge plan with Dr Steward Ros office.  CM contacted [facility] ([facility]), per Dr Victory Dakin, the office will arrange f/u visit, plan, meds with pt.    AVS will indicate Dr Steward Ros d/c plan.  Pt and staff RN updated.      01/05/23:  BMP  CM Note.  Reviewed chart.  Spoke with hospitalist.  Hematology following pt for d/c from [facility].  CM remains available for identified d/c needs.   12/28/22 0844   Service Assessment   Patient Orientation Alert and Oriented   Cognition Alert   History Provided By Patient   Primary Caregiver Self   Support Systems Spouse/Significant Other   Patient's Healthcare Decision Maker is: Named in Scanned ACP Document   PCP Verified by CM Yes  (In [place])   Prior Functional Level Independent in ADLs/IADLs   Current Functional Level Independent in ADLs/IADLs   Can patient return to prior living arrangement Yes   Ability to make needs known: Good   Family able to assist with home care needs: Yes   Would you like for me to discuss the discharge plan with any other family members/significant others, and if so, who? Yes  (Spouse)   Chief Executive Officer;Other (Comment)   Community Resources None   Social/Functional History   Lives With Spouse   Type of Home House   Receives Help From Family   ADL Assistance Independent   Homemaking Assistance Independent   Ambulation Assistance Independent   Transfer Assistance Independent   Active Driver Yes   Mode of Engineer, water   Occupation Full time employment   Discharge Planning   Type of Residence House   Living Arrangements Spouse/Significant Other   Current Services Prior To Admission C-pap   Potential Assistance Needed N/A   DME Ordered? No   Potential Assistance Purchasing Medications No   Type of Home Care Services None   Patient expects to be discharged to: House   History of falls? 0   Services At/After Discharge   Transition of Care Consult (CM Consult) N/A   Services At/After Discharge  None   [employer] Information Provided? Yes   Mode of Transport at Discharge Self   Confirm Follow Up Transport Family   Condition of Participation: Discharge Planning   The Plan for Transition of Care is related to the following treatment goals: Home

## 2023-01-07 LAB — COMPREHENSIVE METABOLIC PANEL
ALT: 39 U/L (ref 0–50)
AST: 28 U/L (ref 0–50)
Albumin/Globulin Ratio: 1.1 (ref 1.00–2.70)
Albumin: 3.4 g/dL — ABNORMAL LOW (ref 3.5–5.2)
Alk Phosphatase: 47 U/L (ref 40–130)
Anion Gap: 11 mmol/L (ref 2–17)
BUN: 22 mg/dL (ref 8–23)
CALCIUM,CORRECTED,CCA: 9.4 mg/dL (ref 8.5–10.7)
CO2: 20 mmol/L — ABNORMAL LOW (ref 22–29)
Calcium: 8.9 mg/dL (ref 8.5–10.7)
Chloride: 104 mmol/L (ref 98–107)
Creatinine: 0.7 mg/dL (ref 0.7–1.3)
Est, Glom Filt Rate: 91 mL/min/1.73m (ref >=60)
Globulin: 3.2 g/dL (ref 1.9–4.4)
Glucose: 131 mg/dL — ABNORMAL HIGH (ref 70–99)
Osmolaliy Calculated: 275 mOsm/kg (ref 270–287)
Potassium: 4.4 mmol/L (ref 3.5–5.3)
Sodium: 135 mmol/L (ref 135–145)
Total Bilirubin: 0.6 mg/dL (ref 0.00–1.20)
Total Protein: 6.6 g/dL (ref 5.7–8.3)

## 2023-01-07 LAB — CBC WITH AUTO DIFFERENTIAL
Basophils %: 0.1 % (ref 0.0–2.0)
Basophils Absolute: 0 10*3/uL (ref 0.0–0.2)
Eosinophils %: 0.1 % (ref 0.0–7.0)
Eosinophils Absolute: 0 10*3/uL (ref 0.0–0.5)
Hematocrit: 39.7 % (ref 38.0–52.0)
Hemoglobin: 13 g/dL (ref 13.0–17.3)
Immature Grans (Abs): 0.12 10*3/uL — ABNORMAL HIGH (ref 0.00–0.06)
Immature Granulocytes %: 0.8 % — ABNORMAL HIGH (ref 0.0–0.6)
Lymphocytes Absolute: 0.7 10*3/uL — ABNORMAL LOW (ref 1.0–3.2)
Lymphocytes: 4.6 % — ABNORMAL LOW (ref 15.0–45.0)
MCH: 29.7 pg (ref 27.0–34.5)
MCHC: 32.7 g/dL (ref 32.0–36.0)
MCV: 90.8 fL (ref 84.0–100.0)
Monocytes %: 4.3 % (ref 4.0–12.0)
Monocytes Absolute: 0.7 10*3/uL (ref 0.3–1.0)
NRBC Absolute: 0 10*3/uL (ref 0.000–0.012)
NRBC Automated: 0 % (ref 0.0–0.2)
Neutrophils %: 90.1 % — ABNORMAL HIGH (ref 42.0–74.0)
Neutrophils Absolute: 14.2 10*3/uL — ABNORMAL HIGH (ref 1.6–7.3)
Platelets: 28 10*3/uL — ABNORMAL LOW (ref 140–440)
RBC: 4.37 x10e6/mcL (ref 4.00–5.60)
RDW: 13.5 % (ref 11.0–16.0)
WBC: 15.7 10*3/uL — ABNORMAL HIGH (ref 3.8–10.6)

## 2023-01-07 LAB — IMMATURE PLATELET FRACTION
Immature Platelet, Absolute: 6.8 10*3/uL
Immature Platelet, Percent: 24.3 % — ABNORMAL HIGH (ref 1.2–8.6)

## 2023-01-07 MED FILL — NORMAL SALINE FLUSH 0.9 % IV SOLN: 0.9 % | INTRAVENOUS | Qty: 10

## 2023-01-07 MED FILL — PROTONIX 40 MG IV SOLR: 40 MG | INTRAVENOUS | Qty: 40

## 2023-01-07 MED FILL — VITAMIN D3 25 MCG (1000 UT) PO TABS: 25 MCG (1000 UT) | ORAL | Qty: 1

## 2023-01-07 MED FILL — THERA PO TABS: ORAL | Qty: 1

## 2023-01-07 MED FILL — ROSUVASTATIN CALCIUM 10 MG PO TABS: 10 MG | ORAL | Qty: 1

## 2023-01-07 MED FILL — ATENOLOL 25 MG PO TABS: 25 MG | ORAL | Qty: 2

## 2023-01-07 MED FILL — NORMAL SALINE FLUSH 0.9 % IV SOLN: 0.9 % | INTRAVENOUS | Qty: 30

## 2023-01-07 NOTE — Discharge Summary (Signed)
[facility] Hospitalist Service  Discharge Summary      Patient Name:  Luis Wilcox    Patient Age:  82 y.o.    Patient DOB:  [birth year]    MRN:  [id number]    Admit date:  12/27/2022    Discharge date:   01/07/2023     Admitting Physician:  Cory Munch, MD     Admission Diagnoses:  Severe thrombocytopenia [facility]) [D69.6]    Discharge Physician:  Roxy Horseman, MD     Discharge Diagnoses:     Patient Active Problem List   Diagnosis    Immune thrombocytopenia (HCC)    Atrial fibrillation (HCC)    Hypertension    Hyperlipidemia        Consults:   IP CONSULT TO HEM/ONC  IP CONSULT TO PICC TEAM  IP CONSULT TO PICC TEAM   Significant Diagnostic Studies:  [place] abd  IMPRESSION:  1. Normal abdominal ultrasound.   Liver and spleen are unremarkable       Disposition:  home     Hospital Stay   HPI (from admission H&P)  Hospital Course:     Luis Wilcox is a 82 year old man who presented to the hospital with bruising and petechiae.  He noticed severe fatigue after swimming in a pool.  He has a past medical history for hypertension, paroxysmal atrial fibrillation with a prior history of TIA.  He is on Xarelto as an outpatient.  His wife noticed a rash on his legs, and significant bruising.  He presented with a platelet level of 2, and was quickly diagnosed with Immune thrombocytopenia.  He underwent multiple doses of dexamethasone 40 mg daily from May 6 to May 10.  He also was given Rituxan twice, and Nplate twice, and IVIG from May 6 through May 8.  He had a slow response to treatment, with his platelets dropping all the way to 1.  Today, his platelet count is 28,000.  He is feeling better, and is ready for discharge home.  He will stop his blood thinners upon discharge, and can restart it after labs are rechecked with Dr. Orpah Melter with hematology.  He will continue atenolol for his hypertension.  He had no significant bleeding episodes while here.  Overall, he is stable for discharge home in [place] today.  He often  travels to [place] for business as well, and he can follow-up with his PCP in a few weeks.        Microbiology:  No results found for: "ORG"    Recent Labs: CBC:   Lab Results   Component Value Date/Time    WBC 15.7 01/07/2023 05:40 AM    RBC 4.37 01/07/2023 05:40 AM    HGB 13.0 01/07/2023 05:40 AM    HCT 39.7 01/07/2023 05:40 AM    MCV 90.8 01/07/2023 05:40 AM    MCH 29.7 01/07/2023 05:40 AM    MCHC 32.7 01/07/2023 05:40 AM    RDW 13.5 01/07/2023 05:40 AM    PLT 28 01/07/2023 05:40 AM     BMP:    Lab Results   Component Value Date/Time    GLUCOSE 131 01/07/2023 05:40 AM    NA 135 01/07/2023 05:40 AM    K 4.4 01/07/2023 05:40 AM    CL 104 01/07/2023 05:40 AM    CO2 20 01/07/2023 05:40 AM    ANIONGAP 11 01/07/2023 05:40 AM    BUN 22 01/07/2023 05:40 AM    CREATININE 0.7 01/07/2023 05:40  AM    CALCIUM 8.9 01/07/2023 05:40 AM    LABGLOM 91 01/07/2023 05:40 AM     HFP:  No results found for: "PROT"  CMP:    Lab Results   Component Value Date/Time    GLUCOSE 131 01/07/2023 05:40 AM    NA 135 01/07/2023 05:40 AM    K 4.4 01/07/2023 05:40 AM    CL 104 01/07/2023 05:40 AM    CO2 20 01/07/2023 05:40 AM    BUN 22 01/07/2023 05:40 AM    CREATININE 0.7 01/07/2023 05:40 AM    ANIONGAP 11 01/07/2023 05:40 AM    ALKPHOS 47 01/07/2023 05:40 AM    ALT 39 01/07/2023 05:40 AM    AST 28 01/07/2023 05:40 AM    BILITOT 0.60 01/07/2023 05:40 AM    ALBUMIN 3.4 01/07/2023 05:40 AM    LABGLOM 91 01/07/2023 05:40 AM    CALCIUM 8.9 01/07/2023 05:40 AM     PT/INR:    Lab Results   Component Value Date/Time    PROTIME 14.9 12/29/2022 08:34 AM    INR 1.2 12/29/2022 08:34 AM       Radiology Last 7 Days:  No results found.    Discharge Exam:  Vitals:    01/06/23 2242 01/07/23 0326 01/07/23 0736 01/07/23 1146   BP: 111/86 (!) 136/95 109/77 127/76   Pulse: 84 79 78 70   Resp: 17 17 16 18    Temp: 97.9 F (36.6 C) 97.6 F (36.4 C) 97.6 F (36.4 C) 97.4 F (36.3 C)   TempSrc: Oral Oral Oral Oral   SpO2: 97% 97% 99% 98%   Weight:       Height:         Body mass index is 22.66 kg/m.  BP 127/76   Pulse 70   Temp 97.4 F (36.3 C) (Oral)   Resp 18   Ht 1.75 m (5' 8.9")   Wt 69.4 kg (153 lb)   SpO2 98%   BMI 22.66 kg/m   Patient is awake and alert.  He is pleasant and talkative.  He is walking freely in his room, nontoxic-appearing.  Heart is regular.  Work of breathing is normal.  Lungs seem clear.  Abdomen is soft and nontender.  He has some ecchymoses on his right elbow, stable.  He has petechiae on both forelegs, unchanged from prior examination.      Discharge Medications        Current Outpatient Medications   Medication Instructions    alendronate (FOSAMAX) 70 mg, Oral, EVERY 7 DAYS, Sunday     atenolol (TENORMIN) 50 mg, Oral, 2 TIMES DAILY    calcium carbonate (TUMS) 500 MG chewable tablet 1-2 tablets, Oral, NIGHTLY PRN    clobetasol (TEMOVATE) 0.05 % cream 1 each, Topical, PRN    fluticasone (FLONASE) 50 MCG/ACT nasal spray 1 spray, Each Nostril, NIGHTLY PRN    Multiple Vitamin (MULTIVITAMIN) TABS tablet 1 tablet, Oral, DAILY    Multiple Vitamins-Minerals (PRESERVISION AREDS 2) CAPS 1 capsule, Oral, 2 times daily    Omega-3 Fatty Acids (FISH OIL) 1000 MG capsule 1 capsule, Oral, DAILY    rosuvastatin (CRESTOR) 10 mg, Oral, EVERY EVENING    tadalafil (CIALIS) 5 MG tablet 1-4 tablets, Oral, DAILY PRN    Vitamin D (CHOLECALCIFEROL) 1,000 Units, Oral, DAILY        Discharge Plan   Provider Follow-Up:   Tedd Sias, MD  [address] [place] [address]  [phone]    Go in 5 day(s)       Follow-up with Heme, PCP in 1 week.    Hospital/Incidental Findings Requiring Follow-Up:  ITP    In process/preliminary results:  Outstanding Order Results       Date and Time Order Name Status Description    12/27/2022  5:26 PM ANTIBODY SCREEN In process     12/27/2022  5:26 PM ABO/RH In process     12/27/2022  5:26 PM ANTIBODY SCREEN In process     12/27/2022  5:26 PM ABO/RH In process     12/27/2022  5:26 PM TYPE AND SCREEN In process      12/27/2022  1:25 PM ANTIBODY SCREEN In process     12/27/2022  1:25 PM ABO/RH In process     12/27/2022  1:25 PM TYPE AND SCREEN In process             Patient Instructions      Discharge Activity:  activity as tolerated      Discharge Diet:  regular diet     Discharge Wound Care: none needed    Special instructions  none     Time Spent on Discharge:  >30 minutes were spent in patient examination, evaluation, counseling as well as medication reconciliation, prescriptions for required medications, discharge plan and follow up.        Electronically signed by Roxy Horseman, MD on 01/07/23 at 12:34 PM EDT

## 2023-01-07 NOTE — Plan of Care (Signed)
Problem: ABCDS Injury Assessment  Goal: Absence of physical injury  01/07/2023 1123 by Marva Panda, RN  Outcome: Progressing  01/07/2023 0642 by Carolan Clines, Virgilio Belling, RN  Outcome: Progressing     Problem: Pain  Goal: Verbalizes/displays adequate comfort level or baseline comfort level  01/07/2023 1123 by Marva Panda, RN  Outcome: Progressing  01/07/2023 0642 by Carolan Clines, Virgilio Belling, RN  Outcome: Progressing     Problem: Safety - Adult  Goal: Free from fall injury  01/07/2023 1123 by Marva Panda, RN  Outcome: Progressing  01/07/2023 0642 by Carolan Clines, Virgilio Belling, RN  Outcome: Progressing     Problem: Hematologic - Adult  Goal: Maintains hematologic stability  01/07/2023 1123 by Marva Panda, RN  Outcome: Progressing  01/07/2023 0642 by Carolan Clines, Virgilio Belling, RN  Outcome: Progressing

## 2023-01-12 ENCOUNTER — Inpatient Hospital Stay: Admit: 2023-01-12 | Discharge: 2023-01-12 | Payer: BLUE CROSS/BLUE SHIELD

## 2023-01-12 ENCOUNTER — Ambulatory Visit: Admit: 2023-01-12 | Discharge: 2023-01-12 | Payer: MEDICARE

## 2023-01-12 ENCOUNTER — Ambulatory Visit: Admit: 2023-01-12 | Discharge: 2023-01-12 | Payer: BLUE CROSS/BLUE SHIELD | Attending: Internal Medicine

## 2023-01-12 DIAGNOSIS — D693 Immune thrombocytopenic purpura: Secondary | ICD-10-CM

## 2023-01-12 LAB — COMPREHENSIVE METABOLIC PANEL
ALT: 28 U/L (ref 10–47)
AST: 24 U/L (ref 11–38)
Albumin: 3.3 g/dL (ref 3.3–5.5)
Alkaline Phosphatase: 56 IU/L (ref 53–128)
BUN: 20 mg/dL (ref 7–22)
CO2: 25 mmol/L (ref 18–33)
Calcium: 9.3 mg/dL (ref 8.0–10.3)
Chloride: 109 mmol/L — ABNORMAL HIGH (ref 98–108)
Creatinine: 0.8 mg/dL (ref 0.6–1.2)
Glucose: 110 mg/dL (ref 73–118)
Potassium: 4 mmol/L (ref 3.6–5.1)
Sodium: 142 mmol/L (ref 128–145)
Total Bilirubin: 0.9 mg/dL (ref 0.20–1.60)
Total Protein: 6.6 g/dL (ref 6.4–8.1)

## 2023-01-12 LAB — CBC WITH AUTO DIFFERENTIAL
Absolute Mid: 0.9 10*3/uL (ref 0.0–1.8)
Granulocyte Absolute Count: 6.3 10*3/uL (ref 2.0–7.8)
Granulocytes %: 72.7 % (ref 37.0–92.0)
Hematocrit: 40.4 % (ref 37.0–51.0)
Hemoglobin: 13 g/dL (ref 12.0–17.5)
Lymphocytes Absolute: 1.5 10*3/uL (ref 0.6–4.1)
Lymphocytes: 17 % (ref 10.0–58.5)
MCH: 30.4 pg (ref 26.0–32.0)
MCHC: 32.2 g/dL (ref 31.0–36.0)
MCV: 94.4 fL (ref 80.0–97.5)
MID %: 10.3 % (ref 0.1–24.0)
MPV: 8.5 fL (ref 0.0–49.9)
Platelets: 394 10*3/uL (ref 140–440)
RBC: 4.28 M/uL (ref 4.20–6.30)
RDW: 13.1 % (ref 11.5–14.5)
WBC: 8.6 10*3/uL (ref 4.1–10.9)

## 2023-01-12 MED ORDER — DIPHENHYDRAMINE HCL 50 MG/ML IJ SOLN
50 | Freq: Once | INTRAMUSCULAR | Status: AC
Start: 2023-01-12 — End: 2023-01-12
  Administered 2023-01-12: 15:00:00 25 mg via INTRAVENOUS

## 2023-01-12 MED ORDER — HEPARIN NA (PORK) LOCK FLSH PF 100 UNIT/ML IV SOLN
100 | INTRAVENOUS | Status: DC | PRN
Start: 2023-01-12 — End: 2023-01-13

## 2023-01-12 MED ORDER — METHYLPREDNISOLONE NA SUC (PF) 125 MG IJ SOLR
125 | Freq: Once | INTRAMUSCULAR | Status: AC
Start: 2023-01-12 — End: 2023-01-12
  Administered 2023-01-12: 15:00:00 125 mg via INTRAVENOUS

## 2023-01-12 MED ORDER — LIDOCAINE HCL (PF) 1 % IJ SOLN
1 | Freq: Once | INTRAMUSCULAR | Status: DC | PRN
Start: 2023-01-12 — End: 2023-01-13

## 2023-01-12 MED ORDER — ACETAMINOPHEN 325 MG PO TABS
325 | Freq: Once | ORAL | Status: AC
Start: 2023-01-12 — End: 2023-01-12
  Administered 2023-01-12: 15:00:00 650 mg via ORAL

## 2023-01-12 MED ORDER — STERILE WATER FOR INJECTION (MIXTURES ONLY)
2 | INTRAMUSCULAR | Status: DC | PRN
Start: 2023-01-12 — End: 2023-01-13

## 2023-01-12 MED ORDER — NORMAL SALINE FLUSH 0.9 % IV SOLN
0.9 | INTRAVENOUS | Status: DC | PRN
Start: 2023-01-12 — End: 2023-01-13
  Administered 2023-01-12: 15:00:00 20 mL via INTRAVENOUS

## 2023-01-12 MED ORDER — RITUXIMAB-PVVR 500 MG/50ML IV SOLN
500 | Freq: Once | INTRAVENOUS | Status: AC
Start: 2023-01-12 — End: 2023-01-12
  Administered 2023-01-12: 15:00:00 700 mg/m2 via INTRAVENOUS

## 2023-01-12 MED ORDER — SODIUM CHLORIDE 0.9 % IV SOLN
0.9 | INTRAVENOUS | Status: DC | PRN
Start: 2023-01-12 — End: 2023-01-13

## 2023-01-12 MED FILL — DIPHENHYDRAMINE HCL 50 MG/ML IJ SOLN: 50 MG/ML | INTRAMUSCULAR | Qty: 1

## 2023-01-12 MED FILL — ACETAMINOPHEN 325 MG PO TABS: 325 MG | ORAL | Qty: 2

## 2023-01-12 MED FILL — RUXIENCE 500 MG/50ML IV SOLN: 500 MG/50ML | INTRAVENOUS | Qty: 50

## 2023-01-12 MED FILL — SOLU-MEDROL (PF) 125 MG IJ SOLR: 125 MG | INTRAMUSCULAR | Qty: 125

## 2023-01-12 NOTE — Progress Notes (Signed)
Scripts verified  CBC, CMP-IH   Hold Nplate today   Platelet count improved to 394 today   Okay for C3 Rituximab today  Patient tolerated first 2 cycles well as inpatient. Okay to administer over 90 minutes   Okay to restart xarelto for afib   RTC 1 week with [facility]- C4 Rituximab; CBC, CMP-IH

## 2023-01-12 NOTE — Progress Notes (Signed)
[facility]  Follow-up Visit            Date:01/12/2023        Patient Name:Luis Wilcox       Date of Birth:[birth year]         Age: 82 y.o. male    DIAGNOSES:   Immune thrombocytopenic purpura (ITP).  Hepatitis panel, ANA with reflex panel, HIV, H. pylori testing negative.  Atrial fibrillation on Xarelto.    TREATMENT HISTORY:  No response with dexamethasone 40 mg daily x 4 days, started 12/27/2022.  No response with IVIG 400 mg/kg IV x 3 days.  Weekly rituximab 375 mg/m2 started 12/30/2022 until present, dose #3 today on 01/12/2023.  Nplate 1 mcg/kg given on 12/31/2022, 5 mcg/kg given on 01/05/2023.    Interval History: Luis Wilcox is a pleasant 82 year old male who presents for follow-up regarding ITP.  I met him during his recent hospitalization.  His platelet count was severely low on admission.  He has oral hemorrhagic blisters, petechiae.  Xarelto was held on admission, takes for atrial fibrillation.  Platelet count declined to 1000.  He was unresponsive to platelet transfusions.  He had some minor GI bleeding while inpatient.  He was empirically started on steroids as well as IVIG.  His platelet count continued to decline.  Ultimately he received rituximab on 12/30/2022.  Despite this his platelet count remained at 1000.  He then received Nplate 1 mcg/kg.  He received an additional dose the following week at 5 mcg/kg.  Platelet count increased to 24,000 prior to discharge.  He reports that he is doing well.  Overall petechiae continue to improve.  He denies significant bruising or bleeding.  He does notes that he has some fatigue still.    Past Medical History:  Past Medical History:   Diagnosis Date    Atrial fibrillation (HCC)     Hyperlipidemia     Hypertension         Past Surgical History:  No past surgical history on file.     Family History:  No family history on file.    Allergies:  Patient has no known allergies.    Medications:  Prior to Admission medications    Medication Sig Start  Date End Date Taking? Authorizing Provider   alendronate (FOSAMAX) 70 MG tablet Take 1 tablet by mouth every 7 days Sunday    [provider]   atenolol (TENORMIN) 50 MG tablet Take 1 tablet by mouth 2 times daily    [provider]   clobetasol (TEMOVATE) 0.05 % cream Apply 1 each topically as needed (skin flares)    [provider]   rosuvastatin (CRESTOR) 10 MG tablet Take 1 tablet by mouth every evening    [provider]   tadalafil (CIALIS) 5 MG tablet Take 1-4 tablets by mouth daily as needed for Erectile Dysfunction    [provider]   calcium carbonate (TUMS) 500 MG chewable tablet Take 1-2 tablets by mouth nightly as needed for Heartburn    [provider]   Multiple Vitamins-Minerals (PRESERVISION AREDS 2) CAPS Take 1 capsule by mouth in the morning and at bedtime    [provider]   Multiple Vitamin (MULTIVITAMIN) TABS tablet Take 1 tablet by mouth daily    [provider]   Vitamin D (CHOLECALCIFEROL) 25 MCG (1000 UT) TABS tablet Take 1 tablet by mouth daily    [provider]   Omega-3 Fatty Acids (FISH OIL) 1000 MG  capsule Take 1 capsule by mouth daily    [provider]   fluticasone (FLONASE) 50 MCG/ACT nasal spray 1 spray by Each Nostril route nightly as needed for Rhinitis or Allergies    [provider]        Review of Systems:  A 14 point ROS was performed. Pertinent positives included in HPI, otherwise ROS negative.    Physical Exam:  BP 126/83 (Site: Right Upper Arm, Position: Sitting, Cuff Size: Medium Adult)   Pulse 78   Temp 97.9 F (36.6 C) (Oral)   Resp 16   Ht 1.75 m (5' 8.9")   Wt 70.8 kg (156 lb)   SpO2 98%   BMI 23.10 kg/m     GEN: Appears well, in no apparent distress.  HEENT: EOMI. No scleral icterus. Normocephalic, atraumatic.  CV: Regular rate and rhythm. No murmurs rubs or gallops.  RESP: Breathing non-labored. Clear breath sounds bilaterally.  ABD: Soft, nontender,  nondistended. No rebound or guarding. No hepatomegaly. No splenomegaly.  MSK: Normal strength and range of motion. No tenderness or swelling.  SKIN: No rashes or lesions.  NEURO: Alert, oriented x3. CN II - XII grossly intact.  PSYCH: Appropriate mood, normal affect.  LYMPH: No palpable cervical, supraclavicular or axillary LAD.    Laboratory Data:  Recent Labs     01/12/23  0927   WBC 8.6   RBC 4.28   HGB 13.0   HCT 40.4   MCV 94.4   RDW 13.1   PLT 394     Lab Results   Component Value Date    LDH 173 12/29/2022      No results found for: "IRON", "TIBC", "FERRITIN"   Lab Results   Component Value Date    VITAMINB12 362 12/30/2022      ASSESSMENT/PLAN:  1.  ITP: Platelet count was refractory to steroids, IVIG initially during hospitalization.  He was started on rituximab and also Nplate.  Platelet count has improved up to 394,000 today.  He will receive third dose of rituximab today.  I will hold Nplate, package insert states holding greater than 400,000 which she is very close to.  I will have him return in 1 week for platelet count check.  If platelet count is dropping we can give Nplate likely at a lower dose of 3 mcg/kg.  Baseline testing including ANA, HIV, hepatitis panel and HIV pylori testing were negative.  We can consider a bone marrow aspiration biopsy if platelet count drops again and/or other cytopenias develop.    Return to clinic in 1 week.    Orpah Melter, MD

## 2023-01-12 NOTE — Progress Notes (Signed)
Retroactive note for tx date of 01/12/23:    Re: Infusion administered over 68m per indicated tolerance and follow up note from appt w. Dr Victory Dakin.

## 2023-01-14 ENCOUNTER — Inpatient Hospital Stay: Payer: MEDICARE

## 2023-01-19 ENCOUNTER — Ambulatory Visit: Admit: 2023-01-19 | Discharge: 2023-01-19 | Payer: BLUE CROSS/BLUE SHIELD

## 2023-01-19 ENCOUNTER — Ambulatory Visit: Admit: 2023-01-19 | Discharge: 2023-01-19 | Payer: BLUE CROSS/BLUE SHIELD | Attending: Internal Medicine

## 2023-01-19 DIAGNOSIS — D693 Immune thrombocytopenic purpura: Secondary | ICD-10-CM

## 2023-01-19 LAB — CBC WITH AUTO DIFFERENTIAL
Absolute Mid: 1 10*3/uL (ref 0.0–1.8)
Granulocyte Absolute Count: 5.7 10*3/uL (ref 2.0–7.8)
Granulocytes %: 68.5 % (ref 37.0–92.0)
Hematocrit: 39.5 % (ref 37.0–51.0)
Hemoglobin: 12.7 g/dL (ref 12.0–17.5)
Lymphocytes Absolute: 1.6 10*3/uL (ref 0.6–4.1)
Lymphocytes: 19.1 % (ref 10.0–58.5)
MCH: 30.8 pg (ref 26.0–32.0)
MCHC: 32.2 g/dL (ref 31.0–36.0)
MCV: 95.9 fL (ref 80.0–97.5)
MID %: 12.4 % (ref 0.1–24.0)
MPV: 7.5 fL (ref 0.0–49.9)
Platelets: 809 10*3/uL (ref 140–440)
RBC: 4.12 M/uL — ABNORMAL LOW (ref 4.20–6.30)
RDW: 11.5 % (ref 11.5–14.5)
WBC: 8.3 10*3/uL (ref 4.1–10.9)

## 2023-01-19 LAB — COMPREHENSIVE METABOLIC PANEL
ALT: 27 U/L (ref 10–47)
AST: 25 U/L (ref 11–38)
Albumin: 3.4 g/dL (ref 3.3–5.5)
Alkaline Phosphatase: 49 IU/L — ABNORMAL LOW (ref 53–128)
BUN: 17 mg/dL (ref 7–22)
CO2: 26 mmol/L (ref 18–33)
Calcium: 8.7 mg/dL (ref 8.0–10.3)
Chloride: 103 mmol/L (ref 98–108)
Creatinine: 0.8 mg/dL (ref 0.6–1.2)
Glucose: 109 mg/dL (ref 73–118)
Potassium: 4.8 mmol/L (ref 3.6–5.1)
Sodium: 133 mmol/L (ref 128–145)
Total Bilirubin: 0.9 mg/dL (ref 0.20–1.60)
Total Protein: 6.5 g/dL (ref 6.4–8.1)

## 2023-01-19 NOTE — Progress Notes (Signed)
[facility]  Follow-up Visit            Date:01/19/2023        Patient Name:Luis Wilcox       Date of Birth:Jan 28, 1941         Age: 82 y.o. male    DIAGNOSES:   Immune thrombocytopenic purpura (ITP).  Hepatitis panel, ANA with reflex panel, HIV, H. pylori testing negative.  Atrial fibrillation on Xarelto.    TREATMENT HISTORY:  No response with dexamethasone 40 mg daily x 4 days, started 12/27/2022.  No response with IVIG 400 mg/kg IV x 3 days.  Weekly rituximab 375 mg/m2 started 12/30/2022 until present, dose #3 on 01/12/2023.  Dose #4 help with thrombocytosis.  Nplate 1 mcg/kg given on 12/31/2022, 5 mcg/kg given on 01/05/2023.  Further therapy on hold with rising platelet count.    Interval History: Luis Wilcox is a pleasant 82 year old male who presents for follow-up regarding ITP.  He was last seen in clinic last week.  At that time platelet count was 394,000.  Further Nplate was held.  He received third dose of rituximab without difficulty.  He reports doing well.  He denies significant bruising or bleeding.  We reviewed labs from today, see below.    Past Medical History:  Past Medical History:   Diagnosis Date    Atrial fibrillation (HCC)     Hyperlipidemia     Hypertension      Past Surgical History:  No past surgical history on file.     Family History:  No family history on file.    Allergies:  Patient has no known allergies.    Medications:  Prior to Admission medications    Medication Sig Start Date End Date Taking? Authorizing Provider   alendronate (FOSAMAX) 70 MG tablet Take 1 tablet by mouth every 7 days Sunday    [provider]   atenolol (TENORMIN) 50 MG tablet Take 1 tablet by mouth 2 times daily    [provider]   clobetasol (TEMOVATE) 0.05 % cream Apply 1 each topically as needed (skin flares)    [provider]   rosuvastatin (CRESTOR) 10 MG tablet Take 1 tablet by mouth every evening    [provider]   tadalafil (CIALIS) 5 MG tablet  Take 1-4 tablets by mouth daily as needed for Erectile Dysfunction    [provider]   calcium carbonate (TUMS) 500 MG chewable tablet Take 1-2 tablets by mouth nightly as needed for Heartburn    [provider]   Multiple Vitamins-Minerals (PRESERVISION AREDS 2) CAPS Take 1 capsule by mouth in the morning and at bedtime    [provider]   Multiple Vitamin (MULTIVITAMIN) TABS tablet Take 1 tablet by mouth daily    [provider]   Vitamin D (CHOLECALCIFEROL) 25 MCG (1000 UT) TABS tablet Take 1 tablet by mouth daily    [provider]   Omega-3 Fatty Acids (FISH OIL) 1000 MG capsule Take 1 capsule by mouth daily    [provider]   fluticasone (FLONASE) 50 MCG/ACT nasal spray 1 spray by Each Nostril route nightly as needed for Rhinitis or Allergies    [provider]        Review of Systems:  A 14 point ROS was performed. Pertinent positives included in HPI, otherwise ROS negative.    Physical Exam:  BP 126/73 (Site: Right Upper Arm, Position: Sitting, Cuff Size: Medium Adult)   Pulse 85  Temp 97.4 F (36.3 C) (Oral)   Resp 16   Ht 1.753 m (5\' 9" )   Wt 72.1 kg (159 lb)   SpO2 100%   BMI 23.48 kg/m     GEN: Appears well, in no apparent distress.  HEENT: EOMI. No scleral icterus. Normocephalic, atraumatic.  CV: Regular rate and rhythm. No murmurs rubs or gallops.  RESP: Breathing non-labored. Clear breath sounds bilaterally.  ABD: Soft, nontender, nondistended. No rebound or guarding. No hepatomegaly. No splenomegaly.  MSK: Normal strength and range of motion. No tenderness or swelling.  SKIN: No rashes or lesions.  NEURO: Alert, oriented x3. CN II - XII grossly intact.  PSYCH: Appropriate mood, normal affect.  LYMPH: No palpable cervical, supraclavicular or axillary LAD.    Laboratory Data:  Lab Results   Component Value Date    WBC 8.3 01/19/2023    HGB 12.7 01/19/2023    HCT 39.5 01/19/2023    MCV 95.9 01/19/2023    PLT 809 (HH)  01/19/2023     Lab Results   Component Value Date    LDH 173 12/29/2022      No results found for: "IRON", "TIBC", "FERRITIN"   Lab Results   Component Value Date    VITAMINB12 362 12/30/2022      ASSESSMENT/PLAN:  1.  ITP: Platelet count was refractory to steroids, IVIG initially during hospitalization.  He was started on rituximab and also Nplate.  Platelet count increased now to 809,000.  We discussed holding further Nplate (last given on 01/05/2023) and also limiting fourth dose of rituximab for now.  He is clinically doing well.  Plan to have him return in 1 week for recheck platelet count.  Consider decreasing frequency of visits depending on labs.    Return to clinic in 1 week.    Orpah Melter, MD

## 2023-01-19 NOTE — Progress Notes (Signed)
1610: Call from [facility] [place] office with Critical lab result    Critical Platelet Count: 809    Results read back and acknowledged.    9604: Results verbally given to Dr Victory Dakin, states she will discuss during the office visit this morning.

## 2023-01-19 NOTE — Progress Notes (Signed)
Scripts verified  CBC, CMP-IH   Hold Rituximab and Nplate today   Platelets 809  Discussed plan to omit final rituximab dose  Continue xarelto for afib   RTC 1 week with [facility]L- CBC; Nplate at [facility] pending CBC

## 2023-01-20 ENCOUNTER — Inpatient Hospital Stay: Payer: MEDICARE

## 2023-01-26 ENCOUNTER — Ambulatory Visit: Admit: 2023-01-26 | Discharge: 2023-01-26 | Payer: MEDICARE | Attending: Internal Medicine

## 2023-01-26 ENCOUNTER — Ambulatory Visit: Admit: 2023-01-26 | Discharge: 2023-01-26 | Payer: BLUE CROSS/BLUE SHIELD

## 2023-01-26 ENCOUNTER — Inpatient Hospital Stay: Payer: MEDICARE

## 2023-01-26 VITALS — BP 123/74 | HR 77 | Temp 98.90000°F | Resp 16 | Ht 69.0 in | Wt 154.0 lb

## 2023-01-26 DIAGNOSIS — D693 Immune thrombocytopenic purpura: Secondary | ICD-10-CM

## 2023-01-26 LAB — CBC WITH AUTO DIFFERENTIAL
Absolute Mid: 1.2 10*3/uL (ref 0.0–1.8)
Granulocyte Absolute Count: 5.3 10*3/uL (ref 2.0–7.8)
Granulocytes %: 67.8 % (ref 37.0–92.0)
Hematocrit: 39.8 % (ref 37.0–51.0)
Hemoglobin: 12.8 g/dL (ref 12.0–17.5)
Lymphocytes Absolute: 1.3 10*3/uL (ref 0.6–4.1)
Lymphocytes: 16.6 % (ref 10.0–58.5)
MCH: 30 pg (ref 26.0–32.0)
MCHC: 32.2 g/dL (ref 31.0–36.0)
MCV: 93.4 fL (ref 80.0–97.5)
MID %: 15.6 % (ref 0.1–24.0)
MPV: 7.6 fL (ref 0.0–49.9)
Platelets: 423 10*3/uL (ref 140–440)
RBC: 4.26 M/uL (ref 4.20–6.30)
RDW: 12.3 % (ref 11.5–14.5)
WBC: 7.8 10*3/uL (ref 4.1–10.9)

## 2023-01-26 LAB — COMPREHENSIVE METABOLIC PANEL
ALT: 24 U/L (ref 10–47)
AST: 27 U/L (ref 11–38)
Albumin: 3.4 g/dL (ref 3.3–5.5)
Alkaline Phosphatase: 60 IU/L (ref 53–128)
BUN: 15 mg/dL (ref 7–22)
CO2: 25 mmol/L (ref 18–33)
Calcium: 9.1 mg/dL (ref 8.0–10.3)
Chloride: 103 mmol/L (ref 98–108)
Creatinine: 0.9 mg/dL (ref 0.6–1.2)
Glucose: 107 mg/dL (ref 73–118)
Potassium: 4.4 mmol/L (ref 3.6–5.1)
Sodium: 137 mmol/L (ref 128–145)
Total Bilirubin: 1.2 mg/dL (ref 0.20–1.60)
Total Protein: 6.8 g/dL (ref 6.4–8.1)

## 2023-01-26 NOTE — Progress Notes (Unsigned)
[facility]  Follow-up Visit            Date:01/26/2023        Patient Name:Luis Wilcox       Date of Birth:09-05-1940         Age: 82 y.o. male    DIAGNOSES:   Immune thrombocytopenic purpura (ITP).  Hepatitis panel, ANA with reflex panel, HIV, H. pylori testing negative.  Atrial fibrillation on Xarelto.    TREATMENT HISTORY:  No response with dexamethasone 40 mg daily x 4 days, started 12/27/2022.  No response with IVIG 400 mg/kg IV x 3 days.  Weekly rituximab 375 mg/m2 started 12/30/2022 until present, dose #3 on 01/12/2023.  Dose #4 help with thrombocytosis.  Nplate 1 mcg/kg given on 12/31/2022, 5 mcg/kg given on 01/05/2023.  Further therapy on hold with rising platelet count.    Interval History: Luis Wilcox is a pleasant 82 year old male who presents for follow-up regarding ITP.  He was last seen in clinic last week.  At that time platelet count was 394,000.  Further Nplate was held.  He received third dose of rituximab without difficulty.  He reports doing well.  He denies significant bruising or bleeding.  We reviewed labs from today, see below.    Past Medical History:  Past Medical History:   Diagnosis Date    Atrial fibrillation (HCC)     Hyperlipidemia     Hypertension      Past Surgical History:  No past surgical history on file.     Family History:  No family history on file.    Allergies:  Patient has no known allergies.    Medications:  Prior to Admission medications    Medication Sig Start Date End Date Taking? Authorizing Provider   alendronate (FOSAMAX) 70 MG tablet Take 1 tablet by mouth every 7 days Sunday    [provider]   atenolol (TENORMIN) 50 MG tablet Take 1 tablet by mouth 2 times daily    [provider]   clobetasol (TEMOVATE) 0.05 % cream Apply 1 each topically as needed (skin flares)    [provider]   rosuvastatin (CRESTOR) 10 MG tablet Take 1 tablet by mouth every evening    [provider]   tadalafil (CIALIS) 5 MG tablet  Take 1-4 tablets by mouth daily as needed for Erectile Dysfunction    [provider]   calcium carbonate (TUMS) 500 MG chewable tablet Take 1-2 tablets by mouth nightly as needed for Heartburn    [provider]   Multiple Vitamins-Minerals (PRESERVISION AREDS 2) CAPS Take 1 capsule by mouth in the morning and at bedtime    [provider]   Multiple Vitamin (MULTIVITAMIN) TABS tablet Take 1 tablet by mouth daily    [provider]   Vitamin D (CHOLECALCIFEROL) 25 MCG (1000 UT) TABS tablet Take 1 tablet by mouth daily    [provider]   Omega-3 Fatty Acids (FISH OIL) 1000 MG capsule Take 1 capsule by mouth daily    [provider]   fluticasone (FLONASE) 50 MCG/ACT nasal spray 1 spray by Each Nostril route nightly as needed for Rhinitis or Allergies    [provider]        Review of Systems:  A 14 point ROS was performed. Pertinent positives included in HPI, otherwise ROS negative.    Physical Exam:  BP 123/74 (Site: Right Upper Arm, Position: Sitting, Cuff Size: Medium Adult)   Pulse 77  Temp 98.9 F (37.2 C) (Oral)   Resp 16   Ht 1.753 m (5\' 9" )   Wt 69.9 kg (154 lb)   SpO2 99%   BMI 22.74 kg/m     GEN: Appears well, in no apparent distress.  HEENT: EOMI. No scleral icterus. Normocephalic, atraumatic.  CV: Regular rate and rhythm. No murmurs rubs or gallops.  RESP: Breathing non-labored. Clear breath sounds bilaterally.  ABD: Soft, nontender, nondistended. No rebound or guarding. No hepatomegaly. No splenomegaly.  MSK: Normal strength and range of motion. No tenderness or swelling.  SKIN: No rashes or lesions.  NEURO: Alert, oriented x3. CN II - XII grossly intact.  PSYCH: Appropriate mood, normal affect.  LYMPH: No palpable cervical, supraclavicular or axillary LAD.    Laboratory Data:  Lab Results   Component Value Date    WBC 7.8 01/26/2023    HGB 12.8 01/26/2023    HCT 39.8 01/26/2023    MCV 93.4 01/26/2023    PLT 423 01/26/2023      Lab Results   Component Value Date    LDH 173 12/29/2022      No results found for: "IRON", "TIBC", "FERRITIN"   Lab Results   Component Value Date    VITAMINB12 362 12/30/2022      ASSESSMENT/PLAN:  1.  ITP: Platelet count was refractory to steroids, IVIG initially during hospitalization.  He was started on rituximab and also Nplate.  Platelet count increased now to 809,000.  We discussed holding further Nplate (last given on 01/05/2023) and also limiting fourth dose of rituximab for now.  He is clinically doing well.  Plan to have him return in 1 week for recheck platelet count.  Consider decreasing frequency of visits depending on labs.    Return to clinic in 1 week.    Orpah Melter, MD

## 2023-02-02 ENCOUNTER — Ambulatory Visit: Admit: 2023-02-02 | Discharge: 2023-02-02 | Payer: MEDICARE

## 2023-02-02 ENCOUNTER — Ambulatory Visit: Payer: BLUE CROSS/BLUE SHIELD

## 2023-02-02 DIAGNOSIS — D693 Immune thrombocytopenic purpura: Secondary | ICD-10-CM

## 2023-02-02 LAB — CBC WITH AUTO DIFFERENTIAL
Absolute Mid: 1.1 10*3/uL (ref 0.0–1.8)
Granulocyte Absolute Count: 5.6 10*3/uL (ref 2.0–7.8)
Granulocytes %: 67.8 % (ref 37.0–92.0)
Hematocrit: 38.5 % (ref 37.0–51.0)
Hemoglobin: 12.6 g/dL (ref 12.0–17.5)
Lymphocytes Absolute: 1.6 10*3/uL (ref 0.6–4.1)
Lymphocytes: 19.5 % (ref 10.0–58.5)
MCH: 30.1 pg (ref 26.0–32.0)
MCHC: 32.7 g/dL (ref 31.0–36.0)
MCV: 92.2 fL (ref 80.0–97.5)
MID %: 12.7 % (ref 0.1–24.0)
MPV: 7.9 fL (ref 0.0–49.9)
Platelets: 345 10*3/uL (ref 140–440)
RBC: 4.18 M/uL — ABNORMAL LOW (ref 4.20–6.30)
RDW: 12.8 % (ref 11.5–14.5)
WBC: 8.3 10*3/uL (ref 4.1–10.9)

## 2023-02-03 NOTE — Other (Signed)
Please call patient that platelet count still looks excellent, 345,000. Please have him get a CBC done at [facility] in 2 weeks when he is [place]. He can call us when he completes this so I can look up the results. He already has the lab order.

## 2023-02-28 NOTE — Telephone Encounter (Signed)
Pt called and wanted to know if we received the results from [facility]. He had the labs drawn on 6/26 and has an upcoming appt on 7/10.

## 2023-03-01 NOTE — Telephone Encounter (Signed)
Contacted patient and told him that [facility] results from 6/26 were placed in his chart; verbalized understanding

## 2023-03-02 ENCOUNTER — Ambulatory Visit: Payer: BLUE CROSS/BLUE SHIELD | Attending: Internal Medicine

## 2023-03-02 ENCOUNTER — Other Ambulatory Visit: Admit: 2023-03-02 | Discharge: 2023-03-02 | Payer: MEDICARE

## 2023-03-02 DIAGNOSIS — D693 Immune thrombocytopenic purpura: Principal | ICD-10-CM

## 2023-03-02 LAB — COMPREHENSIVE METABOLIC PANEL
ALT: 15 U/L (ref 10–47)
AST: 25 U/L (ref 11–38)
Albumin: 3.5 g/dL (ref 3.3–5.5)
Alkaline Phosphatase: 53 IU/L (ref 53–128)
BUN: 14 mg/dL (ref 7–22)
CO2: 28 mmol/L (ref 18–33)
Calcium: 9.6 mg/dL (ref 8.0–10.3)
Chloride: 107 mmol/L (ref 98–108)
Creatinine: 0.9 mg/dL (ref 0.6–1.2)
Glucose: 98 mg/dL (ref 73–118)
Potassium: 3.9 mmol/L (ref 3.6–5.1)
Sodium: 142 mmol/L (ref 128–145)
Total Bilirubin: 1 mg/dL (ref 0.20–1.60)
Total Protein: 6.7 g/dL (ref 6.4–8.1)

## 2023-03-02 LAB — CBC WITH AUTO DIFFERENTIAL
Absolute Mid: 1 10*3/uL (ref 0.0–1.8)
Granulocyte Absolute Count: 4.2 10*3/uL (ref 2.0–7.8)
Granulocytes %: 61.5 % (ref 37.0–92.0)
Hematocrit: 43 % (ref 37.0–51.0)
Hemoglobin: 14 g/dL (ref 12.0–17.5)
Lymphocytes Absolute: 1.7 10*3/uL (ref 0.6–4.1)
Lymphocytes: 24.3 % (ref 10.0–58.5)
MCH: 29.9 pg (ref 26.0–32.0)
MCHC: 32.6 g/dL (ref 31.0–36.0)
MCV: 91.8 fL (ref 80.0–97.5)
MID %: 14.2 % (ref 0.1–24.0)
MPV: 8.5 fL (ref 0.0–49.9)
Platelets: 226 10*3/uL (ref 140–440)
RBC: 4.68 M/uL (ref 4.20–6.30)
RDW: 12.6 % (ref 11.5–14.5)
WBC: 6.9 10*3/uL (ref 4.1–10.9)

## 2023-03-02 NOTE — Other (Signed)
Please call Mr. Nobel that platelets remain excellent at 226. Will continue with observation. I will plan to see him with a recheck CBC in 6 weeks.

## 2023-03-02 NOTE — Other (Signed)
Phone call to pt, reviewed platelet count and that Dr Victory Dakin will see him back in 6 weeks with labs. Front office will call to schedule him. Pt verbalized understanding.

## 2023-03-08 ENCOUNTER — Encounter: Attending: Internal Medicine

## 2023-04-14 ENCOUNTER — Other Ambulatory Visit: Admit: 2023-04-14 | Discharge: 2023-04-14 | Payer: BLUE CROSS/BLUE SHIELD

## 2023-04-14 DIAGNOSIS — D693 Immune thrombocytopenic purpura: Secondary | ICD-10-CM

## 2023-04-14 LAB — CBC WITH AUTO DIFFERENTIAL
Absolute Mid: 1 10*3/uL (ref 0.0–1.8)
Granulocyte Absolute Count: 2.9 10*3/uL (ref 2.0–7.8)
Granulocytes %: 56.4 % (ref 37.0–92.0)
Hematocrit: 45.9 % (ref 37.0–51.0)
Hemoglobin: 15 g/dL (ref 12.0–17.5)
Lymphocytes Absolute: 1.3 10*3/uL (ref 0.6–4.1)
Lymphocytes: 24.6 % (ref 10.0–58.5)
MCH: 29.8 pg (ref 26.0–32.0)
MCHC: 32.7 g/dL (ref 31.0–36.0)
MCV: 91.3 fL (ref 80.0–97.5)
MID %: 19 % (ref 0.1–24.0)
MPV: 8.7 fL (ref 0.0–49.9)
Platelets: 186 10*3/uL (ref 140–440)
RBC: 5.03 M/uL (ref 4.20–6.30)
RDW: 13 % (ref 11.5–14.5)
WBC: 5.1 10*3/uL (ref 4.1–10.9)

## 2023-05-02 ENCOUNTER — Telehealth

## 2023-05-02 NOTE — Telephone Encounter (Signed)
 Spoke with patient. Advised that Dr. Carlo would like for him to repeat his labs in 1-2 weeks. Reports he is currently in [place]. Will fax labs to [facility] in [place], [place]. Advised patient to call office after he has labs completed. Advised that Dr. Carlo would like to see him and repeat labs in about 8 weeks. Patient will call office to schedule follow up.

## 2023-05-02 NOTE — Telephone Encounter (Signed)
 Patient said he is concerned about his most recent platelet lab results and wants to know if Dr. Victory Dakin wants him to redo his labs. Pls call pt back.

## 2023-05-06 NOTE — Telephone Encounter (Signed)
Pt is getting his labs drawn now at [facility] in [place], [place]. They do not have the orders. Can we fax these orders to them asap? Can we call the pt as soon as it has been completed?n  F:[phone]

## 2023-05-12 ENCOUNTER — Telehealth

## 2023-05-12 NOTE — Telephone Encounter (Signed)
Pt would like to get a call regarding his latest labs. I informed him that the labs from [facility] are not in our system yet, but once we receive them and review them, we will give him a call.

## 2023-05-12 NOTE — Telephone Encounter (Signed)
Spoke with patient. Reviewed lab results. Scheduled for OV with Dr. Victory Dakin in 4 weeks with repeat CBC

## 2023-05-30 ENCOUNTER — Ambulatory Visit: Payer: BC Managed Care – PPO | Admitting: Neurology

## 2023-06-08 ENCOUNTER — Encounter: Payer: BLUE CROSS/BLUE SHIELD | Attending: Internal Medicine

## 2023-07-29 ENCOUNTER — Other Ambulatory Visit: Payer: Self-pay | Admitting: Internal Medicine

## 2023-09-27 ENCOUNTER — Other Ambulatory Visit: Payer: Self-pay | Admitting: Internal Medicine

## 2023-10-29 ENCOUNTER — Other Ambulatory Visit: Payer: Self-pay | Admitting: Internal Medicine

## 2023-11-18 ENCOUNTER — Other Ambulatory Visit: Payer: Self-pay | Admitting: Internal Medicine

## 2023-11-28 ENCOUNTER — Other Ambulatory Visit: Payer: Self-pay | Admitting: Internal Medicine

## 2023-11-29 ENCOUNTER — Telehealth: Payer: Self-pay | Admitting: Internal Medicine

## 2023-11-29 NOTE — Telephone Encounter (Signed)
 Pt's medication was sent to pt's pharmacy as requested. Confirmation received.

## 2023-11-29 NOTE — Telephone Encounter (Signed)
*  STAT* If patient is at the pharmacy, call can be transferred to refill team.   1. Which medications need to be refilled? (please list name of each medication and dose if known)   atenolol (TENORMIN) 50 MG tablet    2. Which pharmacy/location (including street and city if local pharmacy) is medication to be sent to?  [id number] - [place], [place] - [address]      3. Do they need a 30 day or 90 day supply? 90 day    Pt is out of medication

## 2023-12-07 ENCOUNTER — Telehealth: Payer: Self-pay | Admitting: Neurology

## 2023-12-07 NOTE — Telephone Encounter (Signed)
 r/s appointment due to a conflict, pt is on wait list

## 2023-12-08 ENCOUNTER — Ambulatory Visit: Payer: BC Managed Care – PPO | Admitting: Neurology

## 2023-12-12 ENCOUNTER — Other Ambulatory Visit: Payer: Self-pay | Admitting: Internal Medicine

## 2023-12-14 ENCOUNTER — Telehealth: Payer: Self-pay | Admitting: Internal Medicine

## 2023-12-14 MED ORDER — ATENOLOL 50 MG PO TABS: 50.0000 mg | ORAL_TABLET | Freq: Two times a day (BID) | ORAL | 0 refills | Status: DC

## 2023-12-14 NOTE — Telephone Encounter (Signed)
*  STAT* If patient is at the pharmacy, call can be transferred to refill team.   1. Which medications need to be refilled? (please list name of each medication and dose if known)  atenolol  (TENORMIN ) 50 MG tablet    2. Would you like to learn more about the convenience, safety, & potential cost savings by using the [facility]?    3. Are you open to using the [facility] (Type [facility]. ).   4. Which pharmacy/location (including street and city if local pharmacy) is medication to be sent to? [id number] - [place], [place] - [address]    5. Do they need a 30 day or 90 day supply? 90 day   Upcoming appt: 01/20/24

## 2023-12-14 NOTE — Telephone Encounter (Signed)
 RX sent to requested Pharmacy

## 2024-01-09 ENCOUNTER — Ambulatory Visit (INDEPENDENT_AMBULATORY_CARE_PROVIDER_SITE_OTHER)

## 2024-01-09 ENCOUNTER — Ambulatory Visit (INDEPENDENT_AMBULATORY_CARE_PROVIDER_SITE_OTHER): Admitting: Podiatry

## 2024-01-09 DIAGNOSIS — M2041 Other hammer toe(s) (acquired), right foot: Secondary | ICD-10-CM

## 2024-01-09 DIAGNOSIS — M7751 Other enthesopathy of right foot: Secondary | ICD-10-CM | POA: Diagnosis not present

## 2024-01-09 NOTE — Patient Instructions (Signed)
 For topical you can use Aspercreme with lidocaine or topical lidocaine as needed

## 2024-01-11 NOTE — Progress Notes (Signed)
  Subjective:  Patient ID: Juan Shaffer, male    DOB: 02/29/1941,  MRN: [id number]  Chief Complaint  Patient presents with   Foot Pain    Patient is here swelling and pain in right index toe nail,   Nail Problem    Discussed the use of AI scribe software for clinical note transcription with the patient, who gave verbal consent to proceed.  History of Present Illness Juan Shaffer is an 83 year old male who presents with right toe pain and swelling.  He experiences soreness in the right second toe joint during daily activities, worsening with prolonged walking on concrete surfaces. The toe swells and becomes sore after such activities. He denies any injury to the toe and has not sought prior treatment. A spacer is used between his toes to manage discomfort, as the second toe curves left when standing. The toe becomes swollen and red after extensive walking, such as during trips involving significant walking. He engages in swimming and light weight work for exercise. The toe appears almost normal today but can swell and become red after extensive walking.  He is currently on Eliquis . He has a history of severe thrombocytopenia requiring hospitalization. His father and younger sister had rheumatoid arthritis. No pain in the second toe itself, but soreness is present in the joint area. Pressing on the joint causes noticeable but not severe pain. No pain higher up in the foot. He engages in activities such as riding a bike and playing golf.      Objective:    Physical Exam General: AAO x3, NAD  Dermatological: Skin is warm, dry and supple bilateral. There are no open sores, no preulcerative lesions, no rash or signs of infection present.  Vascular: Dorsalis Pedis artery and Posterior Tibial artery pedal pulses are 2/4 bilateral with immedate capillary fill time. There is no pain with calf compression, swelling, warmth, erythema.   Neruologic: Grossly intact via light touch bilateral.    Musculoskeletal: Tenderness along the right second MTPJ.  Some slight edema to the associated joint without any significant erythema or warmth today.  There is no area pinpoint tenderness.  Upon weightbearing valuation there is deviation of the second digit.  In the transverse plane.  Gait: Unassisted, Nonantalgic.     No images are attached to the encounter.    Results   RADIOLOGY Foot X-ray: Arthritis in the first metatarsophalangeal joint, joint space narrowing, osteophytes on the first metatarsophalangeal joint, medial deviation of the second digit at the level of the MTPJ.  There is no evidence of acute fracture.   Assessment:   1. Capsulitis of metatarsophalangeal (MTP) joint of right foot   2. Hammertoe of right foot      Plan:  Patient was evaluated and treated and all questions answered.  Assessment and Plan Assessment & Plan  Metatarsophalangeal joint inflammation Inflammation of the second metatarsophalangeal joint due to altered gait and pressure from a longer second metatarsal. Non-surgical options preferred per patient. - Refer to pedorthist for custom orthotic inserts to offload the second MTPJ. - Consider steroid injection if inflammation becomes severe.  For today. - Ice to manage swelling.  Digital deformity of second toe Hammer toe deformity due to longer second metatarsal and altered gait, causing joint inflammation and pain. Surgical correction not preferred. - Refer to pedorthist for custom orthotic inserts. - Discuss surgical options if conservative measures fail.   Return for inserts with Kerney Pee .   Charity Conch DPM

## 2024-01-19 NOTE — Progress Notes (Unsigned)
 Cardiology Office Note:  .   Date:  01/19/2024  ID:  Juan Shaffer, DOB October 17, 1940, MRN [id number] PCP: Lavenia Post., MD  [facility] Providers Cardiologist:  None Electrophysiologist:  Manya Sells, MD  Sleep Medicine:  Gaylyn Keas, MD {  History of Present Illness: Juan Shaffer   Juan Shaffer is a 83 y.o. 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 Carolynne Citron 05/1022, planned for monitor to assess rate control better Discussed pending f/u with neurology to discussed +/- need to change OAC given stroke occurred on Eliquis   He did see neurology with plans to w/u further and then discuss perhaps change to xarelto  CTs done, I dont find neuro f/u after imaging  Monitor was 100% AFib avg rate 82, early morning 3 second pause without significant bradycardia, no med changes recommended by Dr.Taylor  No EP f/u since 2023  Today's visit is scheduled as a "follow up" ROS:   He is doing well, feels well Remains active, still working They are living primarily in [facility] though current.y back here while renovations are happening ath theier home in [facility] and continues to come back/forth for work as well  Last year ran into issue with thrombocytopenia > this has been monitored and managed with an oncologist in [facility] Last Plts 170  No CP, palpitations or cardiac awareness No SOB No near syncope or syncope He bruises easily with bumps/trauma, but no bleeding or signs of bleeding otherwise  Arrhythmia/AAD hx AFib >> pt preference for rate control stratagey  Studies Reviewed: Juan Shaffer    EKG done today and reviewed by myself:  AFib 70bpm, LAD  Oct 2023: monitor Atrial fib with a slow VR, controlled VR, and RVR Pause of up to 3 seconds occurred in early morning. Brief periods of daytime bradycardia are present but not worrisome.  Rare PVC's. 5. No VT.   09/22/20: TTE IMPRESSIONS   1. Patient  appears to be in flutter during exam.   2. Left ventricular ejection fraction, by estimation, is 60 to 65%. The  left ventricle has normal function. The left ventricle has no regional  wall motion abnormalities. There is mild left ventricular hypertrophy.  Left ventricular diastolic parameters  are indeterminate.   3. Right ventricular systolic function is normal. The right ventricular  size is normal.   4. Right atrial size was mildly dilated.   5. The mitral valve is normal in structure. Mild mitral valve  regurgitation. No evidence of mitral stenosis.   6. The aortic valve is tricuspid. There is moderate calcification of the  aortic valve. There is moderate thickening of the aortic valve. Aortic  valve regurgitation is not visualized. Mild to moderate aortic valve  sclerosis/calcification is present,  without any evidence of aortic stenosis.   7. The inferior vena cava is normal in size with greater than 50%  respiratory variability, suggesting right atrial pressure of 3 mmHg.      07/15/15; TTE Study Conclusions  - Left ventricle: The cavity size was normal. There was moderate    focal basal hypertrophy of the septum. Systolic function was    normal. The estimated ejection fraction was in the range of 55%    to 60%. Wall motion was normal; there were no regional wall    motion abnormalities. Doppler parameters are consistent with    abnormal left ventricular relaxation (grade 1 diastolic  dysfunction). The E/e&' ratio is between 8-15, suggesting    indeterminate LV filling pressure.  - Aortic valve: Trileaflet. Sclerosis without stenosis. There was    trivial regurgitation.  - Mitral valve: Mildly thickened leaflets . There was mild    regurgitation.  - Left atrium: The atrium was normal in size.  - Right atrium: The atrium was normal in size.  - Tricuspid valve: There was mild regurgitation.  - Pulmonary arteries: PA peak pressure: 21 mm Hg (S).  - Inferior vena cava: The  vessel was normal in size. The    respirophasic diameter changes were in the normal range (>= 50%),    consistent with normal central venous pressure.      05/05/2015: EPS/ablation CONCLUSIONS:  1. Isthmus-dependent right atrial flutter upon presentation.  2. Successful radiofrequency ablation of atrial flutter along the cavotricuspid isthmus with complete bidirectional isthmus block achieved.  3. No inducible arrhythmias following ablation.  4. No early apparent complications.    Risk Assessment/Calculations:    Physical Exam:   VS:  There were no vitals taken for this visit.   Wt Readings from Last 3 Encounters:  06/02/22 155 lb (70.3 kg)  06/01/22 156 lb (70.8 kg)  04/30/22 157 lb (71.2 kg)    GEN: Well nourished, well developed in no acute distress NECK: No JVD; No carotid bruits CARDIAC: irreg-irreg, no murmurs, rubs, gallops RESPIRATORY:  CTA b/l without rales, wheezing or rhonchi  ABDOMEN: Soft, non-tender, non-distended EXTREMITIES: No edema; No deformity   ASSESSMENT AND PLAN: .    Longstanding persistent >> permanent AFib CHA2DS2Vasc is 5, on Eliquis , appropriately dosed (weight/Creat) Rate controlled  12/22/23 (care everywhere) Creat  0.98 H/H 14/43  Advised he get associated with local cardiologist in [facility] though we remain available to him Refilled atenolol   Secondary hypercoagulable state 2/2 AFib    Dispo: back in a year, sooner if needed  Signed, Debbie Fails, PA-C

## 2024-01-20 ENCOUNTER — Encounter: Payer: Self-pay | Admitting: Physician Assistant

## 2024-01-20 ENCOUNTER — Ambulatory Visit: Attending: Physician Assistant | Admitting: Physician Assistant

## 2024-01-20 VITALS — BP 112/74 | HR 70 | Ht 69.0 in | Wt 159.6 lb

## 2024-01-20 DIAGNOSIS — I4821 Permanent atrial fibrillation: Secondary | ICD-10-CM

## 2024-01-20 DIAGNOSIS — D6869 Other thrombophilia: Secondary | ICD-10-CM | POA: Diagnosis not present

## 2024-01-20 MED ORDER — ATENOLOL 50 MG PO TABS: 50.0000 mg | ORAL_TABLET | Freq: Two times a day (BID) | ORAL | 3 refills | Status: AC

## 2024-01-20 MED ORDER — APIXABAN 5 MG PO TABS: 5.0000 mg | ORAL_TABLET | Freq: Two times a day (BID) | ORAL | 3 refills | Status: AC

## 2024-01-20 NOTE — Patient Instructions (Signed)
 Medication Instructions:   Your physician recommends that you continue on your current medications as directed. Please refer to the Current Medication list given to you today.   *If you need a refill on your cardiac medications before your next appointment, please call your pharmacy*   Lab Work: NONE ORDERED  TODAY    If you have labs (blood work) drawn today and your tests are completely normal, you will receive your results only by: MyChart Message (if you have MyChart) OR A paper copy in the mail If you have any lab test that is abnormal or we need to change your treatment, we will call you to review the results.    Testing/Procedures: NONE ORDERED  TODAY    Follow-Up: At [facility], you and your health needs are our priority.  As part of our continuing mission to provide you with exceptional heart care, our providers are all part of one team.  This team includes your primary Cardiologist (physician) and Advanced Practice Providers or APPs (Physician Assistants and Nurse Practitioners) who all work together to provide you with the care you need, when you need it.  Your next appointment:    1 year(s)  Provider:   You may see Manya Sells, MD or one of the following Advanced Practice Providers on your designated Care Team:   Mertha Abrahams, [place]  We recommend signing up for the patient portal called "MyChart".  Sign up information is provided on this After Visit Summary.  MyChart is used to connect with patients for Virtual Visits (Telemedicine).  Patients are able to view lab/test results, encounter notes, upcoming appointments, etc.  Non-urgent messages can be sent to your provider as well.   To learn more about what you can do with MyChart, go to [email or web address].   Other Instructions

## 2024-02-13 ENCOUNTER — Other Ambulatory Visit

## 2024-05-11 ENCOUNTER — Emergency Department (HOSPITAL_BASED_OUTPATIENT_CLINIC_OR_DEPARTMENT_OTHER)
Admission: EM | Admit: 2024-05-11 | Discharge: 2024-05-11 | Disposition: A | Discharge status: Home / Self Care | Source: Ambulatory Visit | Attending: Emergency Medicine | Admitting: Emergency Medicine

## 2024-05-11 ENCOUNTER — Emergency Department (HOSPITAL_BASED_OUTPATIENT_CLINIC_OR_DEPARTMENT_OTHER)

## 2024-05-11 DIAGNOSIS — Z7901 Long term (current) use of anticoagulants: Secondary | ICD-10-CM | POA: Diagnosis not present

## 2024-05-11 DIAGNOSIS — R1084 Generalized abdominal pain: Secondary | ICD-10-CM | POA: Insufficient documentation

## 2024-05-11 DIAGNOSIS — I4891 Unspecified atrial fibrillation: Secondary | ICD-10-CM | POA: Insufficient documentation

## 2024-05-11 DIAGNOSIS — X500XXA Overexertion from strenuous movement or load, initial encounter: Secondary | ICD-10-CM | POA: Diagnosis not present

## 2024-05-11 DIAGNOSIS — R109 Unspecified abdominal pain: Secondary | ICD-10-CM | POA: Diagnosis present

## 2024-05-11 LAB — URINALYSIS, ROUTINE W REFLEX MICROSCOPIC
Bilirubin Urine: NEGATIVE
Glucose, UA: NEGATIVE mg/dL
Hgb urine dipstick: NEGATIVE
Ketones, ur: NEGATIVE mg/dL
Leukocytes,Ua: NEGATIVE
Nitrite: NEGATIVE
Protein, ur: NEGATIVE mg/dL
Specific Gravity, Urine: 1.02 (ref 1.005–1.030)
pH: 6.5 (ref 5.0–8.0)

## 2024-05-11 LAB — CBC
HCT: 42.8 % (ref 39.0–52.0)
Hemoglobin: 14.6 g/dL (ref 13.0–17.0)
MCH: 30 pg (ref 26.0–34.0)
MCHC: 34.1 g/dL (ref 30.0–36.0)
MCV: 88.1 fL (ref 80.0–100.0)
Platelets: 213 K/uL (ref 150–400)
RBC: 4.86 MIL/uL (ref 4.22–5.81)
RDW: 13.2 % (ref 11.5–15.5)
WBC: 8.2 K/uL (ref 4.0–10.5)
nRBC: 0 % (ref 0.0–0.2)

## 2024-05-11 LAB — COMPREHENSIVE METABOLIC PANEL WITH GFR
ALT: 12 U/L (ref 0–44)
AST: 19 U/L (ref 15–41)
Albumin: 4.4 g/dL (ref 3.5–5.0)
Alkaline Phosphatase: 50 U/L (ref 38–126)
Anion gap: 14 (ref 5–15)
BUN: 14 mg/dL (ref 8–23)
CO2: 22 mmol/L (ref 22–32)
Calcium: 10 mg/dL (ref 8.9–10.3)
Chloride: 103 mmol/L (ref 98–111)
Creatinine, Ser: 0.95 mg/dL (ref 0.61–1.24)
GFR, Estimated: >60 mL/min (ref >60)
Glucose, Bld: 103 mg/dL — ABNORMAL HIGH (ref 70–99)
Potassium: 4.3 mmol/L (ref 3.5–5.1)
Sodium: 139 mmol/L (ref 135–145)
Total Bilirubin: 0.8 mg/dL (ref 0.0–1.2)
Total Protein: 6.9 g/dL (ref 6.5–8.1)

## 2024-05-11 LAB — LIPASE, BLOOD: Lipase: 37 U/L (ref 11–51)

## 2024-05-11 MED ORDER — IOHEXOL 300 MG/ML  SOLN
100.0000 mL | Freq: Once | INTRAMUSCULAR | Status: AC | PRN
  Administered 2024-05-11: 100 mL via INTRAVENOUS

## 2024-05-11 NOTE — ED Notes (Signed)
 Patient transported to CT

## 2024-05-11 NOTE — Discharge Instructions (Signed)
 You were evaluated in the emergency department for abdominal pain.  You were evaluated with complete blood count and complete metabolic panel and lipase which were all normal.  The CT scan shows no evidence of acute abnormalities in your abdomen which means that there is no definite source of your pain.  There is moderate diverticulosis but no evidence of acute diverticulitis.  There is a 1.1 cm simple cyst over the lower pole of the right kidney.  Please follow-up with your doctor for recommendations for further imaging of this. Please return if you are having any worsening symptoms especially increased pain, fever, nausea, vomiting, or diarrhea or other new symptoms

## 2024-05-11 NOTE — ED Provider Notes (Signed)
 [name] AT [facility] Provider Note   CSN: [id number] Arrival date & time: 05/11/24  1214     Patient presents with: Abdominal Pain   Juan Shaffer is a 83 y.o. male.   HPI 83 year old male history of A-fib on chronic anticoagulation, hyperlipidemia, history of thrombocytopenia, presents today complaining of right sided abdominal pain for 4 to 6 weeks.  He describes it as a vague and aching discomfort approximately 3 inches below the right rib cage in the front.  He states it began when he was lifting weights.  He was laying on a bench.  He does not think there was any definite injury at that time.  It has come and gone but he notices it most nights when he is trying to go to sleep.  It is usually a mild pain.  It is not worsened with eating, drinking, movement, there is no associated nausea vomiting or diarrhea.  He spoke with his primary care office and was started instructed to come in the ED with reports that they were concerned about cardiac etiologies    Prior to Admission medications   Medication Sig Start Date End Date Taking? Authorizing Provider  alendronate (FOSAMAX) 70 MG tablet Take 70 mg by mouth once a week. 07/07/20   [provider]  apixaban  (ELIQUIS ) 5 MG TABS tablet Take 1 tablet (5 mg total) by mouth 2 (two) times daily. 01/20/24   Ursuy, Renee Lynn, PA-C  atenolol  (TENORMIN ) 50 MG tablet Take 1 tablet (50 mg total) by mouth 2 (two) times daily. 01/20/24   Ursuy, Renee Lynn, PA-C  Cholecalciferol (VITAMIN D3) 1000 units CAPS Take by mouth daily.     [provider]  clobetasol cream (TEMOVATE) 0.05 % Apply 1 application topically as directed.  09/16/16   [provider]  glucosamine-chondroitin 500-400 MG tablet Take 1 tablet by mouth daily.    [provider]  Multiple Vitamin (MULTIVITAMIN) tablet Take by mouth.    [provider]  Multiple Vitamins-Minerals (PRESERVISION AREDS 2 PO) Take  1 capsule by mouth daily at 6 (six) AM.    [provider]  rosuvastatin (CRESTOR) 5 MG tablet Take 5 mg by mouth daily. 04/12/22   [provider]  tadalafil (CIALIS) 20 MG tablet TAKE 1 TABLET BY MOUTH EVERY DAY AS NEEDED FOR UP TO 30 DAYS FOR ERECTILE DYSFUNCTION 12/13/16   [provider]    Allergies: Patient has no known allergies.    Review of Systems  Updated Vital Signs BP (!) 120/39   Pulse 83   Temp 98.5 F (36.9 C) (Oral)   Resp 16   SpO2 98%   Physical Exam Vitals and nursing note reviewed.  Constitutional:      Appearance: He is well-developed.  HENT:     Head: Normocephalic and atraumatic.     Mouth/Throat:     Mouth: Mucous membranes are moist.  Eyes:     Extraocular Movements: Extraocular movements intact.  Cardiovascular:     Rate and Rhythm: Normal rate. Rhythm irregular.  Pulmonary:     Effort: Pulmonary effort is normal.     Breath sounds: Normal breath sounds.  Abdominal:     General: Abdomen is flat. Bowel sounds are normal.     Palpations: Abdomen is soft.  Skin:    General: Skin is warm and dry.     Capillary Refill: Capillary refill takes less than 2 seconds.  Neurological:     General: No  focal deficit present.     Mental Status: He is alert.  Psychiatric:        Mood and Affect: Mood normal.     (all labs ordered are listed, but only abnormal results are displayed) Labs Reviewed  COMPREHENSIVE METABOLIC PANEL WITH GFR - Abnormal; Notable for the following components:      Result Value   Glucose, Bld 103 (*)    All other components within normal limits  LIPASE, BLOOD  CBC  URINALYSIS, ROUTINE W REFLEX MICROSCOPIC    EKG: EKG Interpretation Date/Time:  Friday May 11 2024 12:33:35 EDT Ventricular Rate:  75 PR Interval:    QRS Duration:  80 QT Interval:  412 QTC Calculation: 460 R Axis:   -27  Text Interpretation: Atrial fibrillation Abnormal ECG When compared with ECG of 20-Jan-2024 09:11, No  significant change was found Confirmed by Levander Houston [phone]) on 05/11/2024 1:52:54 PM  Radiology: CT ABDOMEN PELVIS W CONTRAST Result Date: 05/11/2024 CLINICAL DATA:  Acute generalized abdominal pain. EXAM: CT ABDOMEN AND PELVIS WITH CONTRAST TECHNIQUE: Multidetector CT imaging of the abdomen and pelvis was performed using the standard protocol following bolus administration of intravenous contrast. RADIATION DOSE REDUCTION: This exam was performed according to the departmental dose-optimization program which includes automated exposure control, adjustment of the mA and/or kV according to patient size and/or use of iterative reconstruction technique. CONTRAST:  OMNIPAQUE  IOHEXOL  300 MG/ML  SOLN COMPARISON:  CT abdomen/pelvis 11/22/2014, chest CT 06/16/2023 FINDINGS: Lower chest: Lung bases are clear. Mild calcified plaque over the descending thoracic aorta. Hepatobiliary: Liver, gallbladder and biliary tree are normal. Pancreas: Normal. Spleen: Normal. Adrenals/Urinary Tract: Adrenal glands are normal. Kidneys are normal size without hydronephrosis or nephrolithiasis. 1.1 cm simple cyst with Hounsfield unit measurements less than 10 with the lower pole of the right kidney. Further imaging follow-up recommended. Ureters and bladder are normal. Stomach/Bowel: Stomach is within normal. Small bowel is normal in caliber. There are a few mildly prominent fluid-filled closely apposed small bowel loops in the left mid abdomen without significant wall thickening, dilatation or adjacent fluid. Appendix is normal. There is moderate diverticulosis of the sigmoid colon without evidence of active inflammation. Vascular/Lymphatic: Significant calcified plaque over the abdominal aorta which is normal in caliber. No adenopathy. Reproductive: Evidence of previous prostatectomy. Other: No free fluid or focal inflammatory change. Tiny umbilical hernia containing only peritoneal fat. Musculoskeletal: Moderate spondylosis of  the spine with mild-to-moderate osteoarthritic change of the hips. IMPRESSION: 1. No acute findings in the abdomen/pelvis. 2. Moderate diverticulosis of the sigmoid colon without evidence of active inflammation. 3. 1.1 cm simple cyst over the lower pole of the right kidney. Further imaging follow-up recommended. 4. Aortic atherosclerosis. Aortic Atherosclerosis (ICD10-I70.0). Electronically Signed   By: Toribio Agreste M.D.   On: 05/11/2024 14:49     Procedures   Medications Ordered in the ED  iohexol  (OMNIPAQUE ) 300 MG/ML solution 100 mL (100 mLs Intravenous Contrast Given 05/11/24 1411)                                    Medical Decision Making Amount and/or Complexity of Data Reviewed Labs: ordered. Radiology: ordered.  Risk Prescription drug management.   30 male presents today complaining of several weeks of right-sided abdominal pain.  He has not had associated symptoms with no nausea, vomiting, diarrhea.  He is really unable to tell me anything that definitively makes it worse.  His abdomen is soft and nontender on my exam. Patient is evaluated with CBC and complete metabolic panel which are normal. Urinalysis is obtained that shows no evidence of hematuria or infection. Lipase was obtained and is normal. CT was obtained and shows no evidence of acute findings in the abdomen or pelvis.  He does have moderate diverticulosis and a 1.17 cm simple cyst of the lower pole of the right kidney with no definitive source of pain.  Suspect that this pain may be musculoskeletal as it did began while working out. Patient advised of above findings.  He is advised to follow-up with his primary care doctor next week.  He is advised to return if his symptoms are worsening, fever, or new symptoms such as nausea or vomiting.    Final diagnoses:  Abdominal pain, unspecified abdominal location    ED Discharge Orders     None          Levander Houston, MD 05/11/24 1511

## 2024-05-11 NOTE — ED Triage Notes (Signed)
 Patient states RUQ abdominal pain for the past 4-6 weeks. States PCP sent him to the ER to get an EKG.

## 2024-06-20 ENCOUNTER — Ambulatory Visit (INDEPENDENT_AMBULATORY_CARE_PROVIDER_SITE_OTHER): Admitting: Neurology

## 2024-06-20 ENCOUNTER — Encounter: Payer: Self-pay | Admitting: Neurology

## 2024-06-20 VITALS — BP 130/70 | HR 70 | Ht 69.0 in | Wt 161.5 lb

## 2024-06-20 DIAGNOSIS — R296 Repeated falls: Secondary | ICD-10-CM

## 2024-06-20 DIAGNOSIS — I639 Cerebral infarction, unspecified: Secondary | ICD-10-CM

## 2024-06-20 NOTE — Progress Notes (Signed)
 [facility]  PATIENT: Juan Shaffer DOB: 23-Apr-1941  REQUESTING CLINICIAN: Delilah Murray HERO., MD HISTORY FROM: Patient  REASON FOR VISIT: Right frontal stroke    HISTORICAL  CHIEF COMPLAINT:  Chief Complaint  Patient presents with   Cerebrovascular Accident    Rm13 F/U States things have been good since last visit here.     INTERVAL HISTORY 06/20/2024 Patient presents today for follow-up, last visit was in October 2023 at that time he presented following a left frontal stroke.  He did well from a stroke perspective, no deficits.  Today he is presenting with a complaint of poor balance.  He report 2 falls in the last year.  Denies any injury.  He described one of the fall coming downstairs and missing a the last step and falling into the foyer and the second fall he tripped while carrying grocery bags.  Again denies any injury from both falls.   HISTORY OF PRESENT ILLNESS:  This is a 83 year old gentleman with past medical history of hypertension, hyperlipidemia and atrial fibrillation who is presenting after found to have a right frontal stroke. Patient reports experience sudden onset of weakness and numbness in the right arm. This occurred end of August while he was at the gym. A week later he told his cardiologist who ordered a MRI Brain and he was found to have a subacute stroke and he was referred here. Currently he denies any neurological deficits. He is on Eliquis  for his Afib but reports at time he has missed his evening dose.  Other than that, he reports being in his normal state of health, denies any weakness, or numbness. No current symptoms.    OTHER MEDICAL CONDITIONS: Atrial fibrillation on Eliquis , Hypertension, Hyperlipidemia   REVIEW OF SYSTEMS: Full 14 system review of systems performed and negative with exception of: As noted in the HPI   ALLERGIES: No Known Allergies  HOME MEDICATIONS: Outpatient Medications Prior to Visit  Medication  Sig Dispense Refill   alendronate (FOSAMAX) 70 MG tablet Take 70 mg by mouth once a week.     apixaban  (ELIQUIS ) 5 MG TABS tablet Take 1 tablet (5 mg total) by mouth 2 (two) times daily. 180 tablet 3   atenolol  (TENORMIN ) 50 MG tablet Take 1 tablet (50 mg total) by mouth 2 (two) times daily. 180 tablet 3   Cholecalciferol (VITAMIN D3) 1000 units CAPS Take by mouth daily.      clobetasol cream (TEMOVATE) 0.05 % Apply 1 application topically as directed.   1   glucosamine-chondroitin 500-400 MG tablet Take 1 tablet by mouth daily.     Multiple Vitamin (MULTIVITAMIN) tablet Take by mouth.     Multiple Vitamins-Minerals (PRESERVISION AREDS 2 PO) Take 1 capsule by mouth daily at 6 (six) AM.     rosuvastatin (CRESTOR) 5 MG tablet Take 5 mg by mouth daily.     tadalafil (CIALIS) 20 MG tablet TAKE 1 TABLET BY MOUTH EVERY DAY AS NEEDED FOR UP TO 30 DAYS FOR ERECTILE DYSFUNCTION     No facility-administered medications prior to visit.    PAST MEDICAL HISTORY: Past Medical History:  Diagnosis Date   Atrial flutter with rapid ventricular response (HCC) 04/30/2015   chads2vasc score is 2   DCM (dilated cardiomyopathy) (HCC)    EF 45%, likely tachycardia mediated   High cholesterol    OSA (obstructive sleep apnea)    mild with AHI 12/hr    Polymyalgia rheumatica    followed by  Dr Everlean   Prostate cancer [facility])     PAST SURGICAL HISTORY: Past Surgical History:  Procedure Laterality Date   ELECTROPHYSIOLOGIC STUDY N/A 05/05/2015   Procedure: A-Flutter Ablation;  Surgeon: Danelle LELON Birmingham, MD;  Location: [facility] INVASIVE CV LAB;  Service: Cardiovascular;  Laterality: N/A;   PROSTATECTOMY  12/2014   TONSILLECTOMY  ~ 1946    FAMILY HISTORY: Family History  Problem Relation Age of Onset   Parkinson's disease Mother    Stroke Father    Arthritis Father    Stroke Paternal Grandfather    Stroke Other     SOCIAL HISTORY: Social History   Socioeconomic History   Marital status: Married    Spouse  name: Not on file   Number of children: Not on file   Years of education: Not on file   Highest education level: Not on file  Occupational History   Not on file  Tobacco Use   Smoking status: Former    Current packs/day: 0.00    Average packs/day: 1 pack/day for 8.0 years (8.0 ttl pk-yrs)    Types: Cigarettes    Start date: 10/22/1959    Quit date: 10/22/1967    Years since quitting: 56.7   Smokeless tobacco: Never  Substance and Sexual Activity   Alcohol use: Yes    Alcohol/week: 6.0 standard drinks of alcohol    Types: 3 Glasses of wine, 3 Shots of liquor per week   Drug use: No   Sexual activity: Not Currently  Other Topics Concern   Not on file  Social History Narrative   Lives in [place]   Owns a photographer business   Right handed    Social Drivers of Health   Financial Resource Strain: Not on file  Food Insecurity: Low Risk  (06/17/2023)   Received from [facility]   Hunger Vital Sign    Within the past 12 months, you worried that your food would run out before you got money to buy more: Never true    Within the past 12 months, the food you bought just didn't last and you didn't have money to get more. : Never true  Transportation Needs: No Transportation Needs (06/17/2023)   Received from [facility]    In the past 12 months, has lack of reliable transportation kept you from medical appointments, meetings, work or from getting things needed for daily living? : No  Physical Activity: Not on file  Stress: Not on file  Social Connections: Not on file  Intimate Partner Violence: Not on file    PHYSICAL EXAM  GENERAL EXAM/CONSTITUTIONAL: Vitals:  Vitals:   06/20/24 1329  BP: 130/70  Pulse: 70  SpO2: 98%  Weight: 161 lb 8 oz (73.3 kg)  Height: 5' 9 (1.753 m)   Body mass index is 23.85 kg/m. Wt Readings from Last 3 Encounters:  06/20/24 161 lb 8 oz (73.3 kg)  01/20/24 159 lb 9.6 oz (72.4 kg)  06/02/22 155 lb (70.3 kg)   Patient  is in no distress; well developed, nourished and groomed; neck is supple  MUSCULOSKELETAL: Gait, strength, tone, movements noted in Neurologic exam below  NEUROLOGIC: MENTAL STATUS:      No data to display         awake, alert, oriented to person, place and time recent and remote memory intact normal attention and concentration language fluent, comprehension intact, naming intact fund of knowledge appropriate  CRANIAL NERVE:  2nd, 3rd, 4th, 6th -visual fields full  to confrontation, extraocular muscles intact, no nystagmus 5th - facial sensation symmetric 7th - facial strength symmetric 8th - hearing intact 9th - palate elevates symmetrically, uvula midline 11th - shoulder shrug symmetric 12th - tongue protrusion midline  MOTOR:  normal bulk and tone, full strength in the BUE, BLE. 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   SENSORY:  normal and symmetric to light touch  COORDINATION:  finger-nose-finger, fine finger movements normal  GAIT/STATION:  Normal   DIAGNOSTIC DATA (LABS, IMAGING, TESTING) - I reviewed patient records, labs, notes, testing and imaging myself where available.  Lab Results  Component Value Date   WBC 8.2 05/11/2024   HGB 14.6 05/11/2024   HCT 42.8 05/11/2024   MCV 88.1 05/11/2024   PLT 213 05/11/2024      Component Value Date/Time   NA 139 05/11/2024 1229   NA 140 04/30/2022 0920   K 4.3 05/11/2024 1229   CL 103 05/11/2024 1229   CO2 22 05/11/2024 1229   GLUCOSE 103 (H) 05/11/2024 1229   BUN 14 05/11/2024 1229   BUN 15 04/30/2022 0920   CREATININE 0.95 05/11/2024 1229   CALCIUM  10.0 05/11/2024 1229   PROT 6.9 05/11/2024 1229   ALBUMIN 4.4 05/11/2024 1229   AST 19 05/11/2024 1229   ALT 12 05/11/2024 1229   ALKPHOS 50 05/11/2024 1229   BILITOT 0.8 05/11/2024 1229   GFRNONAA >60 05/11/2024 1229   GFRAA 86 09/01/2020 1526   Lab Results  Component Value Date   CHOL 170  06/02/2022   HDL 80 06/02/2022   LDLCALC 69 06/02/2022   TRIG 119 06/02/2022   CHOLHDL 2.1 06/02/2022   Lab Results  Component Value Date   HGBA1C 6.1 (H) 06/02/2022   No results found for: VITAMINB12 Lab Results  Component Value Date   TSH 3.010 04/19/2019    MRI Brain 05/01/22 1. Focal area of cortical T2 hyperintensity and enhancement in the right frontal middle gyrus. This is consistent with subacute infarct. The time frame of 1 week is consistent. Follow-up MRI of the brain without and with contrast in 2-3 months would be useful to assure expected evolution of this area. 2. No other acute intracranial abnormality. 3. Scattered subcortical T2 hyperintensities are otherwise within normal limits for age. The finding is nonspecific but can be seen in the setting of chronic microvascular ischemia, a demyelinating process such as multiple sclerosis, vasculitis, complicated migraine headaches, or as the sequelae of a prior infectious or inflammatory process.   CTA Head and Neck 06/10/2022 1. No emergent large vessel occlusion or hemodynamically significant stenosis of the head or neck. 2. Old right frontal infarct.   ASSESSMENT AND PLAN  83 y.o. year old male with history of right frontal stroke, hypertension, hyperlipidemia and atrial fibrillation who is presenting with concern about his balance and 2 falls in the past year.  Luckily for patient, no injury following the falls.   He is also doing well from a stroke perspective, has no deficit.  Will refer patient to physical therapy for evaluation.  He will continue his current medications, continue with his exercise regimen and I will see him on an as needed basis.   1. Cerebrovascular accident (CVA), unspecified mechanism (HCC)   2. Recurrent falls       Patient Instructions  Continue with current medications  Continue with your daily physical exercise  Will refer to physical therapy for recurrent fall Continue follow-up  PCP and return as needed  Orders Placed This Encounter  Procedures   Ambulatory referral to Physical Therapy    No orders of the defined types were placed in this encounter.   Return if symptoms worsen or fail to improve.    Pastor Falling, MD 06/20/2024, 2:10 PM  [facility] [address], [address] [place], [place] [address] [phone]

## 2024-06-20 NOTE — Patient Instructions (Signed)
 Continue with current medications  Continue with your daily physical exercise  Will refer to physical therapy for recurrent fall Continue follow-up PCP and return as needed

## 2024-06-21 ENCOUNTER — Telehealth: Payer: Self-pay

## 2024-08-08 ENCOUNTER — Ambulatory Visit

## 2024-08-08 ENCOUNTER — Other Ambulatory Visit: Payer: Self-pay

## 2024-08-08 DIAGNOSIS — M5459 Other low back pain: Secondary | ICD-10-CM | POA: Insufficient documentation

## 2024-08-08 DIAGNOSIS — I639 Cerebral infarction, unspecified: Secondary | ICD-10-CM | POA: Insufficient documentation

## 2024-08-08 DIAGNOSIS — R2681 Unsteadiness on feet: Secondary | ICD-10-CM | POA: Diagnosis present

## 2024-08-08 DIAGNOSIS — R296 Repeated falls: Secondary | ICD-10-CM | POA: Diagnosis not present

## 2024-08-08 NOTE — Therapy (Signed)
 OUTPATIENT PHYSICAL THERAPY NEURO EVALUATION   Patient Name: Juan Shaffer MRN: [id number] DOB:1940-10-03, 83 y.o., male Today's Date: 08/08/2024   PCP: Delilah Murray HERO, MD REFERRING PROVIDER: Gregg Lek, MD  END OF SESSION:  PT End of Session - 08/08/24 0849     Visit Number 1    Number of Visits 5    Date for Recertification  10/17/24   pt reports he will be leaving town for several weeks and will call for return visits   Authorization Type Medicare    Progress Note Due on Visit 10    PT Start Time 0845    PT Stop Time 0930    PT Time Calculation (min) 45 min          Past Medical History:  Diagnosis Date   Atrial flutter with rapid ventricular response (HCC) 04/30/2015   chads2vasc score is 2   DCM (dilated cardiomyopathy) (HCC)    EF 45%, likely tachycardia mediated   High cholesterol    OSA (obstructive sleep apnea)    mild with AHI 12/hr    Polymyalgia rheumatica    followed by Dr Everlean   Prostate cancer [facility])    Past Surgical History:  Procedure Laterality Date   ELECTROPHYSIOLOGIC STUDY N/A 05/05/2015   Procedure: A-Flutter Ablation;  Surgeon: Danelle LELON [place], MD;  Location: [facility];  Service: Cardiovascular;  Laterality: N/A;   PROSTATECTOMY  12/2014   TONSILLECTOMY  ~ 1946   Patient Active Problem List   Diagnosis Date Noted   Paroxysmal atrial fibrillation (HCC) 04/19/2019   Hypertension 04/19/2019   Atrial fibrillation with RVR (HCC) 06/09/2017   Iliotibial band syndrome of both sides 11/25/2016   OSA (obstructive sleep apnea)    Routine history and physical examination of adult 11/05/2015   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/2015   Benign neoplasm of colon 11/04/2015   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   PAT (paroxysmal atrial tachycardia) 11/04/2015   Polyarthralgia 11/04/2015   Skin cysts, generalized 11/04/2015   Acute systolic congestive heart failure (HCC)    Atrial flutter with rapid ventricular response (HCC) 05/01/2015    History of prostate cancer 05/01/2015   Hypercholesterolemia 05/01/2015   Atrial flutter (HCC) 04/30/2015   Prostate cancer (HCC) 12/18/2014    ONSET DATE: 1-2 years  REFERRING DIAG: I63.9 (ICD-10-CM) - Cerebrovascular accident (CVA), unspecified mechanism (HCC) R29.6 (ICD-10-CM) - Recurrent falls  THERAPY DIAG:  Unsteadiness on feet  Other low back pain  Rationale for Evaluation and Treatment: Rehabilitation  SUBJECTIVE:  SUBJECTIVE STATEMENT: Hx of CVA about 1-2 years ago and reports minimal deficits from CVA but over past year has noticed some balance deficits. Denies any numbness/tingling, or focal weakness that is obvious. He maintains an active lifestyle with swimming, cycling, and weight training.  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  He did well from a stroke perspective, no deficits.  Today he is presenting with a complaint of poor balance.  He report 2 falls in the last year.  Denies any injury.  He described one of the fall coming downstairs and missing a the last step and falling into the foyer and the second fall he tripped while carrying grocery bags.  Again denies any injury from both falls  PAIN:  Are you having pain? Has been experiencing some back pain and rates 9/10  PRECAUTIONS: None  RED FLAGS: None No lumbar red flag signs noted  WEIGHT BEARING RESTRICTIONS: No  FALLS: Has patient fallen in last 6 months? No  LIVING ENVIRONMENT: Lives with:  lives with their family Lives in: House/apartment Stairs: inter/exterior Has following equipment at home: None  PLOF: Independent. Still working and traveling for his work.   PATIENT GOALS: improve balance  OBJECTIVE:  Note: Objective measures were completed at Evaluation unless otherwise noted.  DIAGNOSTIC FINDINGS: hx of CVA  COGNITION: Overall cognitive status: Within functional limits for tasks assessed   SENSATION: WFL  COORDINATION: WNL   POSTURE: No Significant postural limitations  LOWER EXTREMITY ROM:      LOWER EXTREMITY MMT:    5/5 to seated resisted tests with exception of left dorsiflexion 4- and right dorsiflexion 4/5  BED MOBILITY:  indep  TRANSFERS: indep  RAMP:  indep  CURB:  indep  STAIRS: Not tested GAIT: Findings: Comments: WNL  FUNCTIONAL TESTS:    [facility] PT Assessment - 08/08/24 0001       Standardized Balance Assessment   Standardized Balance Assessment Five Times Sit to Stand;10 meter walk test;Mini-BESTest    Five times sit to stand comments  11 sec      Mini-BESTest   Sit To Stand Normal: Comes to stand without use of hands and stabilizes independently.    Rise to Toes Moderate: Heels up, but not full range (smaller than when holding hands), OR noticeable instability for 3 s.    Stand on one leg (left) Normal: 20 s.    Stand on one leg (right) Moderate: < 20 s    Stand on one leg - lowest score 1    Compensatory Stepping Correction - Forward Normal: Recovers independently with a single, large step (second realignement is allowed).    Compensatory Stepping Correction - Backward Normal: Recovers independently with a single, large step    Compensatory Stepping Correction - Left Lateral Normal: Recovers independently with 1 step (crossover or lateral OK)    Compensatory Stepping Correction - Right Lateral Normal: Recovers independently with 1 step (crossover or lateral OK)    Stepping Corredtion Lateral - lowest score 2     Stance - Feet together, eyes open, firm surface  Normal: 30s    Stance - Feet together, eyes closed, foam surface  Moderate: < 30s    Incline -  Eyes Closed Normal: Stands independently 30s and aligns with gravity    Change in Gait Speed Normal: Significantly changes walkling speed without imbalance    Walk with head turns - Horizontal Moderate: performs head turns with reduction in gait speed.    Walk with pivot turns Normal: Turns with feet close FAST (< 3 steps) with good balance.    Step over obstacles Normal: Able to step over box with minimal change of gait speed and with good balance.    Timed UP & GO with Dual Task Normal: No noticeable change in sitting, standing or walking while backward counting when compared to TUG without    Mini-BEST total score 24           M-CTSIB  Condition 1: Firm Surface, EO 30 Sec, Normal Sway  Condition 2: Firm Surface, EC 30 Sec, Normal and Mild Sway  Condition 3: Foam Surface, EO 30 Sec, Normal and Mild Sway  Condition 4: Foam Surface, EC 10 Sec, Moderate and Severe Sway    PATIENT SURVEYS:  Modified Oswestry Disability Index: 18/50                                                                                                                              TREATMENT DATE: 08/08/24    PATIENT EDUCATION: Education details: assessment findings, HEP initiated Person educated: Patient Education method: Explanation Education comprehension: verbalized understanding  HOME EXERCISE PROGRAM: Access Code: [email or web address] URL: [email or web address] Date: 08/08/2024 Prepared by: Kelly Laurna Shetley  Exercises - Corner Balance Feet Together With Eyes Closed  - 1 x daily - 7 x weekly - 3 sets - 30 sec hold - Corner Balance Feet Together: Eyes Open With Head Turns  - 1 x daily - 7 x weekly - 3 sets - 30 sec hold - Corner Balance Feet Together: Eyes Closed With Head Turns  - 1 x daily - 7 x weekly - 3 sets - 30 sec hold  GOALS: Goals reviewed with  patient? Yes  SHORT TERM GOALS: Target date: same as LTG    LONG TERM GOALS: Target date: 10/17/2024    Patient will be independent in HEP to improve functional outcomes Baseline:  Goal status: INITIAL  2.  Improve postural control per mild sway x 30 sec condition 4 M-CTSIB Baseline: 10 sec severe Goal status: INITIAL  3.  Demo improved balance per score 28/28 Mini-BESTest Baseline: 24/28 Goal status: INITIAL  4.  Back pain TBD  Baseline: 9/10  Goal status: INITIAL  ASSESSMENT:  CLINICAL IMPRESSION: Patient is a 83 y.o. male who was seen today for physical therapy evaluation and treatment for I63.9 (ICD-10-CM) - Cerebrovascular accident (CVA), unspecified mechanism (HCC) R29.6 (ICD-10-CM) - Recurrent falls.  Exhibits good BLE strength with exception of bilat ankle dorsiflexion but no foot drop/drag noted in gait.  Outcome measures revealing for low risk for falls. Difficulty noted with multisensory balance with LOB during condition 4 M-CTSIB.  Initiated HEP to address  deficits and would benefit from ongoing sessions to advance POC details to improve mobility and prevent falls.    Recently he experienced onset of acute low back pain and rates pain to be 9/10 in lumbar region and Oswestry Back Index score 18/50.  Unable to assess on this date and will assess at later appointment if interested/necessary  OBJECTIVE IMPAIRMENTS: decreased activity tolerance, decreased balance, decreased ROM, decreased strength, and pain.   ACTIVITY LIMITATIONS: carrying, lifting, and bending  PARTICIPATION LIMITATIONS: meal prep, cleaning, laundry, community activity, occupation, and yard work  PERSONAL FACTORS: Age, Time since onset of injury/illness/exacerbation, and 1 comorbidity: hx of CVA are also affecting patient's functional outcome.   REHAB POTENTIAL: Excellent  CLINICAL DECISION MAKING: Evolving/moderate complexity  EVALUATION COMPLEXITY: Moderate  PLAN:  PT FREQUENCY:  1x/week  PT DURATION: 4 weeks  PLANNED INTERVENTIONS: 97750- Physical Performance Testing, 97110-Therapeutic exercises, 97530- Therapeutic activity, [id number]- Neuromuscular re-education, 97535- Self Care, [id number]- Manual therapy, [id number]- Gait training, [phone]- Canalith repositioning, [id number]- Aquatic Therapy, [id number]- Electrical stimulation (unattended), 20560 (1-2 muscles), 20561 (3+ muscles)- Dry Needling, and Patient/Family education  PLAN FOR NEXT SESSION: HEP review, back pain?   11:46 AM, 08/08/2024 M. Kelly Adams Hinch, PT, DPT Physical Therapist- [place] Office Number: [phone]
